# Patient Record
Sex: Female | Born: 1945 | Race: White | Hispanic: No | Smoking: Never smoker
Health system: Southern US, Community
[De-identification: ages and names within clinical notes are randomized; demographics above are authoritative.]

## PROBLEM LIST (undated history)

## (undated) DIAGNOSIS — M797 Fibromyalgia: Secondary | ICD-10-CM

## (undated) DIAGNOSIS — G250 Essential tremor: Secondary | ICD-10-CM

## (undated) DIAGNOSIS — T8859XA Other complications of anesthesia, initial encounter: Secondary | ICD-10-CM

## (undated) DIAGNOSIS — I4891 Unspecified atrial fibrillation: Secondary | ICD-10-CM

## (undated) DIAGNOSIS — E669 Obesity, unspecified: Secondary | ICD-10-CM

## (undated) DIAGNOSIS — I10 Essential (primary) hypertension: Secondary | ICD-10-CM

## (undated) DIAGNOSIS — E785 Hyperlipidemia, unspecified: Secondary | ICD-10-CM

## (undated) DIAGNOSIS — K449 Diaphragmatic hernia without obstruction or gangrene: Secondary | ICD-10-CM

## (undated) DIAGNOSIS — T4145XA Adverse effect of unspecified anesthetic, initial encounter: Secondary | ICD-10-CM

## (undated) DIAGNOSIS — K219 Gastro-esophageal reflux disease without esophagitis: Secondary | ICD-10-CM

## (undated) HISTORY — DX: Gastro-esophageal reflux disease without esophagitis: K21.9

## (undated) HISTORY — DX: Diaphragmatic hernia without obstruction or gangrene: K44.9

## (undated) HISTORY — DX: Essential tremor: G25.0

## (undated) HISTORY — DX: Hyperlipidemia, unspecified: E78.5

## (undated) HISTORY — DX: Essential (primary) hypertension: I10

## (undated) HISTORY — DX: Fibromyalgia: M79.7

## (undated) HISTORY — DX: Obesity, unspecified: E66.9

## (undated) HISTORY — PX: ABDOMINAL HYSTERECTOMY: SHX81

---

## 1999-03-22 ENCOUNTER — Other Ambulatory Visit: Admission: RE | Admit: 1999-03-22 | Discharge: 1999-03-22 | Payer: Self-pay | Admitting: Radiology

## 1999-03-22 ENCOUNTER — Encounter (INDEPENDENT_AMBULATORY_CARE_PROVIDER_SITE_OTHER): Payer: Self-pay | Admitting: Specialist

## 2001-09-30 ENCOUNTER — Other Ambulatory Visit: Admission: RE | Admit: 2001-09-30 | Discharge: 2001-09-30 | Payer: Self-pay | Admitting: Family Medicine

## 2001-10-06 ENCOUNTER — Encounter: Admission: RE | Admit: 2001-10-06 | Discharge: 2002-01-04 | Payer: Self-pay | Admitting: Family Medicine

## 2002-02-03 ENCOUNTER — Ambulatory Visit (HOSPITAL_COMMUNITY): Admission: RE | Admit: 2002-02-03 | Discharge: 2002-02-03 | Payer: Self-pay | Admitting: Family Medicine

## 2002-02-03 ENCOUNTER — Encounter: Payer: Self-pay | Admitting: Family Medicine

## 2002-02-07 ENCOUNTER — Encounter: Payer: Self-pay | Admitting: Family Medicine

## 2002-02-07 ENCOUNTER — Ambulatory Visit (HOSPITAL_COMMUNITY): Admission: RE | Admit: 2002-02-07 | Discharge: 2002-02-07 | Payer: Self-pay | Admitting: Family Medicine

## 2002-04-06 ENCOUNTER — Encounter: Admission: RE | Admit: 2002-04-06 | Discharge: 2002-05-05 | Payer: Self-pay | Admitting: Neurosurgery

## 2003-01-12 ENCOUNTER — Inpatient Hospital Stay (HOSPITAL_COMMUNITY): Admission: AD | Admit: 2003-01-12 | Discharge: 2003-01-14 | Payer: Self-pay | Admitting: Cardiology

## 2004-07-21 ENCOUNTER — Other Ambulatory Visit: Admission: RE | Admit: 2004-07-21 | Discharge: 2004-07-21 | Payer: Self-pay | Admitting: Family Medicine

## 2004-08-09 ENCOUNTER — Ambulatory Visit: Payer: Self-pay | Admitting: Cardiology

## 2004-08-24 ENCOUNTER — Ambulatory Visit: Payer: Self-pay | Admitting: Cardiology

## 2007-04-16 ENCOUNTER — Ambulatory Visit: Payer: Self-pay | Admitting: Cardiology

## 2007-10-31 ENCOUNTER — Ambulatory Visit: Payer: Self-pay | Admitting: Vascular Surgery

## 2008-01-30 ENCOUNTER — Ambulatory Visit: Payer: Self-pay | Admitting: Vascular Surgery

## 2008-02-25 ENCOUNTER — Ambulatory Visit: Payer: Self-pay | Admitting: Vascular Surgery

## 2008-03-01 ENCOUNTER — Ambulatory Visit: Payer: Self-pay | Admitting: Vascular Surgery

## 2008-04-30 ENCOUNTER — Ambulatory Visit: Payer: Self-pay | Admitting: Vascular Surgery

## 2008-12-01 ENCOUNTER — Ambulatory Visit: Payer: Self-pay | Admitting: Cardiology

## 2008-12-01 DIAGNOSIS — E669 Obesity, unspecified: Secondary | ICD-10-CM

## 2008-12-01 DIAGNOSIS — E785 Hyperlipidemia, unspecified: Secondary | ICD-10-CM

## 2008-12-01 DIAGNOSIS — R9431 Abnormal electrocardiogram [ECG] [EKG]: Secondary | ICD-10-CM | POA: Insufficient documentation

## 2008-12-01 DIAGNOSIS — I10 Essential (primary) hypertension: Secondary | ICD-10-CM | POA: Insufficient documentation

## 2009-10-19 ENCOUNTER — Ambulatory Visit: Payer: Self-pay | Admitting: Cardiology

## 2009-11-02 ENCOUNTER — Encounter: Payer: Self-pay | Admitting: Cardiology

## 2010-03-08 ENCOUNTER — Encounter: Payer: Self-pay | Admitting: Gastroenterology

## 2010-03-14 NOTE — Assessment & Plan Note (Signed)
Summary: [facility]   Visit Type:  Follow-up Primary Provider:  Dr. Christell Constant  CC:  chest pain.  History of Present Illness: The patient presents for evaluation of chest discomfort. She was at [facility] emergency room yesterday because of this. She has had an abnormal EKG in the past but had a negative stress perfusion study in 2004. This weekend she ate poorly including drinking orange juice and eating sausage. Early yesterday morning she developed chest discomfort. It is a burning discomfort. Her arms became known. She had some tachycardia and became anxious. She presented to the emergency room where she was not felt to be having an acute coronary event. She was not admitted. Since then prior to this time she's had no chest discomfort. She has been working at [employer] and walking for exercise. She cannot bring on chest pressure, neck or arm discomfort. She's had no palpitations, presyncope or syncope. She's had no PND or orthopnea.  Current Medications (verified): 1)  None  Allergies (verified): 1)  ! Penicillin 2)  ! Sulfa 3)  ! * Ivp Dye  Past History:  Past Medical History:  1. Fibromyalgia that is not currently under active treatment.  2. She has been told of GERD and a hiatal hernia in the past after upper     endoscopy.  She was treated with medical therapy for awhile, but this     eventually was discontinued. 3. Hypertension  4. Dyslipidemia  5. Obesity  Past Surgical History: Reviewed history from 12/01/2008 and no changes required. Hysterectomy.  Review of Systems       As stated in the HPI and negative for all other systems.   Vital Signs:  Patient profile:   65 year old female Height:      70 inches Weight:      212 pounds BMI:     30.53 Pulse rate:   69 / minute Resp:     16 per minute BP sitting:   122 / 76  (right arm)  Vitals Entered By: Marrion Coy, CNA (October 19, 2009 10:29 AM)  Physical Exam  General:  Well developed, well  nourished, in no acute distress. Head:  normocephalic and atraumatic Eyes:  PERRLA/EOM intact; conjunctiva and lids normal. Mouth:  Edentulousl. Oral mucosa normal. Neck:  Neck supple, no JVD. No masses, thyromegaly or abnormal cervical nodes. Chest Wall:  no deformities or breast masses noted Lungs:  Clear bilaterally to auscultation and percussion. Abdomen:  Bowel sounds positive; abdomen soft and non-tender without masses, organomegaly, or hernias noted. No hepatosplenomegaly. Msk:  Back normal, normal gait. Muscle strength and tone normal. Extremities:  No clubbing or cyanosis. Neurologic:  Alert and oriented x 3. Skin:  Intact without lesions or rashes. Cervical Nodes:  no significant adenopathy Inguinal Nodes:  no significant adenopathy Psych:  Normal affect.   Detailed Cardiovascular Exam  Neck    Carotids: Carotids full and equal bilaterally without bruits.      Neck Veins: Normal, no JVD.    Heart    Inspection: no deformities or lifts noted.      Palpation: normal PMI with no thrills palpable.      Auscultation: regular rate and rhythm, S1, S2 without murmurs, rubs, gallops, or clicks.    Vascular    Abdominal Aorta: no palpable masses, pulsations, or audible bruits.      Femoral Pulses: normal femoral pulses bilaterally.      Pedal Pulses: normal pedal pulses bilaterally.  Radial Pulses: normal radial pulses bilaterally.      Peripheral Circulation: no clubbing, cyanosis, or edema noted with normal capillary refill.     EKG  Procedure date:  10/19/2009  Findings:      Sinus rhythm, rate 69, axis within normal limits, intervals within normal limits, inferolateral T-wave inversions unchanged from previous EKGs.  Impression & Recommendations:  Problem # 1:  ABNORMAL ELECTROCARDIOGRAM (ICD-794.31) The patient continues to have an EKG that is abnormal and has been noted previously. However, she did have chest discomfort. Her mother had a micro-infarction in her  31s. Given this I would be extra vigilant. I think an exercise treadmill test is indicated. The patient will otherwise continue with risk reduction. Orders: EKG w/ Interpretation (93000)  Problem # 2:  OBESITY, UNSPECIFIED (ICD-278.00) She understands the need to lose weight with diet and exercise and we discussed this today.  Problem # 3:  HYPERTENSION, BENIGN (ICD-401.1) Her blood pressure is slightly elevated today. However, to the 120s over 70s at home. I asked her to keep a watch on this to make sure that it stays consistently in an acceptable range.  Problem # 4:  DYSLIPIDEMIA (ICD-272.4) She did have an LDL was elevated in the 130s. However, she had a very good response to diet and is currently being followed by Dr. Christell Constant.  Patient Instructions: 1)  Your physician recommends that you schedule a follow-up appointment as needed 2)  Your physician recommends that you continue on your current medications as directed. Please refer to the Current Medication list given to you today. 3)  Your physician has requested that you have an exercise tolerance test.  Please have this scheduled at [facility]

## 2010-03-14 NOTE — Letter (Signed)
Summary: [facility]   [facility]   Imported By: Marylou Mccoy 12/08/2009 08:39:20  _____________________________________________________________________  External Attachment:    Type:   Image     Comment:   External Document

## 2010-03-16 NOTE — Letter (Signed)
Summary: Colonoscopy Date Change Letter  [facility]  [address] [place], [place] [address]   Phone: [phone]  Fax: [phone]      March 08, 2010 MRN: [id number]   [facility] [address] [place], [place]  [address]   Dear Ms. Kwasny,   Previously you were recommended to have a repeat colonoscopy around this time. Your chart was recently reviewed by Dr. Claudette Head of [facility]. Follow up colonoscopy is now recommended in February 2015. This revised recommendation is based on current, nationally recognized guidelines for colorectal cancer screening and polyp surveillance. These guidelines are endorsed by the American Cancer Society, [employer] on Colorectal Cancer as well as numerous other major medical organizations.  Please understand that our recommendation assumes that you do not have any new symptoms such as bleeding, a change in bowel habits, anemia, or significant abdominal discomfort. If you do have any concerning GI symptoms or want to discuss the guideline recommendations, please call to arrange an office visit at your earliest convenience. Otherwise we will keep you in our reminder system and contact you 1-2 months prior to the date listed above to schedule your next colonoscopy.  Thank you,  Judie Petit T. Russella Dar, M.D.  [facility] [phone]

## 2010-06-27 NOTE — Assessment & Plan Note (Signed)
OFFICE VISIT   Alison Garrett, Alison Garrett  DOB:  Jun 11, 1945                                       03/01/2008  [id number]#:[id number]   [name] underwent laser ablation of the right great saphenous vein  with multiple stab phlebectomies by Dr. Colin Benton 1 week ago for painful  varicosities in the right thigh and calf.  She has done well following  the procedure.  She had some mild to moderate discomfort in the thigh at  the laser ablation site as one would expect and that is diminishing  daily.  Her biggest problem has been related to the elastic at the top  of the compression hose which she had a reaction to and developed some  skin breakdown which is now resolving.  She had no distal edema or pain  at the stab phlebectomy sites.  Duplex exam today reveals no evidence of  deep venous obstruction.  Great saphenous vein is occluded down to the  entrance site but there is some mild reflux at the junction into the  lateral branch.   I discussed this finding with her.  She will increase her activity as  tolerated.  Return to Dr. Colin Benton for final check in 2 months.   Alison Garrett Rochester, M.D.  Electronically Signed   JDL/MEDQ  D:  03/01/2008  T:  03/02/2008  Job:  [id number]

## 2010-06-27 NOTE — Procedures (Signed)
DUPLEX DEEP VENOUS EXAM - LOWER EXTREMITY   INDICATION:  Follow up right greater saphenous vein ablation.   HISTORY:  Edema:  Minimal in thigh.  Trauma/Surgery:  Right greater saphenous vein ablation with stab  phlebectomy on 02/25/2008 by Dr. Arbie Cookey.  Pain:  No.  PE:  No.  Previous DVT:  No.  Anticoagulants:  No.  Other:   DUPLEX EXAM:                CFV   SFV   PopV  PTV    GSV                R  L  R  L  R  L  R   L  R  L  Thrombosis    o  o  o     o     o      +  Spontaneous   +  +  +     +     +      0  Phasic        +  +  +     +     +      0  Augmentation  +  +  +     +     +      0  Compressible  +  +  +     +     +      0  Competent     D  +  +     +     +      0   Legend:  + - yes  o - no  p - partial  D - decreased   IMPRESSION:  1. No evidence of deep venous thrombosis in the right lower extremity      or the left common femoral vein.  2. Evidence of ablation without flow in the right greater saphenous      vein from the groin to distal insertion site.  3. Flow and reflux noted in the right saphenofemoral junction and      greater saphenous vein lateral branch.  4. Thrombosed varicosities noted near the medial aspect of the right      knee.    _____________________________  Quita Skye Hart Rochester, M.D.   AS/MEDQ  D:  03/01/2008  T:  03/01/2008  Job:  [id number]

## 2010-06-27 NOTE — Procedures (Signed)
LOWER EXTREMITY VENOUS REFLUX EXAM   INDICATION:  Right leg varicose vein with pain and swelling.   EXAM:  Using color-flow imaging and pulse Doppler spectral analysis, the  right common femoral vein, superficial femoral veins, popliteal,  posterior tibial, greater and lesser saphenous vein are evaluated.  There is evidence suggesting deep venous insufficiency in the right  lower extremity.   The right saphenofemoral junction is not competent.  The right GSV is  not competent, with the caliber as described below.   The right proximal short saphenous vein demonstrates competency.   GSV Diameter (used if found to be incompetent only)                                            Right    Left  Proximal Greater Saphenous Vein           0.58 cm  cm  Proximal-to-mid-thigh                     0.58 cm  cm  Mid thigh                                 0.59 cm  cm  Mid-distal thigh                          0.59 cm  cm  Distal thigh                              0.50 cm  cm  Knee                                      0.32 cm  cm   IMPRESSION:  1. The right greater saphenous vein reflux is identified from mid      thigh and below with a caliber ranging from 0.32 cm to 0.84 cm knee      to groin.  2. Reflux also noted in the right lateral branch of the greater      saphenous vein.  3. The right greater saphenous vein is not aneurysmal.  4. The right greater saphenous vein is not tortuous.  5. The right deep system is not competent.  6. The right lesser saphenous vein is competent.  7. No evidence of DVT noted in the right leg.       ___________________________________________  Larina Earthly, M.D.   MG/MEDQ  D:  01/30/2008  T:  01/30/2008  Job:  [id number]

## 2010-06-27 NOTE — Assessment & Plan Note (Signed)
OFFICE VISIT   SAVHANNA, SLIVA  DOB:  06-02-1945                                       04/30/2008  [id number]#:[id number]   The patient presents today for followup of her right great saphenous  vein laser ablation and stab phlebectomy in her thigh and calf.  The  procedure was on 02/25/2008.  Her postop duplex showed closure of her  saphenous vein 1 week following the procedure.  She is here today for  final followup.  She is quite pleased.  She reports no discomfort  associated with her leg.  She reports improvement in the swelling she  had had preprocedural.  Her phlebectomy sites all look quite good with  resolving subcutaneous fullness.  She is quite pleased with her result.  She will see [place] again on an as needed basis.   Larina Earthly, M.D.  Electronically Signed   TFE/MEDQ  D:  04/30/2008  T:  04/30/2008  Job:  [id number]

## 2010-06-27 NOTE — Assessment & Plan Note (Signed)
OFFICE VISIT   [name], [name]  DOB:  Apr 17, 1945                                       01/30/2008  [id number]#:[id number]   The patient presents today for continued follow-up of her severe venous  reflux and venous varicosities.  She reports that she has not had any  improvement wearing graduated compression garments.  She reports these  actually make things feel worse in her thigh than without wearing them.  She reports that the pain in her legs is interfering with exercise and  her daily walking.  She has had to decrease her frequency and length of  her walking regimen due to the pain.  She also exercises at a health  club and has had to decrease frequency secondary to leg pain.  She  reports that cooking and housework have become difficult due to pain and  swelling and also reports that that she works at [employer] and  this is also difficult due to leg pain and swelling.  She did undergo  formal duplex exam, this confirms reflux throughout her right great  saphenous vein that leads into the marked varicosities in her thigh and  calf.  I have explained the options to the patient.  I have recommended  that we proceed with laser ablation of her right great saphenous vein  and stab phlebectomy of multiple tributary varicosities over her thigh,  knee and calf region.  I explained this is an outpatient procedure in  our office.  I explained it is under local anesthesia.  She understands  and wishes to proceed when we can get her on our schedule.   Larina Earthly, M.D.  Electronically Signed   TFE/MEDQ  D:  01/30/2008  T:  02/02/2008  Job:  [id number]   cc:   Ernestina Penna, M.D.

## 2010-06-27 NOTE — Consult Note (Signed)
NEW PATIENT CONSULTATION   Alison Garrett, Alison S.  DOB:  06-May-1945                                       10/31/2007  [id number]#:[id number]   The patient presents today for evaluation of right leg venous  varicosities.  She is a very pleasant 65 year old white female with a  long history of varicosities over her right pretibial area.  These have  been progressive over time and she is now having increasing pain  associated with this.  She reports this occurs with prolonged standing  and is interfering with daily activities.  She does have a remote  history of what sounds like a ligation of iliac varix in her medial calf  10 years ago.  She denies any history of deep venous thrombosis.  She  does elevate her legs when possible and takes Tylenol over-the-counter  for pain associated with this.   PAST MEDICAL HISTORY:  Significant for myalgia, history atrial  fibrillation, history of hypertension.   SOCIAL HISTORY:  She does not smoke or drink alcohol.   Her weight is reported at 95 pounds.  She is 5 feet 10 inches tall.   REVIEW OF SYSTEMS:  Positive only for leg pain.   PHYSICAL EXAMINATION:  Well-developed, well-nourished white female  appearing stated age 69.  Blood pressure is 150/92 pulse 61,  respirations 18.  She does not have any significant varicosities her  left leg.  She does have marked varicosities in her medial right thigh  and her right pretibial area.   On duplex imaging this clearly arises from her saphenous vein with gross  reflux throughout her saphenous vein and her thigh.  We have discussed  the significance of her venous hypertension with the patient and fitted  her with graduated compression garments.  She was instructed on the  daily use of these and we will see her again in 3 months for continued  followup.  I did discuss the option of laser ablation and stab  phlebectomy for relief of her venous hypertension should conservative  methods of  compression fail.  We will see her again in 3 months with a  formal duplex at that time.   Larina Earthly, M.D.  Electronically Signed   TFE/MEDQ  D:  10/31/2007  T:  11/04/2007  Job:  [id number]   cc:   Ernestina Penna, M.D.

## 2010-06-30 NOTE — H&P (Signed)
Alison Garrett, Alison Garrett                       ACCOUNT NO.:  [id number]   MEDICAL RECORD NO.:  [id number]                   PATIENT TYPE:  INP   LOCATION:  3729                                 FACILITY:  [facility]   PHYSICIAN:  R[place]gway Bing, M.D.               DATE OF BIRTH:  05-28-1945   DATE OF ADMISSION:  01/12/2003  DATE OF DISCHARGE:                                HISTORY & PHYSICAL   REFERRING PHYSICIAN:  Charlesetta Shanks   PRIMARY CARDIOLOGIST:  Dr. Corinda Gubler.   HISTORY OF PRESENT ILLNESS:  A 65 year old woman presenting to her primary  care physician today with chest pain associated with dyspnea.  She was sent  by EMS to the [facility] Emergency Department.   Alison Garrett has a known history of coronary disease.  She experienced a  previous episode of fairly severe chest discomfort in 2001 that prompted an  outpatient Cardiolite study, that was apparently negative.  She experiences  intermittent chest discomfort and myalgias that she attributes to  fibromyalgia and to GERD.  This morning she awoke with mid substernal chest  pressure associated with fairly severe dyspnea.  Her discomfort was  exacerbated by low level activity as she went about her morning routine.  There was no associated nausea nor diaphoresis.  She could not attempt any  treatment at home but presented to her primary care physician, at which time  she had a minor dull residual ache that improved after sublingual  nitroglycerin.  She wonders whether the discomfort was due to GERD and the  dyspnea due to anxiety, which is a chronic problem for her.  She has a  history of hypertension.  She has been told of hyperlipidemia in the past.  She has had no diabetes.  Her lifestyle is sedentary.  She experiences  dyspnea with moderate exertion.   PAST MEDICAL HISTORY:  1. Fibromyalgia that is not currently under active treatment.  2. She has been told of GERD and a hiatal hernia in the past after upper     endoscopy.   She was treated with medical therapy for awhile, but this     eventually was discontinued.   PAST SURGICAL HISTORY:  Her only prior surgery was a hysterectomy.   MEDICATIONS:  Accupril 40 mg daily.  She has somewhat labile that is not  infrequently elevated, at least briefly.   ALLERGIES:  1. SULFA.  2. PENICILLIN.   SOCIAL HISTORY:  She lives in [place].  Unemployed and disabled with two  children.  She does not use tobacco nor alcohol products.   FAMILY HISTORY:  Mother is alive at age of 85 and has a history of coronary  disease.  She previously underwent CABG surgery.  Father died in his 56s due  to COPD.  None of her siblings have coronary disease.   REVIEW OF SYSTEMS:  Notable for occasional palpitations.  Chronic anxiety  with acute exacerbations, chronic  arthralgias.  All other systems negative.   PHYSICAL EXAMINATION:  GENERAL:  A well-appearing somewhat overweight woman.  VITAL SIGNS:  Temperature 98, heart rate 80, respirations 20, blood pressure  150/105.  O2 saturation on 96% on room air.  HEENT:  Nonicteric sclerae.  NECK: No jugular venous distention; no carotid bruits.  ENDOCRINE:  Mild thyromegaly.  HEMATOPOIETIC:  No adenopathy.  SKIN:  No significant lesions.  LUNGS:  Clear.  CARDIAC:  Normal first and second heart sounds; fourth heart sound present.  ABDOMEN:  Soft and nontender; no bruits; no organomegaly.  EXTREMITIES:  No edema; normal distal pulses.  NEUROMUSCULAR:  Symmetric strength and tone.  MUSCULOSKELETAL:  Full range of motion in all joints.   LABORATORY DATA:  Chest x-ray, EKG, and labs are pending.  EKGs from Dr.  Malen Gauze office were viewed.  Her tracing from 2001 is entirely normal.  The  tracings from today are of poorer technical quality with some baseline  drift.  There is some ST segment depression in the anterolateral leads.   IMPRESSION:  Alison Garrett has long standing symptoms that attributed to  fibromyalgia and gastroesophageal  reflux disease.  The quality of chest  discomfort and the associated dyspnea noted today were more consistent with  myocardial ischemia.  She did have minor electrocardiogram abnormalities  when she presented to her primary care physician's office, but these could  be due to tachycardia and hypertension.  Alison Garrett will be admitted for  serial cardiac markers and electrocardiograms.  If negative, we will proceed  with a stress Cardiolite study in the morning.  Metoprolol will be added to  her medical regime for better control of hypertension.  She may require  diuretic therapy as well.  A lipid profile and thyroid stimulating hormone  will be assessed.  We will treat her empirically with a PPI medication.                                                [place] Bing, M.D.    RR/MEDQ  D:  01/12/2003  T:  01/12/2003  Job:  [id number]

## 2010-06-30 NOTE — Discharge Summary (Signed)
Alison Garrett, Alison Garrett                       ACCOUNT NO.:  [id number]   MEDICAL RECORD NO.:  [id number]                   PATIENT TYPE:  INP   LOCATION:  3729                                 FACILITY:  [facility]   PHYSICIAN:  Maple Mirza, P.A.              DATE OF BIRTH:  [birth year]   DATE OF ADMISSION:  01/12/2003  DATE OF DISCHARGE:  01/14/2003                                 DISCHARGE SUMMARY   PRIMARY CAREGIVERS:  [facility] and [facility], [place], [place].   DISCHARGE DIAGNOSIS:  Chest discomfort with negative cardiac enzymes x3, non-  diagnostic electrocardiogram, resolving during this hospitalization after  medical treatment with Lopressor and Protonix.   SECONDARY DIAGNOSES:  1. Dyslipidemia.  2. Hypertension.  3. Family history of coronary artery disease.   PROCEDURES:  None.   DISCHARGE DISPOSITION:  The patient is ready for discharge January 14, 2003.  She has maintained sinus rhythm this hospitalization.  She has had no  cardiac dysrhythmias, no respiratory compromise.  Once again, cardiac  enzymes were taken, troponin I negative x3.  Electrocardiogram shows sinus  rhythm with no ST-T changes, no ST depressions.  Patient has been chest pain  free this hospitalization.  She is scheduled for exercise Cardiolite at  [facility] on January 14, 2003, at 7:15 in the morning so she will  discharge today on:   1. An enteric-coated aspirin 81 mg daily.  2. Accupril 40 mg daily, she takes this already.  3. Protonix 40 mg daily, new medication.  4. Lopressor 50 mg one-half tab three times daily, breakfast, lunch, dinner;     also new medication.  5. She was to be started on Zocor but the patient wishes to try a six-week     trial of exercise, diet and then to have her lipid studies re-taken.   DISCHARGE DIET:  Low-sodium, low-cholesterol.   PLAN:  Present to [facility] at 7:15 in the morning of January 14, 2003, for a  stress Cardiolite.  Patient is ready for discharge January 14, 2003.   BRIEF HISTORY:  This is a 65 year old female.  She has no known history of  coronary artery disease.  She presented to her primary caregiver on January 12, 2003, complaining of chest pain.  She was referred to [facility] and was admitted through the emergency room.  The patient  apparently woke at 5 o'clock in the morning of January 12, 2003, with chest  pain which increased with activity.  The  pain was rated a 6/10.  She did  have increasing dyspnea, no nausea or vomiting, no diaphoresis.  She took a  sublingual nitroglycerin which decreased the pain, this was given at her  family practitioner's office.  [facility].  She does complain of some burning but this is more  consistent with  her experience of reflux.  She had earlier symptoms similar  to angina attacks.  Prior evaluation in 2001 or 2002 was for the same  symptoms.  Apparently, this study was negative.  She says she has a diagnosis of  fibromyalgia which is poorly controlled.  She also has a history of  gastroesophageal reflux disease with hiatal hernia.   Once again, the patient is discharged today to present to [facility]  for a stress Cardiolite study.                                                Maple Mirza, P.A.    GM/MEDQ  D:  01/14/2003  T:  01/14/2003  Job:  [id number]   cc:   Cecil Cranker, M.D.

## 2010-09-07 ENCOUNTER — Encounter: Payer: Self-pay | Admitting: Cardiology

## 2010-09-19 ENCOUNTER — Encounter: Payer: Self-pay | Admitting: Cardiology

## 2011-04-11 ENCOUNTER — Ambulatory Visit: Payer: Medicare Other | Attending: Family Medicine | Admitting: Physical Therapy

## 2011-04-11 DIAGNOSIS — IMO0001 Reserved for inherently not codable concepts without codable children: Secondary | ICD-10-CM | POA: Insufficient documentation

## 2011-04-11 DIAGNOSIS — M542 Cervicalgia: Secondary | ICD-10-CM | POA: Insufficient documentation

## 2011-04-11 DIAGNOSIS — M545 Low back pain, unspecified: Secondary | ICD-10-CM | POA: Insufficient documentation

## 2011-04-11 DIAGNOSIS — M25559 Pain in unspecified hip: Secondary | ICD-10-CM | POA: Insufficient documentation

## 2011-04-11 DIAGNOSIS — R5381 Other malaise: Secondary | ICD-10-CM | POA: Insufficient documentation

## 2011-04-17 ENCOUNTER — Ambulatory Visit: Payer: Medicare Other | Attending: Family Medicine | Admitting: *Deleted

## 2011-04-17 DIAGNOSIS — M542 Cervicalgia: Secondary | ICD-10-CM | POA: Insufficient documentation

## 2011-04-17 DIAGNOSIS — R5381 Other malaise: Secondary | ICD-10-CM | POA: Insufficient documentation

## 2011-04-17 DIAGNOSIS — IMO0001 Reserved for inherently not codable concepts without codable children: Secondary | ICD-10-CM | POA: Insufficient documentation

## 2011-04-17 DIAGNOSIS — M545 Low back pain, unspecified: Secondary | ICD-10-CM | POA: Insufficient documentation

## 2011-04-17 DIAGNOSIS — M25559 Pain in unspecified hip: Secondary | ICD-10-CM | POA: Insufficient documentation

## 2011-04-19 ENCOUNTER — Ambulatory Visit: Payer: Medicare Other | Admitting: *Deleted

## 2011-04-24 ENCOUNTER — Ambulatory Visit: Payer: Medicare Other | Admitting: *Deleted

## 2011-04-26 ENCOUNTER — Ambulatory Visit: Payer: Medicare Other | Admitting: *Deleted

## 2011-05-01 ENCOUNTER — Encounter: Payer: Medicare Other | Admitting: *Deleted

## 2011-05-03 ENCOUNTER — Ambulatory Visit: Payer: Medicare Other | Admitting: *Deleted

## 2011-05-08 ENCOUNTER — Ambulatory Visit: Payer: Medicare Other | Admitting: *Deleted

## 2011-05-10 ENCOUNTER — Ambulatory Visit: Payer: Medicare Other | Admitting: *Deleted

## 2011-05-15 ENCOUNTER — Ambulatory Visit: Payer: Medicare Other | Attending: Family Medicine | Admitting: Physical Therapy

## 2011-05-15 DIAGNOSIS — M542 Cervicalgia: Secondary | ICD-10-CM | POA: Insufficient documentation

## 2011-05-15 DIAGNOSIS — R5381 Other malaise: Secondary | ICD-10-CM | POA: Insufficient documentation

## 2011-05-15 DIAGNOSIS — M545 Low back pain, unspecified: Secondary | ICD-10-CM | POA: Insufficient documentation

## 2011-05-15 DIAGNOSIS — IMO0001 Reserved for inherently not codable concepts without codable children: Secondary | ICD-10-CM | POA: Insufficient documentation

## 2011-05-15 DIAGNOSIS — M25559 Pain in unspecified hip: Secondary | ICD-10-CM | POA: Insufficient documentation

## 2011-05-16 ENCOUNTER — Ambulatory Visit: Payer: Medicare Other | Admitting: Physical Therapy

## 2011-05-22 ENCOUNTER — Encounter: Payer: Medicare Other | Admitting: Physical Therapy

## 2011-05-23 ENCOUNTER — Ambulatory Visit: Payer: Medicare Other | Admitting: Physical Therapy

## 2011-05-30 ENCOUNTER — Ambulatory Visit: Payer: Medicare Other | Admitting: Physical Therapy

## 2011-06-07 ENCOUNTER — Encounter: Payer: Self-pay | Admitting: Cardiology

## 2011-06-07 ENCOUNTER — Other Ambulatory Visit: Payer: Self-pay | Admitting: Cardiology

## 2011-06-07 DIAGNOSIS — I4891 Unspecified atrial fibrillation: Secondary | ICD-10-CM

## 2011-06-20 ENCOUNTER — Other Ambulatory Visit: Payer: Medicare Other

## 2011-07-11 ENCOUNTER — Encounter: Payer: Self-pay | Admitting: Cardiology

## 2011-07-11 ENCOUNTER — Ambulatory Visit (INDEPENDENT_AMBULATORY_CARE_PROVIDER_SITE_OTHER): Payer: Medicare Other | Admitting: Cardiology

## 2011-07-11 VITALS — BP 142/100 | HR 67 | Ht 70.0 in | Wt 215.0 lb

## 2011-07-11 DIAGNOSIS — I480 Paroxysmal atrial fibrillation: Secondary | ICD-10-CM | POA: Insufficient documentation

## 2011-07-11 DIAGNOSIS — I10 Essential (primary) hypertension: Secondary | ICD-10-CM

## 2011-07-11 DIAGNOSIS — I4891 Unspecified atrial fibrillation: Secondary | ICD-10-CM

## 2011-07-11 NOTE — Patient Instructions (Signed)
The current medical regimen is effective;  continue present plan and medications.  Your physician has requested that you have an exercise tolerance test. For further information please visit [email or web address] Please also follow instruction sheet, as given.  Your physician has requested that you have an echocardiogram. Echocardiography is a painless test that uses sound waves to create images of your heart. It provides your doctor with information about the size and shape of your heart and how well your heart's chambers and valves are working. This procedure takes approximately one hour. There are no restrictions for this procedure.  Follow up with Dr Antoine Poche in [place] in 3 months.

## 2011-07-11 NOTE — Progress Notes (Signed)
   HPI The patient presents for evaluation of atrial fibrillation. I have seen her in the past for evaluation of atypical chest pain. She has had a negative stress test several years ago. She did have an episode of atrial fibrillation about 7 years ago.  On April 25 she was in bed and developed a rapid heart rate. She moves atrial fibrillation which was confirmed [place].  She subsequently converted to sinus rhythm and was observed overnight. She's had no further palpitations, presyncope or syncope. She has had no chest pressure, neck or arm discomfort. He's had no shortness of breath, PND or orthopnea. She doesn't exercise but she does have an active job and has no limitations with this.  Of note the patient was seen in consultation by Dr. Andee Lineman.  She was given a prescription for Cardizem to take when necessary and follow up was suggested with [place].  Allergies  Allergen Reactions  . Penicillins   . Sulfonamide Derivatives     No current outpatient prescriptions on file.    Past Medical History  Diagnosis Date  . Fibromyalgia     Not currently under active treatment  . GERD (gastroesophageal reflux disease)   . Hiatal hernia   . Hypertension   . Dyslipidemia   . Obesity     Past Surgical History  Procedure Date  . Abdominal hysterectomy     ROS:  As stated in the HPI and negative for all other systems.  PHYSICAL EXAM BP 142/100  Pulse 67  Ht 5\' 10"  (1.778 m)  Wt 215 lb (97.523 kg)  BMI 30.85 kg/m2 GENERAL:  Well appearing HEENT:  Pupils equal round and reactive, fundi not visualized, oral mucosa unremarkable NECK:  No jugular venous distention, waveform within normal limits, carotid upstroke brisk and symmetric, no bruits, no thyromegaly LYMPHATICS:  No cervical, inguinal adenopathy LUNGS:  Clear to auscultation bilaterally BACK:  No CVA tenderness CHEST:  Unremarkable HEART:  PMI not displaced or sustained,S1 and S2 within normal limits, no S3, no S4, no clicks, no  rubs, no murmurs ABD:  Flat, positive bowel sounds normal in frequency in pitch, no bruits, no rebound, no guarding, no midline pulsatile mass, no hepatomegaly, no splenomegaly EXT:  2 plus pulses throughout, no edema, no cyanosis no clubbing SKIN:  No rashes no nodules NEURO:  Cranial nerves II through XII grossly intact, motor grossly intact throughout PSYCH:  Cognitively intact, oriented to person place and time    EKG:  Sinus rhythm, rate 67, axis within normal limits, intervals within normal limits, no acute ST-T wave changes.  ASSESSMENT AND PLAN

## 2011-07-11 NOTE — Assessment & Plan Note (Signed)
The blood pressure is at target. No change in medications is indicated. We will continue with therapeutic lifestyle changes (TLC).  

## 2011-07-11 NOTE — Assessment & Plan Note (Signed)
Ms. ZASHA BELLEAU has a CHA2DS2 - VASc score of 1 with a risk of stroke of 1.3%  and a HAS - BLED score of 1.with one validation study suggesting a risk of 1.02 bleeds per 100 patient years.  Therefore, anticoagulation is not indicated. She can take the Cardizem as needed. I would likely also give her eventually flecainide as a pill in pocket. However, she needs an echocardiogram and stress test first to make sure she still has a structurally normal heart.  She also understands that the first time she takes this should be in the emergency room.

## 2011-07-18 ENCOUNTER — Ambulatory Visit (HOSPITAL_COMMUNITY): Payer: Medicare Other | Attending: Cardiovascular Disease | Admitting: Radiology

## 2011-07-18 ENCOUNTER — Ambulatory Visit (INDEPENDENT_AMBULATORY_CARE_PROVIDER_SITE_OTHER): Payer: Medicare Other | Admitting: Cardiology

## 2011-07-18 ENCOUNTER — Encounter: Payer: Self-pay | Admitting: Cardiology

## 2011-07-18 DIAGNOSIS — I4891 Unspecified atrial fibrillation: Secondary | ICD-10-CM | POA: Insufficient documentation

## 2011-07-18 DIAGNOSIS — E785 Hyperlipidemia, unspecified: Secondary | ICD-10-CM | POA: Insufficient documentation

## 2011-07-18 NOTE — Progress Notes (Signed)
Echocardiogram performed.  

## 2011-07-18 NOTE — Procedures (Signed)
Exercise Treadmill Test  Pre-Exercise Testing Evaluation Rhythm: normal sinus  Rate: 68   PR:  .16 QRS:  .09  QT:  .40 QTc: .42     Test  Exercise Tolerance Test Ordering MD: Angelina Sheriff, MD  Interpreting MD: Angelina Sheriff, MD  Unique Test No: 1  Treadmill:  1  Indication for ETT: A-Fib  Contraindication to ETT: No   Stress Modality: exercise - treadmill  Cardiac Imaging Performed: non   Protocol: standard Bruce - maximal  Max BP:  166/74  Max MPHR (bpm):  156 85% MPR (bpm):  132  MPHR obtained (bpm):  141 % MPHR obtained:  90  Reached 85% MPHR (min:sec):  5:15 Total Exercise Time (min-sec):  6:15  Workload in METS:  7.1 Borg Scale: 17  Reason ETT Terminated:  desired heart rate attained    ST Segment Analysis At Rest: normal ST segments - no evidence of significant ST depression With Exercise: no evidence of significant ST depression  Other Information Arrhythmia:  No Angina during ETT:  absent (0) Quality of ETT:  diagnostic  ETT Interpretation:  normal - no evidence of ischemia by ST analysis  Comments: The patient had an poor exercise tolerance.  There was no chest pain.  There was an appropriate level of dyspnea.  There were no arrhythmias, a normal heart rate response and normal BP response.  There were no ischemic ST T wave changes and a normal heart rate recovery.   Recommendations: Negative adequate ETT.  No further testing is indicated.  Based on the above I gave the patient a prescription for exercise.  Based on this and the normal echocardiogram, the patient would be appropriate for flecainide "pill in pocket" if she has atrial fibrillation in the future.  This flecainide bolus should first be administered in the ER for observation afterward.  The patient has been given this instruction.

## 2011-07-31 ENCOUNTER — Encounter: Payer: Self-pay | Admitting: Cardiology

## 2011-11-15 ENCOUNTER — Encounter: Payer: Self-pay | Admitting: Cardiology

## 2012-06-04 ENCOUNTER — Other Ambulatory Visit (INDEPENDENT_AMBULATORY_CARE_PROVIDER_SITE_OTHER): Payer: Medicare Other

## 2012-06-04 DIAGNOSIS — Z0289 Encounter for other administrative examinations: Secondary | ICD-10-CM

## 2012-06-04 DIAGNOSIS — E785 Hyperlipidemia, unspecified: Secondary | ICD-10-CM

## 2012-06-04 DIAGNOSIS — I10 Essential (primary) hypertension: Secondary | ICD-10-CM

## 2012-06-04 DIAGNOSIS — R5383 Other fatigue: Secondary | ICD-10-CM

## 2012-06-04 DIAGNOSIS — E559 Vitamin D deficiency, unspecified: Secondary | ICD-10-CM

## 2012-06-04 DIAGNOSIS — R5381 Other malaise: Secondary | ICD-10-CM

## 2012-06-04 LAB — HEPATIC FUNCTION PANEL
ALT: 24 U/L (ref 0–35)
Albumin: 4.1 g/dL (ref 3.5–5.2)
Alkaline Phosphatase: 85 U/L (ref 39–117)
Indirect Bilirubin: 0.6 mg/dL (ref 0.0–0.9)
Total Protein: 7.1 g/dL (ref 6.0–8.3)

## 2012-06-04 LAB — THYROID PANEL WITH TSH: TSH: 1.636 u[IU]/mL (ref 0.350–4.500)

## 2012-06-04 LAB — BASIC METABOLIC PANEL
CO2: 29 mEq/L (ref 19–32)
Calcium: 9.3 mg/dL (ref 8.4–10.5)
Chloride: 107 mEq/L (ref 96–112)
Creat: 0.99 mg/dL (ref 0.50–1.10)
Glucose, Bld: 97 mg/dL (ref 70–99)

## 2012-06-04 LAB — POCT CBC
Lymph, poc: 1.8 (ref 0.6–3.4)
MCH, POC: 29.1 pg (ref 27–31.2)
MPV: 8.1 fL (ref 0–99.8)
POC LYMPH PERCENT: 25.1 %L (ref 10–50)
Platelet Count, POC: 229 10*3/uL (ref 142–424)
RBC: 5.2 M/uL (ref 4.04–5.48)
RDW, POC: 13 %
WBC: 7 10*3/uL (ref 4.6–10.2)

## 2012-06-04 NOTE — Progress Notes (Signed)
Patient here today for labs only. °

## 2012-06-05 LAB — NMR LIPOPROFILE WITH LIPIDS
HDL Particle Number: 26.6 umol/L — ABNORMAL LOW (ref >=30.5)
HDL Size: 8.7 nm — ABNORMAL LOW (ref >=9.2)
HDL-C: 41 mg/dL (ref >=40)
LDL (calc): 110 mg/dL — ABNORMAL HIGH (ref <100)
LDL Size: 20.4 nm — ABNORMAL LOW (ref >20.5)
Large HDL-P: 2.8 umol/L — ABNORMAL LOW (ref >=4.8)
Small LDL Particle Number: 793 nmol/L — ABNORMAL HIGH (ref <=527)

## 2012-06-05 LAB — VITAMIN D 25 HYDROXY (VIT D DEFICIENCY, FRACTURES): Vit D, 25-Hydroxy: 21 ng/mL — ABNORMAL LOW (ref 30–89)

## 2012-06-10 ENCOUNTER — Other Ambulatory Visit: Payer: Self-pay | Admitting: *Deleted

## 2012-06-10 DIAGNOSIS — E559 Vitamin D deficiency, unspecified: Secondary | ICD-10-CM

## 2012-06-10 MED ORDER — ERGOCALCIFEROL 1.25 MG (50000 UT) PO CAPS: 50000.0000 [IU] | ORAL_CAPSULE | ORAL | Status: DC

## 2012-06-10 NOTE — Progress Notes (Signed)
Left message on home voicemail to return call.

## 2012-07-23 ENCOUNTER — Ambulatory Visit (INDEPENDENT_AMBULATORY_CARE_PROVIDER_SITE_OTHER): Payer: Medicare Other | Admitting: Family Medicine

## 2012-07-23 ENCOUNTER — Encounter: Payer: Self-pay | Admitting: Family Medicine

## 2012-07-23 ENCOUNTER — Ambulatory Visit (INDEPENDENT_AMBULATORY_CARE_PROVIDER_SITE_OTHER): Payer: Medicare Other

## 2012-07-23 VITALS — BP 165/95 | HR 84 | Temp 97.0°F | Ht 68.5 in | Wt 219.8 lb

## 2012-07-23 DIAGNOSIS — M25522 Pain in left elbow: Secondary | ICD-10-CM

## 2012-07-23 DIAGNOSIS — M25529 Pain in unspecified elbow: Secondary | ICD-10-CM

## 2012-07-23 DIAGNOSIS — I10 Essential (primary) hypertension: Secondary | ICD-10-CM

## 2012-07-23 DIAGNOSIS — R51 Headache: Secondary | ICD-10-CM

## 2012-07-23 DIAGNOSIS — R42 Dizziness and giddiness: Secondary | ICD-10-CM

## 2012-07-23 DIAGNOSIS — E785 Hyperlipidemia, unspecified: Secondary | ICD-10-CM

## 2012-07-23 MED ORDER — QUINAPRIL HCL 20 MG PO TABS: 20.0000 mg | ORAL_TABLET | Freq: Every day | ORAL | Status: DC

## 2012-07-23 NOTE — Patient Instructions (Addendum)
Continue current meds and therapeutic lifestyle changes Start new medication for blood pressure which will be called into the drug store. Return to clinic in 4 weeks, recheck blood pressure, recheck BMP, recheck elbow pain. Get tennis elbow brace use as directed. Use warm wet compresses on elbows. Can try some ibuprofen 200 mg 1  3 times a day after meal, illicit bother stomach or cause his blood pressure to go up Check blood pressures more regularly at home Watch sodium intake Do the best possible with losing more weight, i.e. Exercise and diet

## 2012-07-23 NOTE — Progress Notes (Addendum)
Subjective:    Patient ID: Alison Garrett, female    DOB: 12/06/1945, 67 y.o.   MRN: [id number]  HPI Patient comes in today for followup of chronic medical problems which include fibromyalgia, hyperlipidemia, and hypertension. As below she is also complaining of some dizziness for the past week which has been intermittent. Also some headaches. As a note on health maintenance patient refuses to take the Pneumovax. Patient also complains of numbness in both hands and pain in both elbows left being greater than right   Review of Systems  Constitutional: Positive for fatigue.  Eyes: Negative.   Respiratory: Negative.   Cardiovascular: Negative.   Gastrointestinal: Negative.   Endocrine: Negative.   Genitourinary: Negative.   Musculoskeletal: Positive for myalgias (fibromyalgia), back pain (LBP) and arthralgias (all over).  Skin: Negative.   Neurological: Positive for dizziness (x 1 week, intermitent) and headaches (slight  pressure, temples).  Psychiatric/Behavioral: Negative.        Objective:   Physical Exam BP 165/95  Pulse 84  Temp(Src) 97 F (36.1 C) (Oral)  Ht 5' 8.5" (1.74 m)  Wt 219 lb 12.8 oz (99.701 kg)  BMI 32.93 kg/m2  Repeat blood pressure this morning was 154/100  The patient appeared well nourished and normally developed, alert and oriented to time and place. Speech, behavior and judgement appear normal. Vital signs as documented.  Head exam is unremarkable. No scleral icterus or pallor noted. Some nasal congestion bilaterally. Prominent tonsils bilaterally. TMs were normal.  Neck is without jugular venous distension, thyromegally, or carotid bruits. Carotid upstrokes are brisk bilaterally. No cervical adenopathy. Lungs are clear anteriorly and posteriorly to auscultation. Normal respiratory effort. Cardiac exam reveals regular rate and rhythm at 72 per minute. First and second heart sounds normal.  No murmurs, rubs or gallops.  Abdominal exam reveals normal bowl  sounds, no masses, no organomegaly and no aortic enlargement. No inguinal adenopathy. There is no tenderness. Extremities are nonedematous and both femoral and pedal pulses are normal. There is tenderness at both lateral epicondyles in the upper extremity. There were good radial pulses bilaterally. Skin without pallor or jaundice.  Warm and dry, without rash. Neurologic exam reveals normal deep tendon reflexes and normal sensation. Reflexes were normal and equal in both upper and lower extremities.  [facility] reading (PRIMARY) by  Dr.Moore; left elbow --within normal limits                                       Assessment & Plan:  1.  hypertension - quinapril (ACCUPRIL) 20 MG tablet; Take 1 tablet (20 mg total) by mouth at bedtime.  Dispense: 30 tablet; Refill: 11  2.  hyperlipidemia Labs will be done the end of July  3. Dizziness -Will followup blood pressure after taking Accupril in 1 month  4. Headache(784.0) Possibly secondary to elevated blood pressure  5. Elbow pain, left - DG Elbow 2 Views Left; Future  Patient Instructions  Continue current meds and therapeutic lifestyle changes Start new medication for blood pressure which will be called into the drug store. Return to clinic in 4 weeks, recheck blood pressure, recheck BMP, recheck elbow pain. Get tennis elbow brace use as directed. Use warm wet compresses on elbows. Can try some ibuprofen 200 mg 1  3 times a day after meal, illicit bother stomach or cause his blood pressure to go up Check blood pressures more regularly at home  Watch sodium intake Do the best possible with losing more weight, i.e. Exercise and diet   Use tennis elbow brace as directed

## 2012-07-24 ENCOUNTER — Encounter: Payer: Self-pay | Admitting: *Deleted

## 2012-08-20 ENCOUNTER — Ambulatory Visit: Payer: Medicare Other | Admitting: Family Medicine

## 2012-08-28 ENCOUNTER — Ambulatory Visit: Payer: Medicare Other | Admitting: Family Medicine

## 2012-08-28 ENCOUNTER — Telehealth: Payer: Self-pay | Admitting: Family Medicine

## 2012-08-28 NOTE — Telephone Encounter (Signed)
appt given for 8/6 with dr. Christell Constant

## 2012-09-17 ENCOUNTER — Encounter: Payer: Self-pay | Admitting: Family Medicine

## 2012-09-17 ENCOUNTER — Ambulatory Visit (INDEPENDENT_AMBULATORY_CARE_PROVIDER_SITE_OTHER): Payer: Medicare Other | Admitting: Family Medicine

## 2012-09-17 VITALS — BP 131/80 | HR 80 | Temp 96.9°F | Ht 68.5 in | Wt 220.2 lb

## 2012-09-17 DIAGNOSIS — E785 Hyperlipidemia, unspecified: Secondary | ICD-10-CM

## 2012-09-17 DIAGNOSIS — E559 Vitamin D deficiency, unspecified: Secondary | ICD-10-CM | POA: Insufficient documentation

## 2012-09-17 DIAGNOSIS — I10 Essential (primary) hypertension: Secondary | ICD-10-CM

## 2012-09-17 DIAGNOSIS — M797 Fibromyalgia: Secondary | ICD-10-CM | POA: Insufficient documentation

## 2012-09-17 DIAGNOSIS — IMO0001 Reserved for inherently not codable concepts without codable children: Secondary | ICD-10-CM

## 2012-09-17 NOTE — Patient Instructions (Addendum)
Fall precautions discussed Continue current meds and therapeutic lifestyle changes Keep appointment for mammogram Remember to get your flu shot beginning in late September through the fall season.

## 2012-09-17 NOTE — Progress Notes (Signed)
Subjective:    Patient ID: Alison Garrett, female    DOB: 01/30/1946, 67 y.o.   MRN: [id number]  HPI So the office today for followup and management of chronic medical problems. These include hypertension vitamin D deficiency, hyperlipidemia, and fibromyalgia. It is important to note that this patient has a history of allergies to multiple medications. Also noteworthy is that her mother died of heart disease. She is up-to-date on her health maintenance parameters except for the Pneumovax in shingles shots which she prefers not to take do to her multiple allergies problem. At the last visit her blood pressure was very elevated and she was placed on Accupril 10 mg, which is a medicine she taken in the past and had tolerated. She started back on this medicine and it seemed to bother her stomach causing her to feel very nauseated and at the same time caused neck pain. After 2 and half weeks she stopped the medication. She felt better immediately. Since that time she been checking blood pressures at home and they've been running 1:30 to 140/70-80. She is not having as many headaches or dizziness at this point in time since her blood pressure has been lower. She has been eating much less salt and is exercising more than she was previously. Despite doing that as her weight has not changed any.   Review of Systems  Constitutional: Positive for activity change (increased) and fatigue (slight).  HENT: Negative.  Negative for ear pain, congestion, sore throat, sneezing, postnasal drip and sinus pressure.   Eyes: Negative.  Negative for photophobia, pain, discharge, redness, itching and visual disturbance.  Respiratory: Negative.  Negative for cough, choking, chest tightness, shortness of breath and wheezing.   Cardiovascular: Negative.  Negative for chest pain.  Gastrointestinal: Negative.  Negative for nausea, vomiting, abdominal pain, diarrhea and constipation.  Genitourinary: Negative.  Negative for dysuria,  frequency, hematuria, vaginal bleeding, vaginal discharge, vaginal pain and pelvic pain.  Musculoskeletal: Positive for myalgias (all over due to fibromyalgia), back pain (LBP) and arthralgias (all over, worse shoulders, legs and hips).  Skin: Negative.  Negative for color change, pallor, rash and wound.  Allergic/Immunologic: Negative.  Negative for environmental allergies, food allergies and immunocompromised state.  Neurological: Negative for dizziness, tremors, syncope, weakness, light-headedness, numbness and headaches.  Psychiatric/Behavioral: Negative.  Negative for confusion, sleep disturbance and agitation. The patient is not nervous/anxious.        Objective:   Physical Exam  Vitals reviewed. Constitutional: She is oriented to person, place, and time. She appears well-developed and well-nourished. No distress.  HENT:  Head: Normocephalic and atraumatic.  Eyes: Conjunctivae are normal.  Cardiovascular: Normal rate, regular rhythm and normal heart sounds.  Exam reveals no gallop and no friction rub.   No murmur heard. At 72 per minute  Pulmonary/Chest: Breath sounds normal. She is in respiratory distress. She has no wheezes. She has no rales.  Abdominal: Soft. Bowel sounds are normal. She exhibits mass. She exhibits no distension. There is no tenderness. There is no guarding.  Musculoskeletal: Normal range of motion. She exhibits no edema.  Neurological: She is alert and oriented to person, place, and time. She has normal reflexes.  Skin: Skin is warm and dry. She is not diaphoretic.  Psychiatric: She has a normal mood and affect. Her behavior is normal. Judgment and thought content normal.           Assessment & Plan:  Hypertension  Hyperlipemia  Vitamin D deficiency  Fibromyalgia  Patient Instructions  Fall precautions discussed Continue current meds and therapeutic lifestyle changes Keep appointment for mammogram Remember to get your flu shot beginning in late  September through the fall season.     Continue to monitor blood pressures regularly at home Continue to watch sodium intake Continue regular walking Nyra Capes MD

## 2013-01-03 ENCOUNTER — Telehealth: Payer: Self-pay | Admitting: Pharmacist

## 2013-01-03 NOTE — Telephone Encounter (Signed)
Patient called to schedule Annual Wellness Visit - left message on VM

## 2013-01-23 ENCOUNTER — Encounter: Payer: Self-pay | Admitting: Gastroenterology

## 2013-02-18 ENCOUNTER — Encounter: Payer: Self-pay | Admitting: Family Medicine

## 2013-02-18 ENCOUNTER — Ambulatory Visit (INDEPENDENT_AMBULATORY_CARE_PROVIDER_SITE_OTHER): Payer: Medicare Other | Admitting: Family Medicine

## 2013-02-18 VITALS — BP 165/90 | HR 73 | Temp 96.7°F | Ht 68.5 in | Wt 219.0 lb

## 2013-02-18 DIAGNOSIS — E559 Vitamin D deficiency, unspecified: Secondary | ICD-10-CM

## 2013-02-18 DIAGNOSIS — I10 Essential (primary) hypertension: Secondary | ICD-10-CM

## 2013-02-18 DIAGNOSIS — IMO0001 Reserved for inherently not codable concepts without codable children: Secondary | ICD-10-CM

## 2013-02-18 DIAGNOSIS — I999 Unspecified disorder of circulatory system: Secondary | ICD-10-CM

## 2013-02-18 DIAGNOSIS — E785 Hyperlipidemia, unspecified: Secondary | ICD-10-CM

## 2013-02-18 DIAGNOSIS — E8881 Metabolic syndrome: Secondary | ICD-10-CM | POA: Insufficient documentation

## 2013-02-18 DIAGNOSIS — I4891 Unspecified atrial fibrillation: Secondary | ICD-10-CM

## 2013-02-18 DIAGNOSIS — M797 Fibromyalgia: Secondary | ICD-10-CM

## 2013-02-18 LAB — POCT CBC
GRANULOCYTE PERCENT: 66.7 % (ref 37–80)
HCT, POC: 46.6 % (ref 37.7–47.9)
HEMOGLOBIN: 14.7 g/dL (ref 12.2–16.2)
Lymph, poc: 2.1 (ref 0.6–3.4)
MCH: 27.6 pg (ref 27–31.2)
MCHC: 31.6 g/dL — AB (ref 31.8–35.4)
MCV: 87.1 fL (ref 80–97)
MPV: 8.2 fL (ref 0–99.8)
POC Granulocyte: 4.6 (ref 2–6.9)
POC LYMPH PERCENT: 30.2 %L (ref 10–50)
Platelet Count, POC: 226 10*3/uL (ref 142–424)
RBC: 5.3 M/uL (ref 4.04–5.48)
RDW, POC: 12.6 %
WBC: 6.9 10*3/uL (ref 4.6–10.2)

## 2013-02-18 LAB — POCT GLYCOSYLATED HEMOGLOBIN (HGB A1C): HEMOGLOBIN A1C: 5.5

## 2013-02-18 NOTE — Progress Notes (Signed)
Subjective:    Patient ID: Alison Garrett, female    DOB: 07-01-1945, 68 y.o.   MRN: [id number]  HPI Pt here for follow up and management of chronic medical problems. The patient indicates that she's been doing well problem she continues to have problems with her fibromyalgia. She monitors her blood pressure readings at home and promises me that they are in the 135 range over 80-90 range. She does not want to do flu shots Prevnar are shingles shots because she is fearful of having a drug reaction to these. She exercises regularly walking about 1-1/4 miles 5 times weekly with her sister.       Patient Active Problem List   Diagnosis Date Noted  . Hypertension 09/17/2012  . Hyperlipemia 09/17/2012  . Vitamin D deficiency 09/17/2012  . Fibromyalgia 09/17/2012  . Atrial fibrillation 07/11/2011  . DYSLIPIDEMIA 12/01/2008  . OBESITY, UNSPECIFIED 12/01/2008  . HYPERTENSION, BENIGN 12/01/2008  . ABNORMAL ELECTROCARDIOGRAM 12/01/2008   Outpatient Encounter Prescriptions as of 02/18/2013  Medication Sig  . aspirin EC 81 MG tablet Take 81 mg by mouth daily.  . cholecalciferol (VITAMIN D) 1000 UNITS tablet Take 1,000 Units by mouth daily.  . [DISCONTINUED] ergocalciferol (VITAMIN D2) 50000 UNITS capsule Take 1 capsule (50,000 Units total) by mouth once a week.    Review of Systems  Constitutional: Negative.   HENT: Negative.   Eyes: Negative.   Respiratory: Negative.   Cardiovascular: Negative.   Gastrointestinal: Negative.   Endocrine: Negative.   Genitourinary: Negative.   Musculoskeletal: Negative.   Skin: Negative.   Allergic/Immunologic: Negative.   Neurological: Negative.   Hematological: Negative.   Psychiatric/Behavioral: Negative.        Objective:   Physical Exam  Nursing note and vitals reviewed. Constitutional: She is oriented to person, place, and time. She appears well-developed and well-nourished. No distress.  HENT:  Head: Normocephalic and atraumatic.  Right  Ear: External ear normal.  Left Ear: External ear normal.  Mouth/Throat: Oropharynx is clear and moist. No oropharyngeal exudate.  Nasal congestion bilaterally and prominent tonsils in the posterior throat  Eyes: Conjunctivae and EOM are normal. Pupils are equal, round, and reactive to light. Right eye exhibits no discharge. Left eye exhibits no discharge. No scleral icterus.  Neck: Normal range of motion. Neck supple. No thyromegaly present.  Cardiovascular: Normal rate, regular rhythm, normal heart sounds and intact distal pulses.  Exam reveals no gallop and no friction rub.   No murmur heard. At 72 per minute  Pulmonary/Chest: Effort normal and breath sounds normal. No respiratory distress. She has no wheezes. She has no rales. She exhibits no tenderness.  Abdominal: Soft. Bowel sounds are normal. She exhibits no distension and no mass. There is no tenderness. There is no rebound and no guarding.  Musculoskeletal: Normal range of motion. She exhibits no edema and no tenderness.  Lymphadenopathy:    She has no cervical adenopathy.  Neurological: She is alert and oriented to person, place, and time. She has normal reflexes.  Skin on feet was dry but sensation was normal bilaterally  Skin: Skin is warm and dry.  Dry skin on feet  Psychiatric: She has a normal mood and affect. Her behavior is normal. Judgment and thought content normal.   BP 165/90  Pulse 73  Temp(Src) 96.7 F (35.9 C) (Oral)  Ht 5' 8.5" (1.74 m)  Wt 219 lb (99.338 kg)  BMI 32.81 kg/m2        Assessment & Plan:  1. Vitamin D deficiency - POCT CBC - Vit D  25 hydroxy (rtn osteoporosis monitoring)  2. Hypertension - POCT CBC - BMP8+EGFR - Hepatic function panel  3. Hyperlipemia - POCT CBC - NMR, lipoprofile  4. Atrial fibrillation - POCT CBC  5. Vitamin D deficiency - POCT CBC - Vit D  25 hydroxy (rtn osteoporosis monitoring)  6. Vascular lesion - Ambulatory referral to Dermatology  7. Metabolic  syndrome - POCT glycosylated hemoglobin (Hb A1C)  8. Fibromyalgia   Patient Instructions  Continue current medications. Continue good therapeutic lifestyle changes which include good diet and exercise. Continue the walking that you're already doing Fall precautions discussed with patient. Schedule your flu vaccine if you haven't had it yet If you are over 33 years old - you may need Prevnar 66 or the adult Pneumonia vaccine. We will do a referral to the dermatologist for the vascular lesion on her nose  Do not forget to get your mammogram Do not forget to call and schedule your colonoscopy Bring  blood pressure readings in about 4 weeks for review Bring blood pressure readings to every visit so we can confirm that they have been doing well at home    Arrie Senate MD

## 2013-02-18 NOTE — Patient Instructions (Addendum)
Continue current medications. Continue good therapeutic lifestyle changes which include good diet and exercise. Continue the walking that you're already doing Fall precautions discussed with patient. Schedule your flu vaccine if you haven't had it yet If you are over 67 years old - you may need Prevnar 7 or the adult Pneumonia vaccine. We will do a referral to the dermatologist for the vascular lesion on her nose  Do not forget to get your mammogram Do not forget to call and schedule your colonoscopy Bring  blood pressure readings in about 4 weeks for review Bring blood pressure readings to every visit so we can confirm that they have been doing well at home

## 2013-02-19 LAB — BMP8+EGFR
BUN/Creatinine Ratio: 15 (ref 11–26)
BUN: 14 mg/dL (ref 8–27)
CO2: 26 mmol/L (ref 18–29)
Calcium: 9.3 mg/dL (ref 8.6–10.2)
Chloride: 101 mmol/L (ref 97–108)
Creatinine, Ser: 0.92 mg/dL (ref 0.57–1.00)
GFR calc Af Amer: 75 mL/min/{1.73_m2} (ref >59)
GFR, EST NON AFRICAN AMERICAN: 65 mL/min/{1.73_m2} (ref >59)
Glucose: 87 mg/dL (ref 65–99)
Potassium: 5 mmol/L (ref 3.5–5.2)
SODIUM: 142 mmol/L (ref 134–144)

## 2013-02-19 LAB — NMR, LIPOPROFILE
Cholesterol: 185 mg/dL (ref <200)
HDL Cholesterol by NMR: 43 mg/dL (ref >=40)
HDL PARTICLE NUMBER: 27.6 umol/L — AB (ref >=30.5)
LDL Particle Number: 1915 nmol/L — ABNORMAL HIGH (ref <1000)
LDL Size: 20.4 nm — ABNORMAL LOW (ref >20.5)
LDLC SERPL CALC-MCNC: 121 mg/dL — AB (ref <100)
LP-IR Score: 57 — ABNORMAL HIGH (ref <=45)
Small LDL Particle Number: 1200 nmol/L — ABNORMAL HIGH (ref <=527)
TRIGLYCERIDES BY NMR: 104 mg/dL (ref <150)

## 2013-02-19 LAB — VITAMIN D 25 HYDROXY (VIT D DEFICIENCY, FRACTURES): Vit D, 25-Hydroxy: 18.1 ng/mL — ABNORMAL LOW (ref 30.0–100.0)

## 2013-02-19 LAB — HEPATIC FUNCTION PANEL
ALT: 20 IU/L (ref 0–32)
AST: 23 IU/L (ref 0–40)
Albumin: 4.1 g/dL (ref 3.6–4.8)
Alkaline Phosphatase: 90 IU/L (ref 39–117)
BILIRUBIN DIRECT: 0.15 mg/dL (ref 0.00–0.40)
BILIRUBIN TOTAL: 0.6 mg/dL (ref 0.0–1.2)
Total Protein: 7.1 g/dL (ref 6.0–8.5)

## 2013-02-23 ENCOUNTER — Telehealth: Payer: Self-pay | Admitting: Family Medicine

## 2013-02-23 NOTE — Telephone Encounter (Signed)
Message copied by Waverly Ferrari on Mon Feb 23, 2013  4:31 PM ------      Message from: Chipper Herb      Created: Thu Feb 19, 2013  9:31 PM       The blood sugar is good. Kidney function tests and electrolytes including potassium are within normal limits.      All liver function tests are within normal limits      The vitamin D level is very low-------- please call and a prescription for vitamin D 50,000 units #12, one weekly for 12 weeks with one refill. Recheck vitamin D in 3 months      : Advanced lipid testing, a total LDL particle number is elevated at 1915, previously it was 1513 . The LDL C. is elevated at 121. Triglycerides are good. The good cholesterol the HDL particle number is low.----------- the patient is statin intolerant. She must continue to exercise and watch her diet and aggressive way. ------

## 2013-02-24 NOTE — Telephone Encounter (Signed)
Pt aware of lab results.  rs °

## 2013-02-25 ENCOUNTER — Telehealth: Payer: Self-pay | Admitting: Family Medicine

## 2013-03-12 ENCOUNTER — Telehealth: Payer: Self-pay | Admitting: Family Medicine

## 2013-03-12 MED ORDER — OLMESARTAN MEDOXOMIL 20 MG PO TABS: 20.0000 mg | ORAL_TABLET | Freq: Every day | ORAL | Status: DC

## 2013-03-12 NOTE — Telephone Encounter (Signed)
Please see last visit note.

## 2013-03-13 NOTE — Telephone Encounter (Signed)
Patient aware.

## 2013-06-18 ENCOUNTER — Encounter: Payer: Self-pay | Admitting: Family Medicine

## 2013-06-18 ENCOUNTER — Ambulatory Visit (INDEPENDENT_AMBULATORY_CARE_PROVIDER_SITE_OTHER): Payer: Medicare Other | Admitting: Family Medicine

## 2013-06-18 VITALS — BP 168/97 | HR 76 | Temp 96.6°F | Ht 69.25 in | Wt 224.0 lb

## 2013-06-18 DIAGNOSIS — I4891 Unspecified atrial fibrillation: Secondary | ICD-10-CM

## 2013-06-18 DIAGNOSIS — I10 Essential (primary) hypertension: Secondary | ICD-10-CM

## 2013-06-18 DIAGNOSIS — E8881 Metabolic syndrome: Secondary | ICD-10-CM

## 2013-06-18 DIAGNOSIS — J309 Allergic rhinitis, unspecified: Secondary | ICD-10-CM

## 2013-06-18 DIAGNOSIS — E559 Vitamin D deficiency, unspecified: Secondary | ICD-10-CM

## 2013-06-18 DIAGNOSIS — E785 Hyperlipidemia, unspecified: Secondary | ICD-10-CM

## 2013-06-18 LAB — POCT CBC
GRANULOCYTE PERCENT: 70.7 % (ref 37–80)
HCT, POC: 43.3 % (ref 37.7–47.9)
HEMOGLOBIN: 14.2 g/dL (ref 12.2–16.2)
Lymph, poc: 1.6 (ref 0.6–3.4)
MCH, POC: 28.5 pg (ref 27–31.2)
MCHC: 32.8 g/dL (ref 31.8–35.4)
MCV: 87 fL (ref 80–97)
MPV: 8.2 fL (ref 0–99.8)
POC GRANULOCYTE: 4.3 (ref 2–6.9)
POC LYMPH PERCENT: 26.9 %L (ref 10–50)
Platelet Count, POC: 215 10*3/uL (ref 142–424)
RBC: 5 M/uL (ref 4.04–5.48)
RDW, POC: 12.6 %
WBC: 6.1 10*3/uL (ref 4.6–10.2)

## 2013-06-18 MED ORDER — HYDROCHLOROTHIAZIDE 25 MG PO TABS: 25.0000 mg | ORAL_TABLET | Freq: Every day | ORAL | Status: DC

## 2013-06-18 NOTE — Progress Notes (Signed)
Subjective:    Patient ID: Alison Garrett, female    DOB: June 12, 1945, 68 y.o.   MRN: [id number]  HPI Pt here for follow up and management of chronic medical problems. The patient comes in today with no complaints as usual . She is only taking an 81 mg enteric-coated aspirin. She indicates that her outside blood pressures always good compared to the office. He blood pressures have been up recently. She was recently started on Benicar but stopped this after 2 weeks to do to a rash. She has a history of multiple drug allergy issues. She indicates that about 2 months ago she was in the hospital at [facility] for 2-3 days for a workup for her chest discomfort. She says the cardiac workup was negative. The GI workup with an ultrasound was also negative. No findings were found explaining her chest discomfort. We will arrange to get lab work today and she will be given an FOBT to return. She will be given an appointment with one of the mid levels for a pelvic exam and Pap smear. She needs to call and schedule her colonoscopy.         Patient Active Problem List   Diagnosis Date Noted  . Metabolic syndrome 99/83/3825  . Hypertension 09/17/2012  . Hyperlipemia 09/17/2012  . Vitamin D deficiency 09/17/2012  . Fibromyalgia 09/17/2012  . Atrial fibrillation 07/11/2011  . DYSLIPIDEMIA 12/01/2008  . OBESITY, UNSPECIFIED 12/01/2008  . HYPERTENSION, BENIGN 12/01/2008  . ABNORMAL ELECTROCARDIOGRAM 12/01/2008   Outpatient Encounter Prescriptions as of 06/18/2013  Medication Sig  . aspirin EC 81 MG tablet Take 81 mg by mouth daily.  . [DISCONTINUED] cholecalciferol (VITAMIN D) 1000 UNITS tablet Take 1,000 Units by mouth daily.  . [DISCONTINUED] olmesartan (BENICAR) 20 MG tablet Take 1 tablet (20 mg total) by mouth daily.    Review of Systems  Constitutional: Negative.   HENT: Negative.   Eyes: Negative.   Respiratory: Negative.   Cardiovascular: Negative.   Gastrointestinal: Negative.   Endocrine:  Negative.   Genitourinary: Negative.   Musculoskeletal: Negative.   Skin: Negative.   Allergic/Immunologic: Negative.   Neurological: Negative.   Hematological: Negative.   Psychiatric/Behavioral: Negative.        Objective:   Physical Exam  Nursing note and vitals reviewed. Constitutional: She is oriented to person, place, and time. She appears well-developed and well-nourished. No distress.  HENT:  Head: Normocephalic and atraumatic.  Right Ear: External ear normal.  Left Ear: External ear normal.  Mouth/Throat: Oropharynx is clear and moist. No oropharyngeal exudate.  Nasal turbinate congestion bilaterally  Eyes: Conjunctivae and EOM are normal. Pupils are equal, round, and reactive to light. Right eye exhibits no discharge. Left eye exhibits no discharge. No scleral icterus.  Neck: Normal range of motion. Neck supple. No thyromegaly present.  No carotid bruits  Cardiovascular: Normal rate, regular rhythm, normal heart sounds and intact distal pulses.  Exam reveals no gallop and no friction rub.   No murmur heard.  At 72 per minute  Pulmonary/Chest: Effort normal and breath sounds normal. No respiratory distress. She has no wheezes. She has no rales. She exhibits no tenderness.  Abdominal: Soft. Bowel sounds are normal. She exhibits no mass. There is no tenderness. There is no rebound and no guarding.  Obesity, generalized abdominal tenderness no specific location  Musculoskeletal: Normal range of motion. She exhibits no edema and no tenderness.  Lymphadenopathy:    She has no cervical adenopathy.  Neurological: She is alert and  oriented to person, place, and time. She has normal reflexes. No cranial nerve deficit.  Skin: Skin is warm and dry. No rash noted.  Psychiatric: She has a normal mood and affect. Her behavior is normal. Judgment and thought content normal.   BP 168/97  Pulse 76  Temp(Src) 96.6 F (35.9 C) (Oral)  Ht 5' 9.25" (1.759 m)  Wt 224 lb (101.606 kg)   BMI 32.84 kg/m2        Assessment & Plan:  1. Atrial fibrillation - POCT CBC  2. Hyperlipemia - POCT CBC - NMR, lipoprofile  3. Hypertension - POCT CBC - BMP8+EGFR - Hepatic function panel - BMP8+EGFR; Future  4. Metabolic syndrome - POCT CBC - POCT glycosylated hemoglobin (Hb A1C)  5. Vitamin D deficiency - POCT CBC - Vit D  25 hydroxy (rtn osteoporosis monitoring)  6. Allergic rhinitis  Meds ordered this encounter  Medications  . hydrochlorothiazide (HYDRODIURIL) 25 MG tablet    Sig: Take 1 tablet (25 mg total) by mouth daily.    Dispense:  90 tablet    Refill:  3   Patient Instructions                       Medicare Annual Wellness Visit  [facility] and the medical providers at [facility] strive to bring you the best medical care.  In doing so we not only want to address your current medical conditions and concerns but also to detect new conditions early and prevent illness, disease and health-related problems.    Medicare offers a yearly Wellness Visit which allows our clinical staff to assess your need for preventative services including immunizations, lifestyle education, counseling to decrease risk of preventable diseases and screening for fall risk and other medical concerns.    This visit is provided free of charge (no copay) for all Medicare recipients. The clinical pharmacists at [facility] have begun to conduct these Wellness Visits which will also include a thorough review of all your medications.    As you primary medical provider recommend that you make an appointment for your Annual Wellness Visit if you have not done so already this year.  You may set up this appointment before you leave today or you may call back ([phone]) and schedule an appointment.  Please make sure when you call that you mention that you are scheduling your Annual Wellness Visit with the clinical pharmacist so that the appointment  may be made for the proper length of time.      Continue current medications. Continue good therapeutic lifestyle changes which include good diet and exercise. Fall precautions discussed with patient. If an FOBT was given today- please return it to our front desk. If you are over 52 years old - you may need Prevnar 23 or the adult Pneumonia vaccine.  Will schedule you an appointment with a cardiologist Take your blood pressure/fluid pill 1 daily return to clinic in a couple of weeks and get a BMP. Brain home blood pressure readings. When you return to the clinic in a couple of weeks she will see the clinical pharmacists. Continue to exercise Continue to watch sodium in your diet Nasacort or Flonase over-the-counter 1-2 sprays each nostril at bedtime for allergic rhinitis   Arrie Senate MD

## 2013-06-18 NOTE — Patient Instructions (Addendum)
Medicare Annual Wellness Visit  [facility] and the medical providers at [facility] strive to bring you the best medical care.  In doing so we not only want to address your current medical conditions and concerns but also to detect new conditions early and prevent illness, disease and health-related problems.    Medicare offers a yearly Wellness Visit which allows our clinical staff to assess your need for preventative services including immunizations, lifestyle education, counseling to decrease risk of preventable diseases and screening for fall risk and other medical concerns.    This visit is provided free of charge (no copay) for all Medicare recipients. The clinical pharmacists at [facility] have begun to conduct these Wellness Visits which will also include a thorough review of all your medications.    As you primary medical provider recommend that you make an appointment for your Annual Wellness Visit if you have not done so already this year.  You may set up this appointment before you leave today or you may call back ([phone]) and schedule an appointment.  Please make sure when you call that you mention that you are scheduling your Annual Wellness Visit with the clinical pharmacist so that the appointment may be made for the proper length of time.      Continue current medications. Continue good therapeutic lifestyle changes which include good diet and exercise. Fall precautions discussed with patient. If an FOBT was given today- please return it to our front desk. If you are over 52 years old - you may need Prevnar 42 or the adult Pneumonia vaccine.  Will schedule you an appointment with a cardiologist Take your blood pressure/fluid pill 1 daily return to clinic in a couple of weeks and get a BMP. Brain home blood pressure readings. When you return to the clinic in a couple of weeks she will see the clinical  pharmacists. Continue to exercise Continue to watch sodium in your diet Nasacort or Flonase over-the-counter 1-2 sprays each nostril at bedtime for allergic rhinitis

## 2013-06-19 LAB — BMP8+EGFR
BUN/Creatinine Ratio: 15 (ref 11–26)
BUN: 16 mg/dL (ref 8–27)
CO2: 28 mmol/L (ref 18–29)
Calcium: 9.1 mg/dL (ref 8.7–10.3)
Chloride: 104 mmol/L (ref 97–108)
Creatinine, Ser: 1.04 mg/dL — ABNORMAL HIGH (ref 0.57–1.00)
GFR calc Af Amer: 65 mL/min/{1.73_m2} (ref >59)
GFR calc non Af Amer: 56 mL/min/{1.73_m2} — ABNORMAL LOW (ref >59)
Glucose: 98 mg/dL (ref 65–99)
POTASSIUM: 4.6 mmol/L (ref 3.5–5.2)
SODIUM: 141 mmol/L (ref 134–144)

## 2013-06-19 LAB — NMR, LIPOPROFILE
Cholesterol: 169 mg/dL (ref <200)
HDL Cholesterol by NMR: 41 mg/dL (ref >=40)
HDL Particle Number: 28.2 umol/L — ABNORMAL LOW (ref >=30.5)
LDL PARTICLE NUMBER: 1337 nmol/L — AB (ref <1000)
LDL SIZE: 20.6 nm (ref >20.5)
LDLC SERPL CALC-MCNC: 105 mg/dL — ABNORMAL HIGH (ref <100)
LP-IR SCORE: 57 — AB (ref <=45)
Small LDL Particle Number: 668 nmol/L — ABNORMAL HIGH (ref <=527)
Triglycerides by NMR: 114 mg/dL (ref <150)

## 2013-06-19 LAB — HEPATIC FUNCTION PANEL
ALK PHOS: 95 IU/L (ref 39–117)
ALT: 22 IU/L (ref 0–32)
AST: 24 IU/L (ref 0–40)
Albumin: 4 g/dL (ref 3.6–4.8)
BILIRUBIN DIRECT: 0.16 mg/dL (ref 0.00–0.40)
Total Bilirubin: 0.8 mg/dL (ref 0.0–1.2)
Total Protein: 6.8 g/dL (ref 6.0–8.5)

## 2013-06-19 LAB — VITAMIN D 25 HYDROXY (VIT D DEFICIENCY, FRACTURES): VIT D 25 HYDROXY: 19.6 ng/mL — AB (ref 30.0–100.0)

## 2013-06-22 ENCOUNTER — Telehealth: Payer: Self-pay | Admitting: Family Medicine

## 2013-06-22 NOTE — Telephone Encounter (Signed)
Message copied by Waverly Ferrari on Mon Jun 22, 2013 10:21 AM ------      Message from: Chipper Herb      Created: Fri Jun 19, 2013  5:23 PM       The blood sugar is normal at 98. The creatinine, the most important kidney function tests is very slightly elevated and we will continue to monitor this. The electrolytes including potassium are within normal limit      All of the liver function tests are within normal limit      Advanced lipid testing, a total LDL particle number is elevated at 1337. This is lower than it has been in the past. The goal for this number is less than 1000. The LDL C. is one of 5 and this is also lower than it has been in the past. Triglycerides are good, the HDL particle number or the good cholesterol is low.----- continue aggressive therapeutic lifestyle changes      The vitamin D level remains low as it has been in the past.------------ please confirm with the patient whether or not she can tolerate taking vitamin D. If she can, please call prescription in for 50,000 units #12 one weekly one refill ------

## 2013-06-23 NOTE — Telephone Encounter (Signed)
Spoke with patient. She wants to try to increase her vit d level through diet before she trys an rx since she does have problems with some meds. She will call back if she has any further questions.

## 2013-07-02 ENCOUNTER — Ambulatory Visit: Payer: Medicare Other

## 2013-07-15 ENCOUNTER — Ambulatory Visit: Payer: Medicare Other

## 2013-07-31 ENCOUNTER — Ambulatory Visit (INDEPENDENT_AMBULATORY_CARE_PROVIDER_SITE_OTHER): Payer: Medicare Other | Admitting: Pharmacist

## 2013-07-31 ENCOUNTER — Encounter: Payer: Self-pay | Admitting: Pharmacist

## 2013-07-31 VITALS — BP 120/82 | HR 78 | Ht 69.25 in | Wt 223.8 lb

## 2013-07-31 DIAGNOSIS — Z1212 Encounter for screening for malignant neoplasm of rectum: Secondary | ICD-10-CM

## 2013-07-31 DIAGNOSIS — I10 Essential (primary) hypertension: Secondary | ICD-10-CM

## 2013-07-31 DIAGNOSIS — E559 Vitamin D deficiency, unspecified: Secondary | ICD-10-CM

## 2013-07-31 MED ORDER — VITAMIN D 400 UNITS PO CAPS: 400.0000 [IU] | ORAL_CAPSULE | Freq: Every day | ORAL | Status: DC

## 2013-07-31 NOTE — Patient Instructions (Signed)
DASH Eating Plan  DASH stands for "Dietary Approaches to Stop Hypertension." The DASH eating plan is a healthy eating plan that has been shown to reduce high blood pressure (hypertension). Additional health benefits may include reducing the risk of type 2 diabetes mellitus, heart disease, and stroke. The DASH eating plan may also help with weight loss.  WHAT DO I NEED TO KNOW ABOUT THE DASH EATING PLAN?  For the DASH eating plan, you will follow these general guidelines:  · Choose foods with a percent daily value for sodium of less than 5% (as listed on the food label).  · Use salt-free seasonings or herbs instead of table salt or sea salt.  · Check with your health care provider or pharmacist before using salt substitutes.  · Eat lower-sodium products, often labeled as "lower sodium" or "no salt added."  · Eat fresh foods.  · Eat more vegetables, fruits, and low-fat dairy products.  · Choose whole grains. Look for the word "whole" as the first word in the ingredient list.  · Choose fish and skinless chicken or turkey more often than red meat. Limit fish, poultry, and meat to 6 oz (170 g) each day.  · Limit sweets, desserts, sugars, and sugary drinks.  · Choose heart-healthy fats.  · Limit cheese to 1 oz (28 g) per day.  · Eat more home-cooked food and less restaurant, buffet, and fast food.  · Limit fried foods.  · Cook foods using methods other than frying.  · Limit canned vegetables. If you do use them, rinse them well to decrease the sodium.  · When eating at a restaurant, ask that your food be prepared with less salt, or no salt if possible.  WHAT FOODS CAN I EAT?  Seek help from a dietitian for individual calorie needs.  Grains  Whole grain or whole wheat bread. Brown rice. Whole grain or whole wheat pasta. Quinoa, bulgur, and whole grain cereals. Low-sodium cereals. Corn or whole wheat flour tortillas. Whole grain cornbread. Whole grain crackers. Low-sodium crackers.  Vegetables  Fresh or frozen vegetables  (raw, steamed, roasted, or grilled). Low-sodium or reduced-sodium tomato and vegetable juices. Low-sodium or reduced-sodium tomato sauce and paste. Low-sodium or reduced-sodium canned vegetables.   Fruits  All fresh, canned (in natural juice), or frozen fruits.  Meat and Other Protein Products  Ground beef (85% or leaner), grass-fed beef, or beef trimmed of fat. Skinless chicken or turkey. Ground chicken or turkey. Pork trimmed of fat. All fish and seafood. Eggs. Dried beans, peas, or lentils. Unsalted nuts and seeds. Unsalted canned beans.  Dairy  Low-fat dairy products, such as skim or 1% milk, 2% or reduced-fat cheeses, low-fat ricotta or cottage cheese, or plain low-fat yogurt. Low-sodium or reduced-sodium cheeses.  Fats and Oils  Tub margarines without trans fats. Light or reduced-fat mayonnaise and salad dressings (reduced sodium). Avocado. Safflower, olive, or canola oils. Natural peanut or almond butter.  Other  Unsalted popcorn and pretzels.  The items listed above may not be a complete list of recommended foods or beverages. Contact your dietitian for more options.  WHAT FOODS ARE NOT RECOMMENDED?  Grains  White bread. White pasta. White rice. Refined cornbread. Bagels and croissants. Crackers that contain trans fat.  Vegetables  Creamed or fried vegetables. Vegetables in a cheese sauce. Regular canned vegetables. Regular canned tomato sauce and paste. Regular tomato and vegetable juices.  Fruits  Dried fruits. Canned fruit in light or heavy syrup. Fruit juice.  Meat and Other Protein   Products  Fatty cuts of meat. Ribs, chicken wings, bacon, sausage, bologna, salami, chitterlings, fatback, hot dogs, bratwurst, and packaged luncheon meats. Salted nuts and seeds. Canned beans with salt.  Dairy  Whole or 2% milk, cream, half-and-half, and cream cheese. Whole-fat or sweetened yogurt. Full-fat cheeses or blue cheese. Nondairy creamers and whipped toppings. Processed cheese, cheese spreads, or cheese  curds.  Condiments  Onion and garlic salt, seasoned salt, table salt, and sea salt. Canned and packaged gravies. Worcestershire sauce. Tartar sauce. Barbecue sauce. Teriyaki sauce. Soy sauce, including reduced sodium. Steak sauce. Fish sauce. Oyster sauce. Cocktail sauce. Horseradish. Ketchup and mustard. Meat flavorings and tenderizers. Bouillon cubes. Hot sauce. Tabasco sauce. Marinades. Taco seasonings. Relishes.  Fats and Oils  Butter, stick margarine, lard, shortening, ghee, and bacon fat. Coconut, palm kernel, or palm oils. Regular salad dressings.  Other  Pickles and olives. Salted popcorn and pretzels.  The items listed above may not be a complete list of foods and beverages to avoid. Contact your dietitian for more information.  WHERE CAN I FIND MORE INFORMATION?  National Heart, Lung, and Blood Institute: www.nhlbi.nih.gov/health/health-topics/topics/dash/  Document Released: 01/18/2011 Document Revised: 02/03/2013 Document Reviewed: 12/03/2012  ExitCare® Patient Information ©2015 [employer]. This information is not intended to replace advice given to you by your health care provider. Make sure you discuss any questions you have with your health care provider.

## 2013-07-31 NOTE — Progress Notes (Signed)
Subjective:    Patient here for follow-up of elevated blood pressure.  Alison Garrett is  68 yo female. She was see about 6 weeks ago by Dr Laurance Flatten and started HCTZ 25mg  1 tablet daily.  She complains today the she has not "felt good" and has general malaise since starting HCTZ.  She also reports cramping at night.  Her BP readings prior to HCTZ were in 170's/90's but since starting HCTZ per patient home BP readings 120's / 70's.  She has been recording BP but did not remember to bring in to today's visit.    She is exercising and is adherent to a low-salt diet.  Blood pressure is well controlled at home. Cardiac symptoms: fatigue. Patient denies: chest pain and claudication. Cardiovascular risk factors: advanced age (older than 68 for men, 70 for women), hypertension and obesity (BMI >= 30 kg/m2). Use of agents associated with hypertension: none. History of target organ damage: none.  Patient also had low vitamin D at last check 06/18/13 but she has not started Rx vitamin D 50,000IU because she is afraid of side effects.  She was been trying to increase food with vitamin D such as milk and dark leafy greens  The following portions of the patient's history were reviewed and updated as appropriate: allergies, current medications, past family history, past medical history, past social history, past surgical history and problem list.   Objective:   Filed Vitals:   07/31/13 1608  BP: 120/82  Pulse: 78   Filed Weights   07/31/13 1608  Weight: 223 lb 12 oz (101.492 kg)   Body mass index is 32.8 kg/(m^2).   Assessment:    Hypertension, normal blood pressure much improved since starting HCTZ but concerned about reported side effects patient is expereiencing. Vitamin D deficiency - patient is non compliant with recommended therapy History of sensitivity  / allergies to multiple medications   Plan:    Medication: dosage change HCTZ 25mg  decreased to 1/2 tablets - check BP daily if increases to above  140/90 then restart 1 tablet daily. Screening labs for initial evaluation: basic metabolic panel. Regular aerobic exercise. Check blood pressures one times daily and record. Follow up: 6 weeks and as needed. Discussed DASH diet and information given.  Patient to start OTC Vitamin D 400IU daily  Cherre Robins, PharmD, CPP

## 2013-07-31 NOTE — Addendum Note (Signed)
Addended by: Earlene Plater on: 07/31/2013 04:26 PM   Modules accepted: Orders

## 2013-08-01 LAB — BMP8+EGFR
BUN / CREAT RATIO: 17 (ref 11–26)
BUN: 18 mg/dL (ref 8–27)
CHLORIDE: 96 mmol/L — AB (ref 97–108)
CO2: 28 mmol/L (ref 18–29)
Calcium: 9.7 mg/dL (ref 8.7–10.3)
Creatinine, Ser: 1.04 mg/dL — ABNORMAL HIGH (ref 0.57–1.00)
GFR calc non Af Amer: 56 mL/min/{1.73_m2} — ABNORMAL LOW (ref >59)
GFR, EST AFRICAN AMERICAN: 65 mL/min/{1.73_m2} (ref >59)
Glucose: 135 mg/dL — ABNORMAL HIGH (ref 65–99)
Potassium: 4 mmol/L (ref 3.5–5.2)
Sodium: 138 mmol/L (ref 134–144)

## 2013-08-02 LAB — FECAL OCCULT BLOOD, IMMUNOCHEMICAL: Fecal Occult Bld: NEGATIVE

## 2013-08-26 ENCOUNTER — Ambulatory Visit (INDEPENDENT_AMBULATORY_CARE_PROVIDER_SITE_OTHER): Payer: Medicare Other | Admitting: Cardiology

## 2013-08-26 ENCOUNTER — Encounter: Payer: Self-pay | Admitting: Cardiology

## 2013-08-26 VITALS — BP 147/100 | HR 90 | Ht 69.5 in | Wt 209.0 lb

## 2013-08-26 DIAGNOSIS — R9431 Abnormal electrocardiogram [ECG] [EKG]: Secondary | ICD-10-CM

## 2013-08-26 NOTE — Progress Notes (Signed)
HPI The patient presents for evaluation of atrial fibrillation. She is this in the past couple of years since the last visit. There are about 8 years between events. She's had an unremarkable echo.  Dr. Dannielle Burn saw her at the time of her last gave her diltiazem to use if needed. However, she hasn't needed this. She says she does well. He exercises routinely. She denies any palpitations, presyncope or syncope. She has had chest pressure, neck or arm discomfort. She has had some intentional weight loss. He's had no edema.  Of note her blood pressure was low recently and she was started on HCTZ. However, she had some hypotensive episodes and the dose was reduced. Just in the last 2 days her blood pressure is up but she had a lot of salt in a meal yesterday.   Allergies  Allergen Reactions  . Benicar [Olmesartan]     rash  . Penicillins   . Sulfonamide Derivatives   . Celebrex [Celecoxib] Anxiety  . Cymbalta [Duloxetine Hcl] Itching  . Neurontin [Gabapentin] Nausea Only  . Norvasc [Amlodipine Besylate] Swelling  . Prozac [Fluoxetine Hcl] Other (See Comments)  . Xanax [Alprazolam] Other (See Comments)  . Zoloft [Sertraline Hcl] Itching  . Zyprexa [Olanzapine] Other (See Comments)    Wt gain    Current Outpatient Prescriptions  Medication Sig Dispense Refill  . aspirin EC 81 MG tablet Take 81 mg by mouth daily.      . hydrochlorothiazide (MICROZIDE) 12.5 MG capsule Take 12.5 mg by mouth daily. Take 1/2 half daily       No current facility-administered medications for this visit.    Past Medical History  Diagnosis Date  . Fibromyalgia     Not currently under active treatment  . GERD (gastroesophageal reflux disease)   . Hiatal hernia   . Hypertension   . Dyslipidemia   . Obesity     Past Surgical History  Procedure Laterality Date  . Abdominal hysterectomy      ROS:  As stated in the HPI and negative for all other systems.  PHYSICAL EXAM BP 147/100  Pulse 90  Ht 5' 9.5"  (1.765 m)  Wt 209 lb (94.802 kg)  BMI 30.43 kg/m2 GENERAL:  Well appearing HEENT:  Pupils equal round and reactive, fundi not visualized, oral mucosa unremarkable NECK:  No jugular venous distention, waveform within normal limits, carotid upstroke brisk and symmetric, no bruits, no thyromegaly LYMPHATICS:  No cervical, inguinal adenopathy LUNGS:  Clear to auscultation bilaterally BACK:  No CVA tenderness CHEST:  Unremarkable HEART:  PMI not displaced or sustained,S1 and S2 within normal limits, no S3, no S4, no clicks, no rubs, no murmurs ABD:  Flat, positive bowel sounds normal in frequency in pitch, no bruits, no rebound, no guarding, no midline pulsatile mass, no hepatomegaly, no splenomegaly EXT:  2 plus pulses throughout, no edema, no cyanosis no clubbing SKIN:  No rashes no nodules NEURO:  Cranial nerves II through XII grossly intact, motor grossly intact throughout PSYCH:  Cognitively intact, oriented to person place and time    EKG:  Sinus rhythm, rate 74, axis within normal limits, intervals within normal limits, no acute ST-T wave changes.  08/26/2013   ASSESSMENT AND PLAN  ATRIAL FIB:  The patient would be a reasonable candidate for a nitroglycerin pill in pocket approach if she has this again the first dose administered in the emergency room. We discussed this strategy. She could at home take the by mouth Cardizem if not  expired prior to presentation. She is at low risk for thromboembolism. No anticoagulation is indicated.  HTN:  Her blood pressure is elevated today but it has been well recently and she had had her dose of hydrochlorothiazide reduced. I will defer to Dr Laurance Flatten.  She will watch her salt.  OVERWEIGHT:   I congratulated her on her weight loss. She will continue with more of the same.

## 2013-08-26 NOTE — Patient Instructions (Signed)
The current medical regimen is effective;  continue present plan and medications.  Follow up in 2 years with Dr Percival Spanish.  You will receive a letter in the mail 2 months before you are due.  Please call us when you receive this letter to schedule your follow up appointment.

## 2013-08-27 ENCOUNTER — Encounter: Payer: Self-pay | Admitting: Gastroenterology

## 2013-09-15 ENCOUNTER — Other Ambulatory Visit: Payer: Medicare Other | Admitting: Nurse Practitioner

## 2013-12-22 ENCOUNTER — Encounter: Payer: Medicare Other | Admitting: Nurse Practitioner

## 2013-12-23 NOTE — Progress Notes (Signed)
   Subjective:    Patient ID: Alison Garrett, female    DOB: 1945-06-01, 68 y.o.   MRN: [id number]  HPI    Review of Systems     Objective:   Physical Exam        Assessment & Plan:  Erroneous

## 2013-12-28 ENCOUNTER — Ambulatory Visit (INDEPENDENT_AMBULATORY_CARE_PROVIDER_SITE_OTHER): Payer: Medicare Other | Admitting: Family Medicine

## 2013-12-28 ENCOUNTER — Encounter: Payer: Self-pay | Admitting: Family Medicine

## 2013-12-28 VITALS — BP 126/98 | HR 92 | Temp 96.2°F | Ht 69.5 in | Wt 217.0 lb

## 2013-12-28 DIAGNOSIS — E785 Hyperlipidemia, unspecified: Secondary | ICD-10-CM

## 2013-12-28 DIAGNOSIS — R252 Cramp and spasm: Secondary | ICD-10-CM

## 2013-12-28 DIAGNOSIS — I10 Essential (primary) hypertension: Secondary | ICD-10-CM

## 2013-12-28 DIAGNOSIS — E559 Vitamin D deficiency, unspecified: Secondary | ICD-10-CM

## 2013-12-28 DIAGNOSIS — G629 Polyneuropathy, unspecified: Secondary | ICD-10-CM

## 2013-12-28 DIAGNOSIS — R251 Tremor, unspecified: Secondary | ICD-10-CM

## 2013-12-28 DIAGNOSIS — R531 Weakness: Secondary | ICD-10-CM

## 2013-12-28 DIAGNOSIS — Z13820 Encounter for screening for osteoporosis: Secondary | ICD-10-CM

## 2013-12-28 DIAGNOSIS — E8881 Metabolic syndrome: Secondary | ICD-10-CM

## 2013-12-28 LAB — POCT CBC
GRANULOCYTE PERCENT: 72.6 % (ref 37–80)
HEMATOCRIT: 45.9 % (ref 37.7–47.9)
Hemoglobin: 15 g/dL (ref 12.2–16.2)
LYMPH, POC: 1.7 (ref 0.6–3.4)
MCH, POC: 27.9 pg (ref 27–31.2)
MCHC: 32.6 g/dL (ref 31.8–35.4)
MCV: 85.6 fL (ref 80–97)
MPV: 7.8 fL (ref 0–99.8)
PLATELET COUNT, POC: 240 10*3/uL (ref 142–424)
POC GRANULOCYTE: 5.3 (ref 2–6.9)
POC LYMPH PERCENT: 22.8 %L (ref 10–50)
RBC: 5.4 M/uL (ref 4.04–5.48)
RDW, POC: 12.7 %
WBC: 7.3 10*3/uL (ref 4.6–10.2)

## 2013-12-28 NOTE — Patient Instructions (Addendum)
Medicare Annual Wellness Visit  [facility] and the medical providers at [facility] strive to bring you the best medical care.  In doing so we not only want to address your current medical conditions and concerns but also to detect new conditions early and prevent illness, disease and health-related problems.    Medicare offers a yearly Wellness Visit which allows our clinical staff to assess your need for preventative services including immunizations, lifestyle education, counseling to decrease risk of preventable diseases and screening for fall risk and other medical concerns.    This visit is provided free of charge (no copay) for all Medicare recipients. The clinical pharmacists at [facility] have begun to conduct these Wellness Visits which will also include a thorough review of all your medications.    As you primary medical provider recommend that you make an appointment for your Annual Wellness Visit if you have not done so already this year.  You may set up this appointment before you leave today or you may call back ([phone]) and schedule an appointment.  Please make sure when you call that you mention that you are scheduling your Annual Wellness Visit with the clinical pharmacist so that the appointment may be made for the proper length of time.     Continue current medications. Continue good therapeutic lifestyle changes which include good diet and exercise. Fall precautions discussed with patient. If an FOBT was given today- please return it to our front desk. If you are over 41 years old - you may need Prevnar 74 or the adult Pneumonia vaccine.  Flu Shots will be available at our office starting mid- September. Please call and schedule a FLU CLINIC APPOINTMENT.   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 Continue to check her blood  pressures at home We will call you with your lab work results was those results are available

## 2013-12-28 NOTE — Progress Notes (Signed)
Subjective:    Patient ID: Alison Garrett, female    DOB: 03-03-1945, 68 y.o.   MRN: [id number]  HPI Pt here for follow up and management of chronic medical problems. Patient complains today of jittery feeling at times and weakness.the patient is due a flu shot and Prevnar vaccine but she refuses these. A DEXA scan will be ordered and she will get lab work. She also requests a refill on her hydrochlorothiazide. The patient indicates that she just does not feel well. She has no energy. She feels weak. She feels her heart pounding. She says it is not atrial fibrillation. She checks her blood pressures at home and they run in the 1:30 to 140 range over 80-90 range. She complains of both forearms from the elbow down going to repeat blood pressure in the office was 126/98 in the right arm sitting.        Patient Active Problem List   Diagnosis Date Noted  . Metabolic syndrome 45/80/9983  . Hyperlipemia 09/17/2012  . Vitamin D deficiency 09/17/2012  . Fibromyalgia 09/17/2012  . Atrial fibrillation 07/11/2011  . OBESITY, UNSPECIFIED 12/01/2008  . HYPERTENSION, BENIGN 12/01/2008  . ABNORMAL ELECTROCARDIOGRAM 12/01/2008   Outpatient Encounter Prescriptions as of 12/28/2013  Medication Sig  . aspirin EC 81 MG tablet Take 81 mg by mouth daily.  . hydrochlorothiazide (MICROZIDE) 12.5 MG capsule Take 12.5 mg by mouth daily. Take 1/2 half daily    Review of Systems  HENT: Negative.   Eyes: Negative.   Respiratory: Negative.   Cardiovascular: Negative.   Gastrointestinal: Negative.   Endocrine: Negative.   Genitourinary: Negative.   Musculoskeletal: Negative.   Skin: Negative.   Allergic/Immunologic: Negative.   Neurological: Positive for tremors ("jittery feeling") and weakness.  Hematological: Negative.   Psychiatric/Behavioral: Negative.        Objective:   Physical Exam  Constitutional: She is oriented to person, place, and time. She appears well-developed and well-nourished.  No distress.  HENT:  Head: Normocephalic and atraumatic.  Right Ear: External ear normal.  Left Ear: External ear normal.  Nose: Nose normal.  Mouth/Throat: Oropharynx is clear and moist. No oropharyngeal exudate.  Eyes: Conjunctivae and EOM are normal. Pupils are equal, round, and reactive to light. Right eye exhibits no discharge. Left eye exhibits no discharge. No scleral icterus.  Neck: Normal range of motion. Neck supple. No thyromegaly present.  No anterior cervical adenopathy and no carotid bruits  Cardiovascular: Normal rate, regular rhythm, normal heart sounds and intact distal pulses.  Exam reveals no gallop and no friction rub.   No murmur heard. The heart has a regular rate and rhythm at 84/m  Pulmonary/Chest: Effort normal and breath sounds normal. No respiratory distress. She has no wheezes. She has no rales. She exhibits no tenderness.  Abdominal: Soft. Bowel sounds are normal. She exhibits no mass. There is tenderness. There is no rebound and no guarding.  There is no inguinal adenopathy.There is slight epigastric tenderness  Musculoskeletal: Normal range of motion. She exhibits no edema or tenderness.  Lymphadenopathy:    She has no cervical adenopathy.  Neurological: She is alert and oriented to person, place, and time. She has normal reflexes. No cranial nerve deficit.  Her speech was good.A slight tremor was noted today in the left hand.  Skin: Skin is warm and dry.  Skin is dry in general.  Psychiatric: She has a normal mood and affect. Her behavior is normal. Judgment and thought content normal.  Nursing note  and vitals reviewed.  BP 135/92 mmHg  Pulse 92  Temp(Src) 96.2 F (35.7 C) (Oral)  Ht 5' 9.5" (1.765 m)  Wt 217 lb (98.431 kg)  BMI 31.60 kg/m2        Assessment & Plan:  1. HYPERTENSION, BENIGN - POCT CBC - BMP8+EGFR - Hepatic function panel  2. Vitamin D deficiency - POCT CBC - Vit D  25 hydroxy (rtn osteoporosis monitoring) - DG Bone  Density; Future  3. Hyperlipemia - POCT CBC - NMR, lipoprofile  4. Metabolic syndrome - POCT CBC  5. Weakness - POCT CBC - BMP8+EGFR - Hepatic function panel - NMR, lipoprofile - Thyroid Panel With TSH - Vit D  25 hydroxy (rtn osteoporosis monitoring) - Ambulatory referral to Neurology  6. Screening for osteoporosis - DG Bone Density; Future  7. Tremor of both hands - Ambulatory referral to Neurology  8. Neuropathy - Ambulatory referral to Neurology  9. Cramp of both lower extremities - Ambulatory referral to Neurology  No orders of the defined types were placed in this encounter.   Patient Instructions                       Medicare Annual Wellness Visit  [facility] and the medical providers at [facility] strive to bring you the best medical care.  In doing so we not only want to address your current medical conditions and concerns but also to detect new conditions early and prevent illness, disease and health-related problems.    Medicare offers a yearly Wellness Visit which allows our clinical staff to assess your need for preventative services including immunizations, lifestyle education, counseling to decrease risk of preventable diseases and screening for fall risk and other medical concerns.    This visit is provided free of charge (no copay) for all Medicare recipients. The clinical pharmacists at [facility] have begun to conduct these Wellness Visits which will also include a thorough review of all your medications.    As you primary medical provider recommend that you make an appointment for your Annual Wellness Visit if you have not done so already this year.  You may set up this appointment before you leave today or you may call back ([phone]) and schedule an appointment.  Please make sure when you call that you mention that you are scheduling your Annual Wellness Visit with the clinical pharmacist so that the  appointment may be made for the proper length of time.     Continue current medications. Continue good therapeutic lifestyle changes which include good diet and exercise. Fall precautions discussed with patient. If an FOBT was given today- please return it to our front desk. If you are over 35 years old - you may need Prevnar 78 or the adult Pneumonia vaccine.  Flu Shots will be available at our office starting mid- September. Please call and schedule a FLU CLINIC APPOINTMENT.   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 Continue to check her blood pressures at home We will call you with your lab work results was those results are available   Arrie Senate MD

## 2013-12-29 LAB — NMR, LIPOPROFILE
Cholesterol: 191 mg/dL (ref 100–199)
HDL CHOLESTEROL BY NMR: 44 mg/dL (ref >39)
HDL PARTICLE NUMBER: 31.1 umol/L (ref >=30.5)
LDL Particle Number: 1546 nmol/L — ABNORMAL HIGH (ref <1000)
LDL Size: 20.7 nm (ref >20.5)
LDL-C: 120 mg/dL — AB (ref 0–99)
LP-IR Score: 74 — ABNORMAL HIGH (ref <=45)
Small LDL Particle Number: 689 nmol/L — ABNORMAL HIGH (ref <=527)
Triglycerides by NMR: 136 mg/dL (ref 0–149)

## 2013-12-29 LAB — BMP8+EGFR
BUN/Creatinine Ratio: 14 (ref 11–26)
BUN: 16 mg/dL (ref 8–27)
CALCIUM: 9.7 mg/dL (ref 8.7–10.3)
CHLORIDE: 97 mmol/L (ref 97–108)
CO2: 26 mmol/L (ref 18–29)
CREATININE: 1.12 mg/dL — AB (ref 0.57–1.00)
GFR calc Af Amer: 59 mL/min/{1.73_m2} — ABNORMAL LOW (ref >59)
GFR calc non Af Amer: 51 mL/min/{1.73_m2} — ABNORMAL LOW (ref >59)
GLUCOSE: 113 mg/dL — AB (ref 65–99)
Potassium: 4.4 mmol/L (ref 3.5–5.2)
Sodium: 138 mmol/L (ref 134–144)

## 2013-12-29 LAB — THYROID PANEL WITH TSH
FREE THYROXINE INDEX: 2.6 (ref 1.2–4.9)
T3 Uptake Ratio: 25 % (ref 24–39)
T4, Total: 10.5 ug/dL (ref 4.5–12.0)
TSH: 1.57 u[IU]/mL (ref 0.450–4.500)

## 2013-12-29 LAB — HEPATIC FUNCTION PANEL
ALBUMIN: 4.1 g/dL (ref 3.6–4.8)
ALT: 39 IU/L — ABNORMAL HIGH (ref 0–32)
AST: 29 IU/L (ref 0–40)
Alkaline Phosphatase: 114 IU/L (ref 39–117)
Bilirubin, Direct: 0.16 mg/dL (ref 0.00–0.40)
Total Bilirubin: 0.7 mg/dL (ref 0.0–1.2)
Total Protein: 7.4 g/dL (ref 6.0–8.5)

## 2013-12-29 LAB — VITAMIN D 25 HYDROXY (VIT D DEFICIENCY, FRACTURES): VIT D 25 HYDROXY: 19.1 ng/mL — AB (ref 30.0–100.0)

## 2013-12-31 ENCOUNTER — Other Ambulatory Visit: Payer: Self-pay | Admitting: *Deleted

## 2013-12-31 MED ORDER — VITAMIN D (ERGOCALCIFEROL) 1.25 MG (50000 UNIT) PO CAPS: ORAL_CAPSULE | ORAL | Status: DC

## 2014-01-12 ENCOUNTER — Encounter: Payer: Self-pay | Admitting: Neurology

## 2014-01-12 ENCOUNTER — Ambulatory Visit (INDEPENDENT_AMBULATORY_CARE_PROVIDER_SITE_OTHER): Payer: Medicare Other | Admitting: Neurology

## 2014-01-12 VITALS — BP 150/91 | HR 73 | Ht 70.0 in | Wt 222.8 lb

## 2014-01-12 DIAGNOSIS — R5382 Chronic fatigue, unspecified: Secondary | ICD-10-CM

## 2014-01-12 DIAGNOSIS — R200 Anesthesia of skin: Secondary | ICD-10-CM

## 2014-01-12 DIAGNOSIS — R5383 Other fatigue: Secondary | ICD-10-CM | POA: Insufficient documentation

## 2014-01-12 DIAGNOSIS — E538 Deficiency of other specified B group vitamins: Secondary | ICD-10-CM

## 2014-01-12 DIAGNOSIS — G250 Essential tremor: Secondary | ICD-10-CM

## 2014-01-12 DIAGNOSIS — M797 Fibromyalgia: Secondary | ICD-10-CM

## 2014-01-12 HISTORY — DX: Essential tremor: G25.0

## 2014-01-12 NOTE — Patient Instructions (Signed)
Fibromyalgia Fibromyalgia is a disorder that is often misunderstood. It is associated with muscular pains and tenderness that comes and goes. It is often associated with fatigue and sleep disturbances. Though it tends to be long-lasting, fibromyalgia is not life-threatening. CAUSES  The exact cause of fibromyalgia is unknown. People with certain gene types are predisposed to developing fibromyalgia and other conditions. Certain factors can play a role as triggers, such as:  Spine disorders.  Arthritis.  Severe injury (trauma) and other physical stressors.  Emotional stressors. SYMPTOMS   The main symptom is pain and stiffness in the muscles and joints, which can vary over time.  Sleep and fatigue problems. Other related symptoms may include:  Bowel and bladder problems.  Headaches.  Visual problems.  Problems with odors and noises.  Depression or mood changes.  Painful periods (dysmenorrhea).  Dryness of the skin or eyes. DIAGNOSIS  There are no specific tests for diagnosing fibromyalgia. Patients can be diagnosed accurately from the specific symptoms they have. The diagnosis is made by determining that nothing else is causing the problems. TREATMENT  There is no cure. Management includes medicines and an active, healthy lifestyle. The goal is to enhance physical fitness, decrease pain, and improve sleep. HOME CARE INSTRUCTIONS   Only take over-the-counter or prescription medicines as directed by your caregiver. Sleeping pills, tranquilizers, and pain medicines may make your problems worse.  Low-impact aerobic exercise is very important and advised for treatment. At first, it may seem to make pain worse. Gradually increasing your tolerance will overcome this feeling.  Learning relaxation techniques and how to control stress will help you. Biofeedback, visual imagery, hypnosis, muscle relaxation, yoga, and meditation are all options.  Anti-inflammatory medicines and  physical therapy may provide short-term help.  Acupuncture or massage treatments may help.  Take muscle relaxant medicines as suggested by your caregiver.  Avoid stressful situations.  Plan a healthy lifestyle. This includes your diet, sleep, rest, exercise, and friends.  Find and practice a hobby you enjoy.  Join a fibromyalgia support group for interaction, ideas, and sharing advice. This may be helpful. SEEK MEDICAL CARE IF:  You are not having good results or improvement from your treatment. FOR MORE INFORMATION  National Fibromyalgia Association: www.fmaware.org Arthritis Foundation: www.arthritis.org Document Released: 01/29/2005 Document Revised: 04/23/2011 Document Reviewed: 05/11/2009 ExitCare Patient Information 2015 [employer]. This information is not intended to replace advice given to you by your health care provider. Make sure you discuss any questions you have with your health care provider.  

## 2014-01-12 NOTE — Progress Notes (Signed)
Reason for visit: Fatigue  Alison Garrett is a 68 y.o. female  History of present illness:  Alison Garrett is a 68 year old right-handed white female with a history of fibromyalgia. The patient indicates that within the last 2 months, she has had a significant increase in underlying fatigue level. The patient has no true weakness of the extremities, but she does report some occasional cramps in the legs. The patient has had some issues with numbness in the hands and feet over the last 8 months or so. The patient has numbness in the arms from the forearm into the hand that is worse in the morning. More recently, she has noted some numbness and squeezing sensations in the feet and ankles, some burning in the feet at night. She denies any balance issues or problems controlling the bowels or the bladder. She usually will sleep fairly well at night. She was noted to have a significantly low vitamin D level, and she is on supplementation. She indicates some benefit with the fatigue just with 2 doses of the vitamin D tablets. She is sent to this office for an evaluation.  Past Medical History  Diagnosis Date  . Fibromyalgia     Not currently under active treatment  . GERD (gastroesophageal reflux disease)   . Hiatal hernia   . Hypertension   . Dyslipidemia   . Obesity   . Essential tremor 01/12/2014    Past Surgical History  Procedure Laterality Date  . Abdominal hysterectomy      Family History  Problem Relation Age of Onset  . Coronary artery disease Mother   . Heart disease Mother     CABG  . Cancer Mother   . COPD Father   . Hepatitis Sister     autoimune  . Diabetes Sister     Social history:  reports that she has never smoked. She has never used smokeless tobacco. She reports that she does not drink alcohol or use illicit drugs.  Medications:  Current Outpatient Prescriptions on File Prior to Visit  Medication Sig Dispense Refill  . aspirin EC 81 MG tablet Take 81 mg by mouth  daily.    . Vitamin D, Ergocalciferol, (DRISDOL) 50000 UNITS CAPS capsule Take 1 PO once weekly for 12 weeks 12 capsule 0   No current facility-administered medications on file prior to visit.      Allergies  Allergen Reactions  . Benicar [Olmesartan]     rash  . Penicillins   . Sulfonamide Derivatives   . Celebrex [Celecoxib] Anxiety  . Cymbalta [Duloxetine Hcl] Itching  . Neurontin [Gabapentin] Nausea Only  . Norvasc [Amlodipine Besylate] Swelling  . Prozac [Fluoxetine Hcl] Other (See Comments)  . Xanax [Alprazolam] Other (See Comments)  . Zoloft [Sertraline Hcl] Itching  . Zyprexa [Olanzapine] Other (See Comments)    Wt gain    ROS:  Out of a complete 14 system review of symptoms, the patient complains only of the following symptoms, and all other reviewed systems are negative.  Fatigue Joint pain, muscle cramps Numbness  Blood pressure 150/91, pulse 73, height 5\' 10"  (1.778 m), weight 222 lb 12.8 oz (101.061 kg).  Physical Exam  General: The patient is alert and cooperative at the time of the examination. The patient is minimally to moderately obese.  Eyes: Pupils are equal, round, and reactive to light. Discs are flat bilaterally.  Neck: The neck is supple, no carotid bruits are noted.  Respiratory: The respiratory examination is clear.  Cardiovascular: The  cardiovascular examination reveals a regular rate and rhythm, no obvious murmurs or rubs are noted.  Skin: Extremities are without significant edema.  Neurologic Exam  Mental status: The patient is alert and oriented x 3 at the time of the examination. The patient has apparent normal recent and remote memory, with an apparently normal attention span and concentration ability.  Cranial nerves: Facial symmetry is present. There is good sensation of the face to pinprick and soft touch bilaterally. The strength of the facial muscles and the muscles to head turning and shoulder shrug are normal bilaterally.  Speech is well enunciated, no aphasia or dysarthria is noted. Extraocular movements are full. Visual fields are full. The tongue is midline, and the patient has symmetric elevation of the soft palate. No obvious hearing deficits are noted. A side-to-side head tremor is noted.  Motor: The motor testing reveals 5 over 5 strength of all 4 extremities. Good symmetric motor tone is noted throughout.  Sensory: Sensory testing is intact to pinprick, soft touch, vibration sensation, and position sense on all 4 extremities. No evidence of extinction is noted.  Coordination: Cerebellar testing reveals good finger-nose-finger and heel-to-shin bilaterally. Tinel sign at the wrists are negative.  Gait and station: Gait is normal. Tandem gait is normal. Romberg is negative. No drift is seen.  Reflexes: Deep tendon reflexes are symmetric and normal bilaterally. Toes are downgoing bilaterally.   Assessment/Plan:  1. Chronic fatigue  2. History fibromyalgia  3. Essential tremor, head and neck  4. Numbness and paresthesias of all 4 extremities, possible peripheral neuropathy  5. Vitamin D deficiency  The patient will be sent for further blood work today, and she will be set up for nerve conduction studies of both arms and one leg. EMG will be done on at least one arm. The patient will follow-up for the above study. The patient seems to be gaining benefit with the vitamin D supplementation in regards to her fatigue.  Jill Alexanders MD 01/12/2014 4:26 PM  [facility] [address] [place], [place] [address]  Phone [phone] Fax [phone]

## 2014-01-13 ENCOUNTER — Telehealth: Payer: Self-pay | Admitting: Neurology

## 2014-01-13 LAB — ENA+DNA/DS+SJORGEN'S
DSDNA AB: 54 [IU]/mL — AB (ref 0–9)
ENA RNP Ab: <0.2 AI (ref 0.0–0.9)
ENA SM Ab Ser-aCnc: <0.2 AI (ref 0.0–0.9)
ENA SSB (LA) Ab: 6.3 AI — ABNORMAL HIGH (ref 0.0–0.9)

## 2014-01-13 LAB — IFE AND PE, SERUM
ALBUMIN SERPL ELPH-MCNC: 3.6 g/dL (ref 3.2–5.6)
Albumin/Glob SerPl: 1.2 (ref 0.7–2.0)
Alpha 1: 0.2 g/dL (ref 0.1–0.4)
Alpha2 Glob SerPl Elph-Mcnc: 0.7 g/dL (ref 0.4–1.2)
B-GLOBULIN SERPL ELPH-MCNC: 1 g/dL (ref 0.6–1.3)
Gamma Glob SerPl Elph-Mcnc: 1.4 g/dL (ref 0.5–1.6)
Globulin, Total: 3.2 g/dL (ref 2.0–4.5)
IGG (IMMUNOGLOBIN G), SERUM: 1398 mg/dL (ref 700–1600)
IGM (IMMUNOGLOBULIN M), SRM: 87 mg/dL (ref 40–230)
IgA/Immunoglobulin A, Serum: 194 mg/dL (ref 91–414)
Total Protein: 6.8 g/dL (ref 6.0–8.5)

## 2014-01-13 LAB — RHEUMATOID FACTOR: Rhuematoid fact SerPl-aCnc: 34 IU/mL — ABNORMAL HIGH (ref 0.0–13.9)

## 2014-01-13 LAB — SEDIMENTATION RATE: Sed Rate: 5 mm/hr (ref 0–40)

## 2014-01-13 LAB — LYME, TOTAL AB TEST/REFLEX

## 2014-01-13 LAB — VITAMIN B12: Vitamin B-12: 1119 pg/mL — ABNORMAL HIGH (ref 211–946)

## 2014-01-13 LAB — ANA W/REFLEX: Anti Nuclear Antibody(ANA): POSITIVE — AB

## 2014-01-13 LAB — ANGIOTENSIN CONVERTING ENZYME: Angio Convert Enzyme: 51 U/L (ref 14–82)

## 2014-01-13 NOTE — Telephone Encounter (Signed)
I called the patient. The patient has a positive rheumatoid factor and positive ANA. SSA and SSB antibodies are elevated. The patient may have an underlying connective tissue disorder as a source of her fibromyalgia type syndrome and her fatigue. If she is amenable to it, I will refer her to a rheumatologist for second opinion.

## 2014-01-20 ENCOUNTER — Ambulatory Visit (INDEPENDENT_AMBULATORY_CARE_PROVIDER_SITE_OTHER): Payer: Medicare Other | Admitting: Neurology

## 2014-01-20 ENCOUNTER — Ambulatory Visit (INDEPENDENT_AMBULATORY_CARE_PROVIDER_SITE_OTHER): Payer: Self-pay | Admitting: Neurology

## 2014-01-20 DIAGNOSIS — M797 Fibromyalgia: Secondary | ICD-10-CM

## 2014-01-20 DIAGNOSIS — G250 Essential tremor: Secondary | ICD-10-CM

## 2014-01-20 DIAGNOSIS — R200 Anesthesia of skin: Secondary | ICD-10-CM

## 2014-01-20 DIAGNOSIS — R5382 Chronic fatigue, unspecified: Secondary | ICD-10-CM

## 2014-01-20 NOTE — Progress Notes (Signed)
Alison Garrett is a 68 year old patient with a history of fibromyalgia who recently reports some numbness and sensory alterations on all 4 extremities. She comes in today for an evaluation of a possible peripheral neuropathy.  Nerve conduction studies on the upper extremities and the right lower extremity shows evidence of a peripheral neuropathy, there is evidence of mild bilateral ulnar sensory latency prolongation, early ulnar neuropathies may need to be considered. EMG evaluation of the left arm was unremarkable, without evidence of an overlying cervical radiculopathy.  The patient's had blood work that shows a positive rheumatoid factor, and positive SSA and SSB antibodies. The significance of this is not clear, the patient will be referred to rheumatology physician for an opinion regarding this issue. The patient will follow-up through this office if needed. If the patient is not felt to have a connective tissue disorder, MRI of the cervical spine may be done in the future.

## 2014-01-20 NOTE — Procedures (Signed)
     HISTORY:  Alison Garrett is a 68 year old patient with a history of fibromyalgia who has recent reported some issues with numbness and tingly sensations on the left greater than right upper extremity and both lower extremities. She is being evaluated for possible peripheral neuropathy.  NERVE CONDUCTION STUDIES:  Nerve conduction studies were performed on both upper extremities. The distal motor latencies and motor amplitudes for the median and ulnar nerves were within normal limits. The F wave latencies and nerve conduction velocities for these nerves were also normal. The sensory latencies for the median nerves were normal, but the ulnar sensory latencies were minimally prolonged bilaterally.   Nerve conduction studies were performed on the right lower extremity. The distal motor latencies and motor amplitudes for the peroneal and posterior tibial nerves were within normal limits. The nerve conduction velocities for these nerves were also normal. The F wave latencies for the peroneal and posterior tibial nerves were normal. The sensory latency for the peroneal nerve was within normal limits.   EMG STUDIES:  EMG study was performed on the left upper extremity:  The first dorsal interosseous muscle reveals 2 to 4 K units with full recruitment. No fibrillations or positive waves were noted. The abductor pollicis brevis muscle reveals 2 to 4 K units with full recruitment. No fibrillations or positive waves were noted. The extensor indicis proprius muscle reveals 1 to 3 K units with full recruitment. No fibrillations or positive waves were noted. The pronator teres muscle reveals 2 to 3 K units with full recruitment. No fibrillations or positive waves were noted. The biceps muscle reveals 1 to 2 K units with full recruitment. No fibrillations or positive waves were noted. The triceps muscle reveals 2 to 4 K units with full recruitment. No fibrillations or positive waves were noted. The  anterior deltoid muscle reveals 2 to 3 K units with full recruitment. No fibrillations or positive waves were noted. The cervical paraspinal muscles were tested at 2 levels. No abnormalities of insertional activity were seen at either level tested. There was poor relaxation.   IMPRESSION:  Nerve conduction studies done on both upper extremities and the right lower extremity were relatively unremarkable, without clear evidence of a peripheral neuropathy. Nerve conduction studies did show minimal prolongation of the ulnar sensory latencies bilaterally, a very early ulnar neuropathy should be considered on both sides. EMG evaluation of the left upper extremity was unremarkable, without evidence of an overlying cervical radiculopathy.  Jill Alexanders MD 01/20/2014 10:56 AM  [facility] [address] [place], [place] [address]  Phone [phone] Fax [phone]

## 2014-01-20 NOTE — Progress Notes (Signed)
Please refer to EMG note. 

## 2014-01-27 ENCOUNTER — Other Ambulatory Visit: Payer: Self-pay | Admitting: Family Medicine

## 2014-01-28 MED ORDER — HYDROCHLOROTHIAZIDE 25 MG PO TABS: 25.0000 mg | ORAL_TABLET | Freq: Every day | ORAL | Status: DC

## 2014-03-15 ENCOUNTER — Other Ambulatory Visit: Payer: Medicare Other | Admitting: Nurse Practitioner

## 2014-03-17 ENCOUNTER — Ambulatory Visit: Payer: Medicare Other

## 2014-03-17 ENCOUNTER — Other Ambulatory Visit: Payer: Medicare Other

## 2014-03-24 ENCOUNTER — Ambulatory Visit (INDEPENDENT_AMBULATORY_CARE_PROVIDER_SITE_OTHER): Payer: Commercial Managed Care - HMO | Admitting: Pharmacist

## 2014-03-24 ENCOUNTER — Encounter: Payer: Self-pay | Admitting: Pharmacist

## 2014-03-24 ENCOUNTER — Telehealth: Payer: Self-pay | Admitting: Neurology

## 2014-03-24 ENCOUNTER — Other Ambulatory Visit: Payer: Self-pay | Admitting: Family Medicine

## 2014-03-24 ENCOUNTER — Ambulatory Visit (INDEPENDENT_AMBULATORY_CARE_PROVIDER_SITE_OTHER): Payer: Commercial Managed Care - HMO

## 2014-03-24 VITALS — BP 140/90 | HR 77 | Ht 69.0 in | Wt 222.0 lb

## 2014-03-24 DIAGNOSIS — Z13820 Encounter for screening for osteoporosis: Secondary | ICD-10-CM

## 2014-03-24 DIAGNOSIS — E559 Vitamin D deficiency, unspecified: Secondary | ICD-10-CM

## 2014-03-24 DIAGNOSIS — R7989 Other specified abnormal findings of blood chemistry: Secondary | ICD-10-CM | POA: Diagnosis not present

## 2014-03-24 NOTE — Telephone Encounter (Signed)
Dr. Jannifer Franklin do you have another recommendation for the patient since Dr. Trudie Reed practice will not accept the referring diagnosis of fibromyalgia?

## 2014-03-24 NOTE — Telephone Encounter (Signed)
Alison Garrett called back and states she did not receive the referral but that none of their providers are taking fibromyalgia patients. Please advise.

## 2014-03-24 NOTE — Progress Notes (Signed)
Patient ID: Alison Garrett, female   DOB: 29-Apr-1945, 69 y.o.   MRN: [id number]  [facility] Current Height: Height: 5\' 9"  (175.3 cm)      Max Lifetime Height:  5\' 10"  Current Weight: Weight: 222 lb (100.699 kg)       Ethnicity:Caucasian  BP: BP: 140/90 mmHg     HR:  Pulse Rate: 77      HPI: Does pt already have a diagnosis of:  Osteopenia?  No Osteoporosis?  No  Back Pain?  No       Kyphosis?  No Prior fracture?  No Med(s) for Osteoporosis/Osteopenia:  none Med(s) previously tried for Osteoporosis/Osteopenia:  none                                                             PMH: Age at menopause:  Surgical at 69yo Hysterectomy?  Yes Oophorectomy?  No HRT? No Steroid Use?  No Thyroid med?  No History of cancer?  No History of digestive disorders (ie Crohn's)?  No Current or previous eating disorders?  No Last Vitamin D Result:  19.1 (12/28/2013) Last GFR Result:  51 (12/28/2013)   FH/SH: Family history of osteoporosis?  No Parent with history of hip fracture?  No Family history of breast cancer?  No Exercise?  Yes - 5 days per week - walking Smoking?  No Alcohol?  No    Calcium Assessment Calcium Intake  # of servings/day  Calcium mg  Milk (8 oz) 1  x  300  = 300mg   Yogurt (4 oz) 0 x  200 = 0  Cheese (1 oz) 1 x  200 = 200mg   Other Calcium sources   250mg   Ca supplement 0 = 0   Estimated calcium intake per day 750mg     DEXA Results Date of Test T-Score for AP Spine L1-L4 T-Score for Total Left Hip T-Score for Total Right Hip  03/24/2014 1.8 0.6 0.8  10/03/2011 1.4 0.5 0.7              Assessment: Normal BMD Low Vitamin D - due recheck, not currently on therapy.  Recommendations: 1.  Discussed BMD results and fracture risk 2.  recommend calcium 1200mg  daily through supplementation or diet.  3.  continue weight bearing exercise - 30 minutes at least 4 days per week.   4.  Counseled and educated about fall risk and prevention. 5.  Checking vitamin  D level today.  6.  Also patient was referred to rheumatologist by Dr Jannifer Franklin.  She has not heard from either Dr Jannifer Franklin' office or Dr Trudie Reed' off to whom she was referred.  I will get our referral dept to check into.    Recheck DEXA:  5 years  Time spent counseling patient:  30 minutes   Cherre Robins, PharmD, CPP

## 2014-03-24 NOTE — Telephone Encounter (Signed)
I called and left a message on Dr. Trudie Reed new pt coordinators line to find out the status of this patients referral.

## 2014-03-24 NOTE — Telephone Encounter (Signed)
The purpose of the referral was not for fibromyalgia, the patient has had abnormal blood work showing a positive rheumatoid factor, and SSA and SSB antibodies. I would like to know if the patient has a connective tissue disease process. Please refer the patient to Dr. Trudie Reed.

## 2014-03-25 LAB — VITAMIN D 25 HYDROXY (VIT D DEFICIENCY, FRACTURES): VIT D 25 HYDROXY: 23.9 ng/mL — AB (ref 30.0–100.0)

## 2014-03-26 ENCOUNTER — Telehealth: Payer: Self-pay | Admitting: Pharmacist

## 2014-03-26 MED ORDER — VITAMIN D (ERGOCALCIFEROL) 1.25 MG (50000 UNIT) PO CAPS: 50000.0000 [IU] | ORAL_CAPSULE | ORAL | Status: DC

## 2014-03-26 NOTE — Telephone Encounter (Signed)
I tried to call Dr. Trudie Reed, their office is closed currently. I will try back next week.

## 2014-03-26 NOTE — Telephone Encounter (Signed)
Vitamin D has improved - recommend 12 more weeks of vitamin D 50,000IU 1 weekly. Recheck in 12 weeks.  Tried to call patient - no ans - LM on VM to call office

## 2014-03-26 NOTE — Telephone Encounter (Signed)
Erline Levine called back and states that Dr. Trudie Reed has seen the notes and still declined the patient. They recommended that if you want the patient to be seen by her you would need to call the office [phone] and speak directly with Dr. Trudie Reed

## 2014-03-30 NOTE — Telephone Encounter (Signed)
I called Dr. Trudie Reed, left a message for her to contact me concerning this patient. I talked with her nurse.

## 2014-03-31 NOTE — Telephone Encounter (Signed)
Received a callback that Dr. Trudie Reed has decided to accept patient and to resend information for referral. I will resend information

## 2014-04-01 NOTE — Telephone Encounter (Signed)
I called Dr. Trudie Reed again, apparently she has approved the referral, the patient will be seen soon.

## 2014-04-27 DIAGNOSIS — M2550 Pain in unspecified joint: Secondary | ICD-10-CM | POA: Diagnosis not present

## 2014-04-27 DIAGNOSIS — M797 Fibromyalgia: Secondary | ICD-10-CM | POA: Diagnosis not present

## 2014-04-27 DIAGNOSIS — R5382 Chronic fatigue, unspecified: Secondary | ICD-10-CM | POA: Diagnosis not present

## 2014-04-27 DIAGNOSIS — R768 Other specified abnormal immunological findings in serum: Secondary | ICD-10-CM | POA: Diagnosis not present

## 2014-05-26 DIAGNOSIS — M329 Systemic lupus erythematosus, unspecified: Secondary | ICD-10-CM | POA: Diagnosis not present

## 2014-05-26 DIAGNOSIS — R768 Other specified abnormal immunological findings in serum: Secondary | ICD-10-CM | POA: Diagnosis not present

## 2014-05-26 DIAGNOSIS — M797 Fibromyalgia: Secondary | ICD-10-CM | POA: Diagnosis not present

## 2014-05-26 DIAGNOSIS — R5382 Chronic fatigue, unspecified: Secondary | ICD-10-CM | POA: Diagnosis not present

## 2014-05-26 DIAGNOSIS — M2550 Pain in unspecified joint: Secondary | ICD-10-CM | POA: Diagnosis not present

## 2014-06-23 ENCOUNTER — Ambulatory Visit: Payer: Medicare Other | Admitting: Family Medicine

## 2014-06-29 ENCOUNTER — Telehealth: Payer: Self-pay | Admitting: Family Medicine

## 2014-07-01 ENCOUNTER — Other Ambulatory Visit: Payer: Self-pay | Admitting: *Deleted

## 2014-07-01 DIAGNOSIS — L989 Disorder of the skin and subcutaneous tissue, unspecified: Secondary | ICD-10-CM

## 2014-08-11 ENCOUNTER — Ambulatory Visit (INDEPENDENT_AMBULATORY_CARE_PROVIDER_SITE_OTHER): Payer: Commercial Managed Care - HMO

## 2014-08-11 ENCOUNTER — Encounter: Payer: Self-pay | Admitting: Family Medicine

## 2014-08-11 ENCOUNTER — Ambulatory Visit (INDEPENDENT_AMBULATORY_CARE_PROVIDER_SITE_OTHER): Payer: Commercial Managed Care - HMO | Admitting: Family Medicine

## 2014-08-11 VITALS — BP 133/85 | HR 80 | Temp 97.3°F | Ht 69.0 in | Wt 221.0 lb

## 2014-08-11 DIAGNOSIS — E785 Hyperlipidemia, unspecified: Secondary | ICD-10-CM | POA: Diagnosis not present

## 2014-08-11 DIAGNOSIS — E8881 Metabolic syndrome: Secondary | ICD-10-CM

## 2014-08-11 DIAGNOSIS — Z889 Allergy status to unspecified drugs, medicaments and biological substances status: Secondary | ICD-10-CM | POA: Diagnosis not present

## 2014-08-11 DIAGNOSIS — I10 Essential (primary) hypertension: Secondary | ICD-10-CM | POA: Diagnosis not present

## 2014-08-11 DIAGNOSIS — E559 Vitamin D deficiency, unspecified: Secondary | ICD-10-CM

## 2014-08-11 DIAGNOSIS — M797 Fibromyalgia: Secondary | ICD-10-CM | POA: Diagnosis not present

## 2014-08-11 NOTE — Progress Notes (Addendum)
Subjective:    Patient ID: Alison Garrett, female    DOB: [birth year], 69 y.o.   MRN: [id number]  HPI Pt here for follow up and management of chronic medical problems which includes hypertension and hyperlipidemia. She is taking medications regularly. The patient recently saw the neurologist because of tremors muscle aches and myalgias and fibromyalgia. He got nerve conduction studies and these were normal. He indicated in his note that the patient thought vitamin D supplementation have helped her some. The patient is due to have a colonoscopy her last one was done in February 2005 and she says that she will schedule that. She is due to get a chest x-ray, to return an FOBT card and get lab work. The patient denies any complaints or problems with her chest lungs stoma or urinary tract. Her biggest issues are continuing to deal with her fibromyalgia.      Patient Active Problem List   Diagnosis Date Noted  . Fatigue 01/12/2014  . Essential tremor 01/12/2014  . Metabolic syndrome 96/75/9163  . Hyperlipemia 09/17/2012  . Vitamin D deficiency 09/17/2012  . Fibromyalgia 09/17/2012  . Atrial fibrillation 07/11/2011  . OBESITY, UNSPECIFIED 12/01/2008  . HYPERTENSION, BENIGN 12/01/2008  . ABNORMAL ELECTROCARDIOGRAM 12/01/2008   Outpatient Encounter Prescriptions as of 08/11/2014  Medication Sig  . aspirin EC 81 MG tablet Take 81 mg by mouth daily.  . hydrochlorothiazide (HYDRODIURIL) 25 MG tablet Take 1 tablet (25 mg total) by mouth daily.  . [DISCONTINUED] Vitamin D, Ergocalciferol, (DRISDOL) 50000 UNITS CAPS capsule Take 1 capsule (50,000 Units total) by mouth once a week.   No facility-administered encounter medications on file as of 08/11/2014.     Review of Systems  Constitutional: Negative.   HENT: Negative.   Eyes: Negative.   Respiratory: Negative.   Cardiovascular: Negative.   Gastrointestinal: Negative.   Endocrine: Negative.   Genitourinary: Negative.   Musculoskeletal:  Negative.   Skin: Negative.   Allergic/Immunologic: Negative.   Neurological: Negative.   Hematological: Negative.   Psychiatric/Behavioral: Negative.        Objective:   Physical Exam  Constitutional: She is oriented to person, place, and time. She appears well-developed and well-nourished. No distress.  HENT:  Head: Normocephalic and atraumatic.  Right Ear: External ear normal.  Left Ear: External ear normal.  Nose: Nose normal.  Mouth/Throat: Oropharynx is clear and moist. No oropharyngeal exudate.  Eyes: Conjunctivae and EOM are normal. Pupils are equal, round, and reactive to light. Right eye exhibits no discharge. Left eye exhibits no discharge. No scleral icterus.  Neck: Normal range of motion. Neck supple. No thyromegaly present.  No bruits nodes or thyroid enlargement  Cardiovascular: Normal rate, regular rhythm, normal heart sounds and intact distal pulses.   No murmur heard. At 72/m  Pulmonary/Chest: Effort normal and breath sounds normal. No respiratory distress. She has no wheezes. She has no rales. She exhibits no tenderness.  Abdominal: Soft. Bowel sounds are normal. She exhibits no mass. There is no tenderness. There is no rebound and no guarding.  Musculoskeletal: Normal range of motion. She exhibits tenderness. She exhibits no edema.  The patient had muscle soreness and tenderness around the pelvic area and the neck and shoulder area.  Lymphadenopathy:    She has no cervical adenopathy.  Neurological: She is alert and oriented to person, place, and time. She has normal reflexes. No cranial nerve deficit.  Skin: Skin is warm and dry. No rash noted.  Psychiatric: She has a normal mood  and affect. Her behavior is normal. Judgment and thought content normal.  Nursing note and vitals reviewed.  BP 133/85 mmHg  Pulse 80  Temp(Src) 97.3 F (36.3 C) (Oral)  Ht 5' 9"  (1.753 m)  Wt 221 lb (100.245 kg)  BMI 32.62 kg/m2  [facility] reading (PRIMARY) by  Dr. Brunilda Payor  x-ray-no active disease                                        Assessment & Plan:  1. HYPERTENSION, BENIGN -The patient's blood pressure is under good control today and she should continue with current treatment - POCT CBC; Future - BMP8+EGFR; Future - Hepatic function panel; Future - DG Chest 2 View; Future  2. Vitamin D deficiency -The vitamin D treatment will be dependent upon results of lab work done today. - POCT CBC; Future - Vit D  25 hydroxy (rtn osteoporosis monitoring); Future  3. Hyperlipemia -Patient is statin intolerant and she must continue with aggressive diet habits and exercise - POCT CBC; Future - NMR, lipoprofile; Future - DG Chest 2 View; Future  4. Metabolic syndrome -Kidney with diet and exercise - POCT CBC; Future - BMP8+EGFR; Future  5. Fibromyalgia -Continue with follow-up with fibromyalgia specialist  6. Multiple drug allergies -The patient was intolerant to recent medicines started by the fibromyalgia specialist.  Patient Instructions                       Medicare Annual Wellness Visit  [facility] and the medical providers at [facility] strive to bring you the best medical care.  In doing so we not only want to address your current medical conditions and concerns but also to detect new conditions early and prevent illness, disease and health-related problems.    Medicare offers a yearly Wellness Visit which allows our clinical staff to assess your need for preventative services including immunizations, lifestyle education, counseling to decrease risk of preventable diseases and screening for fall risk and other medical concerns.    This visit is provided free of charge (no copay) for all Medicare recipients. The clinical pharmacists at [facility] have begun to conduct these Wellness Visits which will also include a thorough review of all your medications.    As you primary medical provider  recommend that you make an appointment for your Annual Wellness Visit if you have not done so already this year.  You may set up this appointment before you leave today or you may call back [phone]) and schedule an appointment.  Please make sure when you call that you mention that you are scheduling your Annual Wellness Visit with the clinical pharmacist so that the appointment may be made for the proper length of time.     Continue current medications. Continue good therapeutic lifestyle changes which include good diet and exercise. Fall precautions discussed with patient. If an FOBT was given today- please return it to our front desk. If you are over 38 years old - you may need Prevnar 96 or the adult Pneumonia vaccine.  Flu Shots are still available at our office. If you still haven't had one please call to set up a nurse visit to get one.   After your visit with [place] today you will receive a survey in the mail or online from [employer] regarding your care with [place]. Please take  a moment to fill this out. Your feedback is very important to [place] as you can help [place] better understand your patient needs as well as improve your experience and satisfaction. WE CARE ABOUT YOU!!!   The patient should follow-up with the dermatologist as planned She should stay as active as possible exercising and walking every day and she should drink plenty of fluids this summer to keep from getting dehydrated Follow-up with fibromyalgia specialist as needed   Arrie Senate MD

## 2014-08-11 NOTE — Patient Instructions (Addendum)
Medicare Annual Wellness Visit  [facility] and the medical providers at [facility] strive to bring you the best medical care.  In doing so we not only want to address your current medical conditions and concerns but also to detect new conditions early and prevent illness, disease and health-related problems.    Medicare offers a yearly Wellness Visit which allows our clinical staff to assess your need for preventative services including immunizations, lifestyle education, counseling to decrease risk of preventable diseases and screening for fall risk and other medical concerns.    This visit is provided free of charge (no copay) for all Medicare recipients. The clinical pharmacists at [facility] have begun to conduct these Wellness Visits which will also include a thorough review of all your medications.    As you primary medical provider recommend that you make an appointment for your Annual Wellness Visit if you have not done so already this year.  You may set up this appointment before you leave today or you may call back [phone]) and schedule an appointment.  Please make sure when you call that you mention that you are scheduling your Annual Wellness Visit with the clinical pharmacist so that the appointment may be made for the proper length of time.     Continue current medications. Continue good therapeutic lifestyle changes which include good diet and exercise. Fall precautions discussed with patient. If an FOBT was given today- please return it to our front desk. If you are over 68 years old - you may need Prevnar 37 or the adult Pneumonia vaccine.  Flu Shots are still available at our office. If you still haven't had one please call to set up a nurse visit to get one.   After your visit with [place] today you will receive a survey in the mail or online from [employer] regarding your care with [place]. Please take a moment to  fill this out. Your feedback is very important to [place] as you can help [place] better understand your patient needs as well as improve your experience and satisfaction. WE CARE ABOUT YOU!!!   The patient should follow-up with the dermatologist as planned She should stay as active as possible exercising and walking every day and she should drink plenty of fluids this summer to keep from getting dehydrated Follow-up with fibromyalgia specialist as needed

## 2014-08-12 ENCOUNTER — Telehealth: Payer: Self-pay

## 2014-08-12 ENCOUNTER — Other Ambulatory Visit (INDEPENDENT_AMBULATORY_CARE_PROVIDER_SITE_OTHER): Payer: Commercial Managed Care - HMO

## 2014-08-12 DIAGNOSIS — E559 Vitamin D deficiency, unspecified: Secondary | ICD-10-CM

## 2014-08-12 DIAGNOSIS — I10 Essential (primary) hypertension: Secondary | ICD-10-CM

## 2014-08-12 DIAGNOSIS — E8881 Metabolic syndrome: Secondary | ICD-10-CM | POA: Diagnosis not present

## 2014-08-12 DIAGNOSIS — E785 Hyperlipidemia, unspecified: Secondary | ICD-10-CM

## 2014-08-12 LAB — POCT CBC
Granulocyte percent: 70.9 %G (ref 37–80)
HCT, POC: 45.5 % (ref 37.7–47.9)
Hemoglobin: 14.8 g/dL (ref 12.2–16.2)
Lymph, poc: 1.5 (ref 0.6–3.4)
MCH, POC: 28.4 pg (ref 27–31.2)
MCHC: 32.6 g/dL (ref 31.8–35.4)
MCV: 87.3 fL (ref 80–97)
MPV: 8.3 fL (ref 0–99.8)
POC GRANULOCYTE: 4.4 (ref 2–6.9)
POC LYMPH %: 24.7 % (ref 10–50)
Platelet Count, POC: 227 10*3/uL (ref 142–424)
RBC: 5.21 M/uL (ref 4.04–5.48)
RDW, POC: 12.8 %
WBC: 6.2 10*3/uL (ref 4.6–10.2)

## 2014-08-12 NOTE — Progress Notes (Signed)
Lab only 

## 2014-08-13 LAB — NMR, LIPOPROFILE
Cholesterol: 184 mg/dL (ref 100–199)
HDL CHOLESTEROL BY NMR: 37 mg/dL — AB (ref >39)
HDL Particle Number: 28 umol/L — ABNORMAL LOW (ref >=30.5)
LDL PARTICLE NUMBER: 1661 nmol/L — AB (ref <1000)
LDL SIZE: 20.3 nm (ref >20.5)
LDL-C: 120 mg/dL — AB (ref 0–99)
LP-IR Score: 77 — ABNORMAL HIGH (ref <=45)
Small LDL Particle Number: 942 nmol/L — ABNORMAL HIGH (ref <=527)
Triglycerides by NMR: 133 mg/dL (ref 0–149)

## 2014-08-13 LAB — BMP8+EGFR
BUN/Creatinine Ratio: 16 (ref 11–26)
BUN: 19 mg/dL (ref 8–27)
CHLORIDE: 100 mmol/L (ref 97–108)
CO2: 26 mmol/L (ref 18–29)
CREATININE: 1.22 mg/dL — AB (ref 0.57–1.00)
Calcium: 9.5 mg/dL (ref 8.7–10.3)
GFR calc non Af Amer: 46 mL/min/{1.73_m2} — ABNORMAL LOW (ref >59)
GFR, EST AFRICAN AMERICAN: 53 mL/min/{1.73_m2} — AB (ref >59)
Glucose: 98 mg/dL (ref 65–99)
Potassium: 4.5 mmol/L (ref 3.5–5.2)
Sodium: 141 mmol/L (ref 134–144)

## 2014-08-13 LAB — HEPATIC FUNCTION PANEL
ALBUMIN: 3.9 g/dL (ref 3.6–4.8)
ALT: 41 IU/L — ABNORMAL HIGH (ref 0–32)
AST: 25 IU/L (ref 0–40)
Alkaline Phosphatase: 101 IU/L (ref 39–117)
Bilirubin Total: 0.8 mg/dL (ref 0.0–1.2)
Bilirubin, Direct: 0.16 mg/dL (ref 0.00–0.40)
Total Protein: 6.6 g/dL (ref 6.0–8.5)

## 2014-08-13 LAB — VITAMIN D 25 HYDROXY (VIT D DEFICIENCY, FRACTURES): VIT D 25 HYDROXY: 21.6 ng/mL — AB (ref 30.0–100.0)

## 2014-09-01 DIAGNOSIS — D485 Neoplasm of uncertain behavior of skin: Secondary | ICD-10-CM | POA: Diagnosis not present

## 2014-09-01 DIAGNOSIS — L820 Inflamed seborrheic keratosis: Secondary | ICD-10-CM | POA: Diagnosis not present

## 2014-11-16 ENCOUNTER — Observation Stay (HOSPITAL_COMMUNITY)
Admission: EM | Admit: 2014-11-16 | Discharge: 2014-11-17 | Disposition: A | Discharge status: Home / Self Care | Payer: Commercial Managed Care - HMO | Attending: Internal Medicine | Admitting: Internal Medicine

## 2014-11-16 ENCOUNTER — Encounter (HOSPITAL_COMMUNITY): Payer: Self-pay | Admitting: Emergency Medicine

## 2014-11-16 DIAGNOSIS — N183 Chronic kidney disease, stage 3 (moderate): Secondary | ICD-10-CM | POA: Insufficient documentation

## 2014-11-16 DIAGNOSIS — E785 Hyperlipidemia, unspecified: Secondary | ICD-10-CM | POA: Diagnosis not present

## 2014-11-16 DIAGNOSIS — Z6831 Body mass index (BMI) 31.0-31.9, adult: Secondary | ICD-10-CM | POA: Diagnosis not present

## 2014-11-16 DIAGNOSIS — E669 Obesity, unspecified: Secondary | ICD-10-CM | POA: Insufficient documentation

## 2014-11-16 DIAGNOSIS — I129 Hypertensive chronic kidney disease with stage 1 through stage 4 chronic kidney disease, or unspecified chronic kidney disease: Secondary | ICD-10-CM | POA: Insufficient documentation

## 2014-11-16 DIAGNOSIS — R9431 Abnormal electrocardiogram [ECG] [EKG]: Secondary | ICD-10-CM

## 2014-11-16 DIAGNOSIS — I10 Essential (primary) hypertension: Secondary | ICD-10-CM | POA: Diagnosis present

## 2014-11-16 DIAGNOSIS — R079 Chest pain, unspecified: Secondary | ICD-10-CM | POA: Diagnosis present

## 2014-11-16 DIAGNOSIS — K219 Gastro-esophageal reflux disease without esophagitis: Secondary | ICD-10-CM | POA: Diagnosis not present

## 2014-11-16 DIAGNOSIS — I480 Paroxysmal atrial fibrillation: Secondary | ICD-10-CM | POA: Diagnosis not present

## 2014-11-16 DIAGNOSIS — R0789 Other chest pain: Secondary | ICD-10-CM | POA: Insufficient documentation

## 2014-11-16 DIAGNOSIS — Z7982 Long term (current) use of aspirin: Secondary | ICD-10-CM | POA: Diagnosis not present

## 2014-11-16 DIAGNOSIS — I4891 Unspecified atrial fibrillation: Secondary | ICD-10-CM | POA: Diagnosis not present

## 2014-11-16 DIAGNOSIS — N182 Chronic kidney disease, stage 2 (mild): Secondary | ICD-10-CM

## 2014-11-16 HISTORY — DX: Unspecified atrial fibrillation: I48.91

## 2014-11-16 LAB — URINALYSIS, ROUTINE W REFLEX MICROSCOPIC
Bilirubin Urine: NEGATIVE
GLUCOSE, UA: NEGATIVE mg/dL
HGB URINE DIPSTICK: NEGATIVE
KETONES UR: 15 mg/dL — AB
Nitrite: NEGATIVE
PROTEIN: NEGATIVE mg/dL
Specific Gravity, Urine: 1.008 (ref 1.005–1.030)
UROBILINOGEN UA: 0.2 mg/dL (ref 0.0–1.0)
pH: 6.5 (ref 5.0–8.0)

## 2014-11-16 LAB — CBC WITH DIFFERENTIAL/PLATELET
BASOS ABS: 0 10*3/uL (ref 0.0–0.1)
BASOS PCT: 0 %
EOS ABS: 0 10*3/uL (ref 0.0–0.7)
EOS PCT: 1 %
HCT: 42.5 % (ref 36.0–46.0)
HEMOGLOBIN: 14.6 g/dL (ref 12.0–15.0)
LYMPHS ABS: 1.6 10*3/uL (ref 0.7–4.0)
Lymphocytes Relative: 19 %
MCH: 29.6 pg (ref 26.0–34.0)
MCHC: 34.4 g/dL (ref 30.0–36.0)
MCV: 86 fL (ref 78.0–100.0)
Monocytes Absolute: 0.7 10*3/uL (ref 0.1–1.0)
Monocytes Relative: 8 %
NEUTROS PCT: 72 %
Neutro Abs: 6.4 10*3/uL (ref 1.7–7.7)
PLATELETS: 233 10*3/uL (ref 150–400)
RBC: 4.94 MIL/uL (ref 3.87–5.11)
RDW: 12.9 % (ref 11.5–15.5)
WBC: 8.8 10*3/uL (ref 4.0–10.5)

## 2014-11-16 LAB — BASIC METABOLIC PANEL
ANION GAP: 11 (ref 5–15)
BUN: 19 mg/dL (ref 6–20)
CALCIUM: 8.8 mg/dL — AB (ref 8.9–10.3)
CO2: 24 mmol/L (ref 22–32)
Chloride: 101 mmol/L (ref 101–111)
Creatinine, Ser: 1.11 mg/dL — ABNORMAL HIGH (ref 0.44–1.00)
GFR calc Af Amer: 58 mL/min — ABNORMAL LOW (ref >60)
GFR, EST NON AFRICAN AMERICAN: 50 mL/min — AB (ref >60)
GLUCOSE: 139 mg/dL — AB (ref 65–99)
Potassium: 3.4 mmol/L — ABNORMAL LOW (ref 3.5–5.1)
SODIUM: 136 mmol/L (ref 135–145)

## 2014-11-16 LAB — I-STAT TROPONIN, ED: TROPONIN I, POC: 0 ng/mL (ref 0.00–0.08)

## 2014-11-16 LAB — URINE MICROSCOPIC-ADD ON

## 2014-11-16 LAB — TSH: TSH: 0.599 u[IU]/mL (ref 0.350–4.500)

## 2014-11-16 LAB — TROPONIN I: Troponin I: <0.03 ng/mL (ref <0.031)

## 2014-11-16 LAB — MAGNESIUM: Magnesium: 1.8 mg/dL (ref 1.7–2.4)

## 2014-11-16 MED ORDER — HEPARIN BOLUS VIA INFUSION
4000.0000 [IU] | Freq: Once | INTRAVENOUS | Status: AC
  Administered 2014-11-16: 4000 [IU] via INTRAVENOUS
  Filled 2014-11-16: qty 4000

## 2014-11-16 MED ORDER — ASPIRIN 81 MG PO CHEW
324.0000 mg | CHEWABLE_TABLET | Freq: Once | ORAL | Status: AC
  Administered 2014-11-16: 324 mg via ORAL
  Filled 2014-11-16: qty 4

## 2014-11-16 MED ORDER — PROPOFOL 10 MG/ML IV BOLUS
INTRAVENOUS | Status: DC | PRN
Start: 2014-11-16 — End: 2014-11-17
  Administered 2014-11-16: 100 mg via INTRAVENOUS

## 2014-11-16 MED ORDER — ACETAMINOPHEN 325 MG PO TABS: 650.0000 mg | ORAL_TABLET | ORAL | Status: DC | PRN

## 2014-11-16 MED ORDER — ASPIRIN EC 81 MG PO TBEC
81.0000 mg | DELAYED_RELEASE_TABLET | Freq: Every day | ORAL | Status: DC
  Administered 2014-11-17: 81 mg via ORAL
  Filled 2014-11-16: qty 1

## 2014-11-16 MED ORDER — PROPOFOL 10 MG/ML IV BOLUS
INTRAVENOUS | Status: AC
  Filled 2014-11-16: qty 20

## 2014-11-16 MED ORDER — POTASSIUM CHLORIDE CRYS ER 20 MEQ PO TBCR
40.0000 meq | EXTENDED_RELEASE_TABLET | Freq: Once | ORAL | Status: AC
  Administered 2014-11-16: 40 meq via ORAL
  Filled 2014-11-16: qty 2

## 2014-11-16 MED ORDER — HYDROCHLOROTHIAZIDE 25 MG PO TABS
12.5000 mg | ORAL_TABLET | Freq: Every day | ORAL | Status: DC
  Administered 2014-11-17: 12.5 mg via ORAL
  Filled 2014-11-16: qty 1

## 2014-11-16 MED ORDER — HEPARIN (PORCINE) IN NACL 100-0.45 UNIT/ML-% IJ SOLN
1450.0000 [IU]/h | INTRAMUSCULAR | Status: DC
  Administered 2014-11-16: 1450 [IU]/h via INTRAVENOUS
  Filled 2014-11-16: qty 250

## 2014-11-16 MED ORDER — SODIUM CHLORIDE 0.9 % IV BOLUS (SEPSIS)
1000.0000 mL | Freq: Once | INTRAVENOUS | Status: AC
  Administered 2014-11-16: 1000 mL via INTRAVENOUS

## 2014-11-16 MED ORDER — ONDANSETRON HCL 4 MG/2ML IJ SOLN: 4.0000 mg | Freq: Four times a day (QID) | INTRAMUSCULAR | Status: DC | PRN

## 2014-11-16 NOTE — ED Provider Notes (Signed)
CSN: [id number]     Arrival date & time 11/16/14  1912 History   First MD Initiated Contact with Patient 11/16/14 1920     Chief Complaint  Patient presents with  . Chest Pain     (Consider location/radiation/quality/duration/timing/severity/associated sxs/prior Treatment) Patient is a 69 y.o. female presenting with chest pain and palpitations. The history is provided by the patient.  Chest Pain Associated symptoms: palpitations   Palpitations Palpitations quality:  Irregular Onset quality:  Sudden Duration:  2 hours Timing:  Constant Progression:  Unchanged Chronicity:  Recurrent Relieved by:  Nothing Worsened by:  Nothing Ineffective treatments:  None tried Associated symptoms: chest pain (tightness)   Risk factors: hx of atrial fibrillation (3x in 8 years)   Risk factors: no hypercoagulable state     Past Medical History  Diagnosis Date  . Fibromyalgia     Not currently under active treatment  . GERD (gastroesophageal reflux disease)   . Hiatal hernia   . Hypertension   . Dyslipidemia   . Obesity   . Essential tremor 01/12/2014  . Afib [facility])    Past Surgical History  Procedure Laterality Date  . Abdominal hysterectomy     Family History  Problem Relation Age of Onset  . Coronary artery disease Mother   . Heart disease Mother     CABG  . Cancer Mother   . COPD Father   . Hepatitis Sister     autoimune  . Diabetes Sister    Social History  Substance Use Topics  . Smoking status: Never Smoker   . Smokeless tobacco: Never Used  . Alcohol Use: No   OB History    No data available     Review of Systems  Cardiovascular: Positive for chest pain (tightness) and palpitations.  All other systems reviewed and are negative.     Allergies  Benicar; Penicillins; Sulfonamide derivatives; Celebrex; Cymbalta; Neurontin; Norvasc; Prozac; Xanax; Zoloft; and Zyprexa  Home Medications   Prior to Admission medications   Medication Sig Start Date End Date  Taking? Authorizing Provider  aspirin EC 81 MG tablet Take 81 mg by mouth daily.    Historical Provider, MD  hydrochlorothiazide (HYDRODIURIL) 25 MG tablet Take 1 tablet (25 mg total) by mouth daily. 01/28/14   Chipper Herb, MD   BP 140/97 mmHg  Pulse 80  Temp(Src) 98 F (36.7 C) (Oral)  Resp 18  Ht 5\' 10"  (1.778 m)  Wt 222 lb (100.699 kg)  BMI 31.85 kg/m2  SpO2 96% Physical Exam  Constitutional: She is oriented to person, place, and time. She appears well-developed and well-nourished. She appears ill.  HENT:  Head: Normocephalic.  Eyes: Conjunctivae are normal.  Neck: Neck supple. No tracheal deviation present.  Cardiovascular: Normal heart sounds.  An irregularly irregular rhythm present. Tachycardia present.   Pulmonary/Chest: Effort normal and breath sounds normal. No respiratory distress.  Abdominal: Soft. She exhibits no distension. There is no tenderness.  Neurological: She is alert and oriented to person, place, and time. GCS eye subscore is 4. GCS verbal subscore is 5. GCS motor subscore is 6.  Skin: Skin is warm. She is diaphoretic.  Slightly delayed cap refill 2-3 sec  Psychiatric: She has a normal mood and affect.    ED Course  CARDIOVERSION Date/Time: 11/16/2014 7:37 PM Performed by: Leo Grosser Authorized by: Leo Grosser Consent: Verbal consent obtained. Written consent obtained. Risks and benefits: risks, benefits and alternatives were discussed Consent given by: patient Patient understanding: patient states understanding  of the procedure being performed Patient consent: the patient's understanding of the procedure matches consent given Procedure consent: procedure consent matches procedure scheduled Relevant documents: relevant documents present and verified Required items: required blood products, implants, devices, and special equipment available Patient identity confirmed: verbally with patient, arm band, provided demographic data and hospital-assigned  identification number Time out: Immediately prior to procedure a "time out" was called to verify the correct patient, procedure, equipment, support staff and site/side marked as required. Patient sedated: yes Sedation type: moderate (conscious) sedation Sedatives: propofol Vitals: Vital signs were monitored during sedation. Cardioversion basis: emergent Pre-procedure rhythm: atrial fibrillation Patient position: patient was placed in a supine position Chest area: chest area exposed Electrodes: pads Electrodes placed: anterior-posterior Number of attempts: 1 Attempt 1 mode: synchronous Attempt 1 waveform: biphasic Attempt 1 shock (in Joules): 200 Attempt 1 outcome: conversion to normal sinus rhythm Post-procedure rhythm: normal sinus rhythm Complications: no complications Patient tolerance: Patient tolerated the procedure well with no immediate complications   Procedural sedation Performed by: Leo Grosser Consent: Verbal consent obtained. Risks and benefits: risks, benefits and alternatives were discussed Required items: required blood products, implants, devices, and special equipment available Patient identity confirmed: arm band and provided demographic data Time out: Immediately prior to procedure a "time out" was called to verify the correct patient, procedure, equipment, support staff and site/side marked as required.  Sedation type: moderate (conscious) sedation NPO time confirmed and considedered  Sedatives: PROPOFOL  Physician Time at Bedside: 30 minutes  Vitals: Vital signs were monitored during sedation. Cardiac Monitor, pulse oximeter Patient tolerance: Patient tolerated the procedure well with no immediate complications. Comments: Used 100 mg (1 mg/kg). Pt required bagging with ambu for transient apnea and desat, recovered without complication. Returned to pre-procedural sedation baseline   CRITICAL CARE Performed by: Leo Grosser Total critical care time:  30 minutes Critical care time was exclusive of separately billable procedures and treating other patients. Critical care was necessary to treat or prevent imminent or life-threatening deterioration. Critical care was time spent personally by me on the following activities: development of treatment plan with patient and/or surrogate as well as nursing, discussions with consultants, evaluation of patient's response to treatment, examination of patient, obtaining history from patient or surrogate, ordering and performing treatments and interventions, ordering and review of laboratory studies, ordering and review of radiographic studies, pulse oximetry and re-evaluation of patient's condition.   Labs Review Labs Reviewed  URINALYSIS, ROUTINE W REFLEX MICROSCOPIC (NOT AT [facility]) - Abnormal; Notable for the following:    Color, Urine STRAW (*)    Ketones, ur 15 (*)    Leukocytes, UA TRACE (*)    All other components within normal limits  BASIC METABOLIC PANEL - Abnormal; Notable for the following:    Potassium 3.4 (*)    Glucose, Bld 139 (*)    Creatinine, Ser 1.11 (*)    Calcium 8.8 (*)    GFR calc non Af Amer 50 (*)    GFR calc Af Amer 58 (*)    All other components within normal limits  URINE MICROSCOPIC-ADD ON - Abnormal; Notable for the following:    Squamous Epithelial / LPF FEW (*)    Bacteria, UA FEW (*)    All other components within normal limits  CBC WITH DIFFERENTIAL/PLATELET  TSH  MAGNESIUM  TROPONIN I  TROPONIN I  HEPARIN LEVEL (UNFRACTIONATED)  TROPONIN I  TROPONIN I  CBC  I-STAT TROPOININ, ED    Imaging Review No results found. I  have personally reviewed and evaluated these images and lab results as part of my medical decision-making.   EKG Interpretation   Date/Time:  Tuesday November 16 2014 20:40:55 EDT Ventricular Rate:  81 PR Interval:  183 QRS Duration: 93 QT Interval:  390 QTC Calculation: 453 R Axis:   43 Text Interpretation:  Sinus rhythm Probable  anteroseptal infarct, old  Since last tracing ST depression improved Confirmed by Chantrice Hagg MD, [facility]  [phone]) on 11/16/2014 9:23:30 PM     Previous ECGs available in [facility] 1: A-fib RVR with rate of 201, repolarization abnormalities 2: NSR with diffuse ST depressions s/p cardioversion 3: as above  MDM   Final diagnoses:  Atrial fibrillation with RVR (HCC)  Chest pain, unspecified chest pain type  Abnormal finding on EKG  Atrial fibrillation status post cardioversion [facility])    69 y.o. female presents with sudden onset recurrent atrial fibrillation that occurred at 5:30 PM and was noted by patient as sudden onset. Has had stable BP en route with EMS, had runs of ventricular tachycardia, rate 180-220 in ED without hypotension. Has chest pressure on arrival suspect related to demand from extreme heart rate. Pt consented and cardioverted back into sinus rhythm as above in an uncomplicated fashion with exception of brief apneic period that responded to oxygen and stimulation during sedation. D/w cardiology who recommended the Pt be monitored and have serial enzymes as she had evidence of diffuse depressions following procedure. Was given aspirin shortly after arrival prior to cardioversion and heparin started d/t EKG findings. Hospitalist was consulted for admission and will see the patient in the emergency department with plan for cardiology consultation and serial enzymes to contact them immediately if they turn positive.     Leo Grosser, MD 11/17/14 [phone]

## 2014-11-16 NOTE — ED Notes (Signed)
FAMILY AT BEDSIDE, UPDATED BY DR. Laneta Simmers

## 2014-11-16 NOTE — ED Notes (Signed)
RT called to assist with cardioversion. IV patent, pt on zoll pads. HR 212

## 2014-11-16 NOTE — ED Notes (Signed)
Per ems-- pt c/o chest tightness that started at 5pm. Pt found to be in a-fib RVR. Pt also had 2 episodes of v-tach (6 beats). Asymptomatic of v-tach. Pt a&ox4. 500cc ns, 6mg  and 12mg  of adenosine pta. MD at bedside. Preparing to cardiovert pt.

## 2014-11-16 NOTE — Progress Notes (Signed)
Called to bedside for cardioversion. Pt alert and oriented, stable on 5L [place]. HR 175-197. Time out performed with MDs Laneta Simmers and Jeralyn Ruths at bedside. RT at the ready with BMV for airway assistance. Drugs administered per RN, cardioversion completed with no complications. Pt is alert and oriented, mentating and answering questions appropriately s/p cardioversion. HR in the 80s. VSS and NAD noted at this time.

## 2014-11-16 NOTE — Sedation Documentation (Signed)
Pt conversing freely with family at bedside, VSS pt alert and oriented.

## 2014-11-16 NOTE — Sedation Documentation (Addendum)
defib at 200J-

## 2014-11-16 NOTE — ED Notes (Addendum)
Mini lab called for update on istat troponin, states wasn't received. Main lab called for add on regular troponin.

## 2014-11-16 NOTE — Sedation Documentation (Addendum)
Pt had brief apneic period, RT applied ambu bag, Dr. Laneta Simmers applied sternal rub. Pt responsive to pain, pt currently coughing. Oxygen sats increased from 85% to 96% with assisted ventilation.

## 2014-11-16 NOTE — Sedation Documentation (Signed)
resp at bedside. Oxygen set up, pt on monitor.

## 2014-11-16 NOTE — Progress Notes (Signed)
ANTICOAGULATION CONSULT NOTE - Initial Consult  Pharmacy Consult for heparin Indication: atrial fibrillation  Allergies  Allergen Reactions  . Benicar [Olmesartan]     rash  . Penicillins   . Sulfonamide Derivatives   . Celebrex [Celecoxib] Anxiety  . Cymbalta [Duloxetine Hcl] Itching  . Neurontin [Gabapentin] Nausea Only  . Norvasc [Amlodipine Besylate] Swelling  . Prozac [Fluoxetine Hcl] Other (See Comments)  . Xanax [Alprazolam] Other (See Comments)  . Zoloft [Sertraline Hcl] Itching  . Zyprexa [Olanzapine] Other (See Comments)    Wt gain    Patient Measurements: Height: 5\' 10"  (177.8 cm) (pt reported) Weight: 213 lb (96.616 kg) (pt reported) IBW/kg (Calculated) : 68.5 Heparin Dosing Weight: 89 kg  Vital Signs: Temp: 97.8 F (36.6 C) (10/04 1925) Temp Source: Oral (10/04 1925) BP: 118/75 mmHg (10/04 1940) Pulse Rate: 72 (10/04 1940)  Labs:  Recent Labs  11/16/14 1920  HGB 14.6  HCT 42.5  PLT 233  CREATININE 1.11*    Estimated Creatinine Clearance: 61.9 mL/min (by C-G formula based on Cr of 1.11).   Medical History: Past Medical History  Diagnosis Date  . Fibromyalgia     Not currently under active treatment  . GERD (gastroesophageal reflux disease)   . Hiatal hernia   . Hypertension   . Dyslipidemia   . Obesity   . Essential tremor 01/12/2014  . Afib ([facility])     Medications:   (Not in a hospital admission)  Assessment: 67 yoF with paroxysmal afib of < 24h duration. Pt underwent cardioversion at [facility] on 10/4 and is currently in sinus rhythm.   CHEST guidelines recommend therapeutic anticoagulation for at least 4 weeks after successful cardioversion to sinus rhythm, rather than no anticoagulation, regardless of the baseline risk of stroke (Grade 1B)  Hgb 14.6, Plt 233  Goal of Therapy:  INR 2-3 Monitor platelets by anticoagulation protocol: Yes   Plan:  Give 4000 units bolus x 1 Start heparin infusion at 1450 units/hr Check anti-Xa level  with AM labs and daily while on heparin Continue to monitor H&H and platelets F/U plans for long-term anticoagulation  Governor Specking, PharmD Clinical Pharmacy Resident Pager: [phone] 11/16/2014,8:03 PM

## 2014-11-16 NOTE — H&P (Signed)
[facility] History and Physical  Alison Garrett NWG:[id number] DOB: 11/25/1945 DOA: 11/16/2014  Referring physician: EDP PCP: Redge Gainer, MD   Chief Complaint: Chest tightness   HPI: Alison Garrett is a 69 y.o. female who presents to the ED with c/o chest tightness.  Symptoms onset at 5pm today.  There are associated palpitations with tachycardia.  Symptoms lasted for 2 hours after which she came in to the ED.  In the ED she was noted to have HR of 180s-200s.  A.Fib RVR, cardioverted in ED, now in NSR.  Review of Systems: Systems reviewed.  As above, otherwise negative  Past Medical History  Diagnosis Date  . Fibromyalgia     Not currently under active treatment  . GERD (gastroesophageal reflux disease)   . Hiatal hernia   . Hypertension   . Dyslipidemia   . Obesity   . Essential tremor 01/12/2014  . Afib Alison Garrett)    Past Surgical History  Procedure Laterality Date  . Abdominal hysterectomy     Social History:  reports that she has never smoked. She has never used smokeless tobacco. She reports that she does not drink alcohol or use illicit drugs.  Allergies  Allergen Reactions  . Contrast Media [Iodinated Diagnostic Agents] Anaphylaxis  . Penicillins Anaphylaxis  . Benicar [Olmesartan]     rash  . Sulfonamide Derivatives   . Celebrex [Celecoxib] Anxiety  . Cymbalta [Duloxetine Hcl] Itching  . Neurontin [Gabapentin] Nausea Only  . Norvasc [Amlodipine Besylate] Swelling  . Prozac [Fluoxetine Hcl] Other (See Comments)  . Xanax [Alprazolam] Other (See Comments)  . Zoloft [Sertraline Hcl] Itching  . Zyprexa [Olanzapine] Other (See Comments)    Wt gain    Family History  Problem Relation Age of Onset  . Coronary artery disease Mother   . Heart disease Mother     CABG  . Cancer Mother   . COPD Father   . Hepatitis Sister     autoimune  . Diabetes Sister      Prior to Admission medications   Medication Sig Start Date End Date Taking? Authorizing Provider   aspirin EC 81 MG tablet Take 81 mg by mouth daily.   Yes Historical Provider, MD  hydrochlorothiazide (HYDRODIURIL) 25 MG tablet Take 1 tablet (25 mg total) by mouth daily. Patient taking differently: Take 12.5 mg by mouth daily.  01/28/14  Yes Chipper Herb, MD   Physical Exam: Filed Vitals:   11/16/14 2115  BP: 128/79  Pulse: 74  Temp:   Resp: 11    BP 128/79 mmHg  Pulse 74  Temp(Src) 97.8 F (36.6 C) (Oral)  Resp 11  Ht 5\' 10"  (1.778 Garrett)  Wt 96.616 kg (213 lb)  BMI 30.56 kg/m2  SpO2 96%  General Appearance:    Alert, oriented, no distress, appears stated age  Head:    Normocephalic, atraumatic  Eyes:    PERRL, EOMI, sclera non-icteric        Nose:   Nares without drainage or epistaxis. Mucosa, turbinates normal  Throat:   Moist mucous membranes. Oropharynx without erythema or exudate.  Neck:   Supple. No carotid bruits.  No thyromegaly.  No lymphadenopathy.   Back:     No CVA tenderness, no spinal tenderness  Lungs:     Clear to auscultation bilaterally, without wheezes, rhonchi or rales  Chest wall:    No tenderness to palpitation  Heart:    Regular rate and rhythm without murmurs, gallops, rubs  Abdomen:  Soft, non-tender, nondistended, normal bowel sounds, no organomegaly  Genitalia:    deferred  Rectal:    deferred  Extremities:   No clubbing, cyanosis or edema.  Pulses:   2+ and symmetric all extremities  Skin:   Skin color, texture, turgor normal, no rashes or lesions  Lymph nodes:   Cervical, supraclavicular, and axillary nodes normal  Neurologic:   CNII-XII intact. Normal strength, sensation and reflexes      throughout    Labs on Admission:  Basic Metabolic Panel:  Recent Labs Lab 11/16/14 1920  NA 136  K 3.4*  CL 101  CO2 24  GLUCOSE 139*  BUN 19  CREATININE 1.11*  CALCIUM 8.8*  MG 1.8   Liver Function Tests: No results for input(s): AST, ALT, ALKPHOS, BILITOT, PROT, ALBUMIN in the last 168 hours. No results for input(s): LIPASE,  AMYLASE in the last 168 hours. No results for input(s): AMMONIA in the last 168 hours. CBC:  Recent Labs Lab 11/16/14 1920  WBC 8.8  NEUTROABS 6.4  HGB 14.6  HCT 42.5  MCV 86.0  PLT 233   Cardiac Enzymes:  Recent Labs Lab 11/16/14 1939  TROPONINI <0.03    BNP (last 3 results) No results for input(s): PROBNP in the last 8760 hours. CBG: No results for input(s): GLUCAP in the last 168 hours.  Radiological Exams on Admission: No results found.  EKG: Independently reviewed.  Assessment/Plan Principal Problem:   Atrial fibrillation with RVR (HCC)   1. A.Fib RVR - 1. Admit for obs and serial trops per cardiology request    Code Status: Full Code  Family Communication: No family in room Disposition Plan: Admit to obs   Time spent: 30 min  Alison Garrett. [facility] Pager [phone]  If 7AM-7PM, please contact the day team taking care of the patient [email or web address] Password [email or web address] 11/16/2014, 10:35 PM

## 2014-11-17 ENCOUNTER — Other Ambulatory Visit: Payer: Self-pay | Admitting: *Deleted

## 2014-11-17 DIAGNOSIS — Z6831 Body mass index (BMI) 31.0-31.9, adult: Secondary | ICD-10-CM | POA: Diagnosis not present

## 2014-11-17 DIAGNOSIS — N183 Chronic kidney disease, stage 3 (moderate): Secondary | ICD-10-CM

## 2014-11-17 DIAGNOSIS — I10 Essential (primary) hypertension: Secondary | ICD-10-CM | POA: Diagnosis not present

## 2014-11-17 DIAGNOSIS — Z7982 Long term (current) use of aspirin: Secondary | ICD-10-CM | POA: Diagnosis not present

## 2014-11-17 DIAGNOSIS — I4891 Unspecified atrial fibrillation: Secondary | ICD-10-CM

## 2014-11-17 DIAGNOSIS — N182 Chronic kidney disease, stage 2 (mild): Secondary | ICD-10-CM

## 2014-11-17 DIAGNOSIS — K219 Gastro-esophageal reflux disease without esophagitis: Secondary | ICD-10-CM | POA: Diagnosis not present

## 2014-11-17 DIAGNOSIS — E669 Obesity, unspecified: Secondary | ICD-10-CM | POA: Diagnosis not present

## 2014-11-17 DIAGNOSIS — R0789 Other chest pain: Secondary | ICD-10-CM | POA: Diagnosis not present

## 2014-11-17 DIAGNOSIS — I129 Hypertensive chronic kidney disease with stage 1 through stage 4 chronic kidney disease, or unspecified chronic kidney disease: Secondary | ICD-10-CM | POA: Diagnosis not present

## 2014-11-17 DIAGNOSIS — I480 Paroxysmal atrial fibrillation: Secondary | ICD-10-CM | POA: Diagnosis not present

## 2014-11-17 DIAGNOSIS — E785 Hyperlipidemia, unspecified: Secondary | ICD-10-CM | POA: Diagnosis not present

## 2014-11-17 LAB — TROPONIN I: TROPONIN I: 0.03 ng/mL (ref <0.031)

## 2014-11-17 LAB — CBC
HCT: 39.9 % (ref 36.0–46.0)
Hemoglobin: 13.4 g/dL (ref 12.0–15.0)
MCH: 28.8 pg (ref 26.0–34.0)
MCHC: 33.6 g/dL (ref 30.0–36.0)
MCV: 85.6 fL (ref 78.0–100.0)
PLATELETS: 213 10*3/uL (ref 150–400)
RBC: 4.66 MIL/uL (ref 3.87–5.11)
RDW: 13 % (ref 11.5–15.5)
WBC: 7.5 10*3/uL (ref 4.0–10.5)

## 2014-11-17 LAB — HEPARIN LEVEL (UNFRACTIONATED): HEPARIN UNFRACTIONATED: 0.38 [IU]/mL (ref 0.30–0.70)

## 2014-11-17 NOTE — Progress Notes (Signed)
Patient alert oriented, denies pain, no shortness. V/S stable. Discharge instruction given, all questions answered. Patient verbalized understanding. Patient d/c per order.

## 2014-11-17 NOTE — Progress Notes (Signed)
[place] for heparin Indication: atrial fibrillation  Allergies  Allergen Reactions  . Contrast Media [Iodinated Diagnostic Agents] Anaphylaxis  . Penicillins Anaphylaxis  . Benicar [Olmesartan]     rash  . Sulfonamide Derivatives   . Celebrex [Celecoxib] Anxiety  . Cymbalta [Duloxetine Hcl] Itching  . Neurontin [Gabapentin] Nausea Only  . Norvasc [Amlodipine Besylate] Swelling  . Prozac [Fluoxetine Hcl] Other (See Comments)  . Xanax [Alprazolam] Other (See Comments)  . Zoloft [Sertraline Hcl] Itching  . Zyprexa [Olanzapine] Other (See Comments)    Wt gain    Patient Measurements: Height: 5\' 10"  (177.8 cm) Weight: 222 lb (100.699 kg) IBW/kg (Calculated) : 68.5 Heparin Dosing Weight: 89 kg  Vital Signs: Temp: 98 F (36.7 C) (10/04 2342) Temp Source: Oral (10/04 2342) BP: 140/97 mmHg (10/04 2342) Pulse Rate: 80 (10/04 2342)  Labs:  Recent Labs  11/16/14 1920 11/16/14 1939 11/16/14 2354 11/17/14 0233  HGB 14.6  --   --  13.4  HCT 42.5  --   --  39.9  PLT 233  --   --  213  HEPARINUNFRC  --   --   --  0.38  CREATININE 1.11*  --   --   --   TROPONINI  --  <0.03 <0.03  --     Estimated Creatinine Clearance: 63.2 mL/min (by C-G formula based on Cr of 1.11).  Assessment: 69 y.o. female with Afib for heparin  Goal of Therapy:  INR 2-3 Monitor platelets by anticoagulation protocol: Yes   Plan:  Continue Heparin at current rate  F/U plan for oral anticoagulation  Phillis Knack, PharmD, BCPS  11/17/2014,3:21 AM

## 2014-11-17 NOTE — Discharge Summary (Addendum)
Physician Discharge Summary  Alison Garrett DQQ:[id number] DOB: 10-01-1945 DOA: 11/16/2014  PCP: Redge Gainer, MD  Admit date: 11/16/2014 Discharge date: 11/17/2014  Time spent: 50 minutes  Recommendations for Outpatient Follow-up:  1. F/u with cardiology  Discharge Condition: Stable   Discharge Diagnoses:  Principal Problem:   Atrial fibrillation with RVR ([place]) Active Problems:   HYPERTENSION, BENIGN   Chronic kidney disease, stage 3   History of present illness:  69 year old female with a history of hypertension and CK D3 presents to the ER with complaint of palpitations and chest tightness. She has a history of atrial fibrillation but in the past 3 years she stated she only had 3 episodes in her cardiologist had decided not to put her on a rate controlling agent or anticoagulation. In the ER she was cardioverted and regained normal sinus rhythm. She was admitted for further management  Hospital Course:  A-fib with RVR s/p cardioversion -EKG after cardioversion revealed mild diffuse ST depressions and it was recommended by cardiology that she be monitored overnight on a heparin infusion with serial troponin-troponin have remained negative- - she had a second short lived episode of A-fib overnight and converted back to sinus rhythm - CHA2DS2-VASc Score 3- hypertension, sex, age - I have discussed placing her on a rate controlling agent and considering anticoagulation- she states that she does not want either. I explained that placing her on a rate controlling agent would treat HTN as well and she could be transitioned off of HCTZ. She maintains that she is reluctant to do this. She does not want to start any new medications. She refuses anticoagulation other than an aspirin a day. I have recommended a cardiology consult while she is in the hospital so that she can be further explained the benefits of changing treatment but she is refusing and states she would like to go home. Tells me  that she will discuss it will her PCP in the next week.   Chronic kidney disease stage III -Stable  Essential hypertension -Continue HCTZ  Procedures:  Cardioversion in the ER  Discharge Exam: Filed Weights   11/16/14 1940 11/16/14 2342 11/17/14 0408  Weight: 96.616 kg (213 lb) 100.699 kg (222 lb) 100.744 kg (222 lb 1.6 oz)   Filed Vitals:   11/17/14 0408  BP: 118/86  Pulse: 73  Temp: 98.1 F (36.7 C)  Resp: 18    General: AAO x 3, no distress Cardiovascular: RRR, no murmurs  Respiratory: clear to auscultation bilaterally GI: soft, non-tender, non-distended, bowel sound positive  Discharge Instructions You were cared for by a hospitalist during your hospital stay. If you have any questions about your discharge medications or the care you received while you were in the hospital after you are discharged, you can call the unit and asked to speak with the hospitalist on call if the hospitalist that took care of you is not available. Once you are discharged, your primary care physician will handle any further medical issues. Please note that NO REFILLS for any discharge medications will be authorized once you are discharged, as it is imperative that you return to your primary care physician (or establish a relationship with a primary care physician if you do not have one) for your aftercare needs so that they can reassess your need for medications and monitor your lab values.  Discharge Instructions    Diet - low sodium heart healthy    Complete by:  As directed      Increase activity slowly  Complete by:  As directed             Medication List    TAKE these medications        aspirin EC 81 MG tablet  Take 81 mg by mouth daily.     hydrochlorothiazide 25 MG tablet  Commonly known as:  HYDRODIURIL  Take 1 tablet (25 mg total) by mouth daily.       Allergies  Allergen Reactions  . Contrast Media [Iodinated Diagnostic Agents] Anaphylaxis  . Penicillins  Anaphylaxis  . Benicar [Olmesartan]     rash  . Sulfonamide Derivatives   . Celebrex [Celecoxib] Anxiety  . Cymbalta [Duloxetine Hcl] Itching  . Neurontin [Gabapentin] Nausea Only  . Norvasc [Amlodipine Besylate] Swelling  . Prozac [Fluoxetine Hcl] Other (See Comments)  . Xanax [Alprazolam] Other (See Comments)  . Zoloft [Sertraline Hcl] Itching  . Zyprexa [Olanzapine] Other (See Comments)    Wt gain      The results of significant diagnostics from this hospitalization (including imaging, microbiology, ancillary and laboratory) are listed below for reference.    Significant Diagnostic Studies: No results found.  Microbiology: No results found for this or any previous visit (from the past 240 hour(s)).   Labs: Basic Metabolic Panel:  Recent Labs Lab 11/16/14 1920  NA 136  K 3.4*  CL 101  CO2 24  GLUCOSE 139*  BUN 19  CREATININE 1.11*  CALCIUM 8.8*  MG 1.8   Liver Function Tests: No results for input(s): AST, ALT, ALKPHOS, BILITOT, PROT, ALBUMIN in the last 168 hours. No results for input(s): LIPASE, AMYLASE in the last 168 hours. No results for input(s): AMMONIA in the last 168 hours. CBC:  Recent Labs Lab 11/16/14 1920 11/17/14 0233  WBC 8.8 7.5  NEUTROABS 6.4  --   HGB 14.6 13.4  HCT 42.5 39.9  MCV 86.0 85.6  PLT 233 213   Cardiac Enzymes:  Recent Labs Lab 11/16/14 1939 11/16/14 2354 11/17/14 0233 11/17/14 0603  TROPONINI <0.03 <0.03 <0.03 0.03   BNP: BNP (last 3 results) No results for input(s): BNP in the last 8760 hours.  ProBNP (last 3 results) No results for input(s): PROBNP in the last 8760 hours.  CBG: No results for input(s): GLUCAP in the last 168 hours.     SignedDebbe Odea, MD [facility] 11/17/2014, 8:14 AM

## 2014-11-19 ENCOUNTER — Encounter: Payer: Self-pay | Admitting: Family Medicine

## 2014-11-19 ENCOUNTER — Ambulatory Visit (INDEPENDENT_AMBULATORY_CARE_PROVIDER_SITE_OTHER): Payer: Commercial Managed Care - HMO | Admitting: Family Medicine

## 2014-11-19 ENCOUNTER — Other Ambulatory Visit: Payer: Self-pay | Admitting: Family Medicine

## 2014-11-19 VITALS — BP 136/96 | HR 96 | Temp 97.1°F | Ht 70.0 in | Wt 221.0 lb

## 2014-11-19 DIAGNOSIS — Z889 Allergy status to unspecified drugs, medicaments and biological substances status: Secondary | ICD-10-CM | POA: Diagnosis not present

## 2014-11-19 DIAGNOSIS — I10 Essential (primary) hypertension: Secondary | ICD-10-CM | POA: Diagnosis not present

## 2014-11-19 DIAGNOSIS — M797 Fibromyalgia: Secondary | ICD-10-CM

## 2014-11-19 DIAGNOSIS — I480 Paroxysmal atrial fibrillation: Secondary | ICD-10-CM | POA: Diagnosis not present

## 2014-11-19 MED ORDER — METOPROLOL SUCCINATE ER 50 MG PO TB24: 50.0000 mg | ORAL_TABLET | Freq: Every day | ORAL | Status: DC

## 2014-11-19 MED ORDER — APIXABAN 5 MG PO TABS: 5.0000 mg | ORAL_TABLET | Freq: Two times a day (BID) | ORAL | Status: DC

## 2014-11-19 NOTE — Progress Notes (Signed)
Subjective:    Patient ID: Alison Garrett, female    DOB: 01-05-1946, 69 y.o.   MRN: [id number]  HPI Patient here today for hospital follow up from [place]. She was seen for Atrial fibrillation. She has a follow up cardio appointment with Dr Percival Spanish on Dec 15, 2014. The patient had a rapid ventricular rate in the 170 range. The patient was in the hospital less than 24 hours and had cardioversion. She was discharged 2 days ago. She was discharged only on her HCTZ 25 mg one half daily. She has an appointment with cardiologist in less than a month. Since her discharge she is has some chest wall soreness from the cardioversion but has not had any chest pain shortness of breath trouble swallowing GI symptoms nausea vomiting diarrhea or blood in the stool. Other than some fatigue she feels like she is pretty much back to her usual condition. She has not noticed any further rapid heart rates.     Patient Active Problem List   Diagnosis Date Noted  . Chronic kidney disease, stage 3 11/17/2014  . Atrial fibrillation with RVR ([place]) 11/16/2014  . Multiple drug allergies 08/11/2014  . Fatigue 01/12/2014  . Essential tremor 01/12/2014  . Metabolic syndrome 54/00/8676  . Hyperlipemia 09/17/2012  . Vitamin D deficiency 09/17/2012  . Fibromyalgia 09/17/2012  . Atrial fibrillation ([place]) 07/11/2011  . OBESITY, UNSPECIFIED 12/01/2008  . HYPERTENSION, BENIGN 12/01/2008  . ABNORMAL ELECTROCARDIOGRAM 12/01/2008   Outpatient Encounter Prescriptions as of 11/19/2014  Medication Sig  . aspirin EC 81 MG tablet Take 81 mg by mouth daily.  . hydrochlorothiazide (HYDRODIURIL) 25 MG tablet Take 1 tablet (25 mg total) by mouth daily. (Patient taking differently: Take 12.5 mg by mouth daily. )   No facility-administered encounter medications on file as of 11/19/2014.       Review of Systems  Constitutional: Positive for fatigue.  HENT: Negative.   Eyes: Negative.   Respiratory: Negative.   Cardiovascular:  Negative.        Chest soreness  Gastrointestinal: Negative.   Endocrine: Negative.   Genitourinary: Negative.   Musculoskeletal: Negative.   Skin: Negative.   Allergic/Immunologic: Negative.   Neurological: Negative.   Hematological: Negative.   Psychiatric/Behavioral: Negative.        Objective:   Physical Exam  Constitutional: She is oriented to person, place, and time. She appears well-developed and well-nourished. No distress.  HENT:  Head: Normocephalic and atraumatic.  Eyes: Conjunctivae and EOM are normal. Pupils are equal, round, and reactive to light. Right eye exhibits no discharge. Left eye exhibits no discharge. No scleral icterus.  Neck: Normal range of motion. Neck supple. No thyromegaly present.  Cardiovascular: Normal rate, regular rhythm and normal heart sounds.   No murmur heard. At 84/m  Pulmonary/Chest: Effort normal and breath sounds normal. No respiratory distress. She has no wheezes. She has no rales. She exhibits no tenderness.  Clear anteriorly and posteriorly  Abdominal: Soft. She exhibits no mass. There is no tenderness.  Musculoskeletal: Normal range of motion. She exhibits no edema.  Neurological: She is alert and oriented to person, place, and time.  Skin: Skin is warm and dry. No rash noted.  Psychiatric: She has a normal mood and affect. Her behavior is normal. Judgment and thought content normal.  Nursing note and vitals reviewed.  BP 154/110 mmHg  Pulse 96  Temp(Src) 97.1 F (36.2 C) (Oral)  Ht 5\' 10"  (1.778 m)  Wt 221 lb (100.245 kg)  BMI  31.71 kg/m2        Assessment & Plan:  1. Paroxysmal atrial fibrillation (HCC) -Start eliquis 5 mg twice daily -Patient to keep appointment with the cardiologist November 1  2. HYPERTENSION, BENIGN -Start Toprol XL 50 one daily  3. Fibromyalgia -Any Tylenol as needed for pain  4. Multiple drug allergies -Patient to come by next week for blood pressure and pulse rate checked  Meds ordered  this encounter  Medications  . apixaban (ELIQUIS) 5 MG TABS tablet    Sig: Take 1 tablet (5 mg total) by mouth 2 (two) times daily.    Dispense:  60 tablet    Refill:  3  . metoprolol succinate (TOPROL-XL) 50 MG 24 hr tablet    Sig: Take 1 tablet (50 mg total) by mouth daily. Take with or immediately following a meal.    Dispense:  30 tablet    Refill:  3   Patient Instructions  Follow-up with cardiology as planned After speaking to a cardiologist who recommended starting the patient on helical was 5 mg twice daily and Toprol XL 50 once daily The patient will come by next week to have her blood pressure checked and her heart rate checked   Arrie Senate MD

## 2014-11-19 NOTE — Patient Instructions (Signed)
Follow-up with cardiology as planned After speaking to a cardiologist who recommended starting the patient on helical was 5 mg twice daily and Toprol XL 50 once daily The patient will come by next week to have her blood pressure checked and her heart rate checked

## 2014-11-24 ENCOUNTER — Ambulatory Visit (INDEPENDENT_AMBULATORY_CARE_PROVIDER_SITE_OTHER): Payer: Commercial Managed Care - HMO | Admitting: *Deleted

## 2014-11-24 VITALS — BP 107/68 | HR 63

## 2014-11-24 DIAGNOSIS — I10 Essential (primary) hypertension: Secondary | ICD-10-CM

## 2014-11-24 NOTE — Progress Notes (Signed)
Pt here today for BP check. 

## 2014-11-29 NOTE — Addendum Note (Signed)
Addended by: Marylin Crosby on: 11/29/2014 04:35 PM   Modules accepted: Level of Service

## 2014-12-13 ENCOUNTER — Ambulatory Visit (INDEPENDENT_AMBULATORY_CARE_PROVIDER_SITE_OTHER): Payer: Commercial Managed Care - HMO | Admitting: Family Medicine

## 2014-12-13 ENCOUNTER — Encounter: Payer: Self-pay | Admitting: Family Medicine

## 2014-12-13 VITALS — BP 144/93 | HR 98 | Temp 97.9°F | Ht 70.0 in | Wt 221.4 lb

## 2014-12-13 DIAGNOSIS — K219 Gastro-esophageal reflux disease without esophagitis: Secondary | ICD-10-CM

## 2014-12-13 DIAGNOSIS — M797 Fibromyalgia: Secondary | ICD-10-CM

## 2014-12-13 DIAGNOSIS — I480 Paroxysmal atrial fibrillation: Secondary | ICD-10-CM | POA: Diagnosis not present

## 2014-12-13 MED ORDER — NORTRIPTYLINE HCL 25 MG PO CAPS: 25.0000 mg | ORAL_CAPSULE | Freq: Every day | ORAL | Status: DC

## 2014-12-13 MED ORDER — OMEPRAZOLE 40 MG PO CPDR
40.0000 mg | DELAYED_RELEASE_CAPSULE | Freq: Every day | ORAL | Status: DC
Start: 2014-12-13 — End: 2015-02-09

## 2014-12-13 NOTE — Progress Notes (Signed)
BP 144/93 mmHg  Pulse 98  Temp(Src) 97.9 F (36.6 C) (Oral)  Ht 5\' 10"  (1.778 m)  Wt 221 lb 6.4 oz (100.426 kg)  BMI 31.77 kg/m2   Subjective:    Patient ID: Alison Garrett, female    DOB: 1945/07/30, 69 y.o.   MRN: [id number]  HPI: Alison Garrett is a 69 y.o. female presenting on 12/13/2014 for Fatigue; Extremity Weakness; and Medication Problem   HPI A. Fib. Patient was in the hospital for A. fib with RVR and was started on metoprolol. And set up to go see cardiology for which she has an appointment in 2 days. She was in the hospital initially for the A. fib because she was feeling palpitations and flutters and lightheaded and dizzy. Since she has left the hospital been on metoprolol she feels that while she is taking metoprolol she just generally feels ill and malaise and no energy. She decided to stop the metoprolol on her own. She wants to discuss other meds for A. fib but currently today she is not having palpitations or symptoms from A. fib. She will return to discuss with cardiology.  Reflux Patient's been having epigastric abdominal pain associated with belching and burping sometimes radiates up into her chest. She takes occasional Tums or calcium carbonate and feels like they're just not cutting it. Her abdominal pain is described as a burning and is 5 out of 10. It does not radiate anywhere else but up into her chest occasionally.  Muscle aches and pains Patient has chronic muscle aches and pains in her muscles of her legs and arms and back. She has been diagnosed with fibromyalgia previously and has tried a couple of medication such as gabapentin for this but did not like the side effects. Muscle aches, no and she does not necessarily notice anything specifically that triggers them. She does admit to having anxiety and some depression but denies suicidal ideations  Relevant past medical, surgical, family and social history reviewed and updated as indicated. Interim medical  history since our last visit reviewed. Allergies and medications reviewed and updated.  Review of Systems  Constitutional: Negative for fever and chills.  HENT: Negative for congestion, ear discharge and ear pain.   Eyes: Negative for redness and visual disturbance.  Respiratory: Negative for chest tightness and shortness of breath.   Cardiovascular: Positive for palpitations. Negative for chest pain and leg swelling.  Gastrointestinal: Positive for nausea and abdominal pain (heartburn).  Genitourinary: Negative for dysuria and difficulty urinating.  Musculoskeletal: Positive for myalgias. Negative for back pain and gait problem.  Skin: Negative for rash.  Neurological: Positive for dizziness and light-headedness. Negative for headaches.  Psychiatric/Behavioral: Positive for sleep disturbance (she does not sleep well at night, often because her mind is racing ) and dysphoric mood. Negative for suicidal ideas, behavioral problems, self-injury and agitation. The patient is nervous/anxious.   All other systems reviewed and are negative.   Per HPI unless specifically indicated above     Medication List       This list is accurate as of: 12/13/14  1:17 PM.  Always use your most recent med list.               apixaban 5 MG Tabs tablet  Commonly known as:  ELIQUIS  Take 1 tablet (5 mg total) by mouth 2 (two) times daily.     aspirin EC 81 MG tablet  Take 81 mg by mouth daily.     hydrochlorothiazide  25 MG tablet  Commonly known as:  HYDRODIURIL  TAKE ONE TABLET BY MOUTH ONCE DAILY     nortriptyline 25 MG capsule  Commonly known as:  PAMELOR  Take 1 capsule (25 mg total) by mouth at bedtime.     omeprazole 40 MG capsule  Commonly known as:  PRILOSEC  Take 1 capsule (40 mg total) by mouth daily. Take 30 minutes prior to breakfast           Objective:    BP 144/93 mmHg  Pulse 98  Temp(Src) 97.9 F (36.6 C) (Oral)  Ht 5\' 10"  (1.778 m)  Wt 221 lb 6.4 oz (100.426 kg)   BMI 31.77 kg/m2  Wt Readings from Last 3 Encounters:  12/13/14 221 lb 6.4 oz (100.426 kg)  11/19/14 221 lb (100.245 kg)  11/17/14 222 lb 1.6 oz (100.744 kg)    Physical Exam  Constitutional: She is oriented to person, place, and time. She appears well-developed and well-nourished. No distress.  Eyes: Conjunctivae and EOM are normal. Pupils are equal, round, and reactive to light.  Cardiovascular: Normal rate, regular rhythm, normal heart sounds and intact distal pulses.   No murmur heard. Pulmonary/Chest: Effort normal and breath sounds normal. No respiratory distress. She has no wheezes.  Abdominal: Soft. Bowel sounds are normal. She exhibits no distension. There is tenderness ( mild epigastric tenderness). There is no rebound and no guarding.  Musculoskeletal: Normal range of motion. She exhibits tenderness (Trigger point tenderness including 7 points positive). She exhibits no edema.  Neurological: She is alert and oriented to person, place, and time. Coordination normal.  Skin: Skin is warm and dry. No rash noted. She is not diaphoretic.  Psychiatric: She has a normal mood and affect. Her behavior is normal.  Vitals reviewed.   Results for orders placed or performed during the hospital encounter of 11/16/14  CBC with Differential/Platelet  Result Value Ref Range   WBC 8.8 4.0 - 10.5 K/uL   RBC 4.94 3.87 - 5.11 MIL/uL   Hemoglobin 14.6 12.0 - 15.0 g/dL   HCT 42.5 36.0 - 46.0 %   MCV 86.0 78.0 - 100.0 fL   MCH 29.6 26.0 - 34.0 pg   MCHC 34.4 30.0 - 36.0 g/dL   RDW 12.9 11.5 - 15.5 %   Platelets 233 150 - 400 K/uL   Neutrophils Relative % 72 %   Neutro Abs 6.4 1.7 - 7.7 K/uL   Lymphocytes Relative 19 %   Lymphs Abs 1.6 0.7 - 4.0 K/uL   Monocytes Relative 8 %   Monocytes Absolute 0.7 0.1 - 1.0 K/uL   Eosinophils Relative 1 %   Eosinophils Absolute 0.0 0.0 - 0.7 K/uL   Basophils Relative 0 %   Basophils Absolute 0.0 0.0 - 0.1 K/uL  Urinalysis, Routine w reflex microscopic  (not at [facility])  Result Value Ref Range   Color, Urine STRAW (A) YELLOW   APPearance CLEAR CLEAR   Specific Gravity, Urine 1.008 1.005 - 1.030   pH 6.5 5.0 - 8.0   Glucose, UA NEGATIVE NEGATIVE mg/dL   Hgb urine dipstick NEGATIVE NEGATIVE   Bilirubin Urine NEGATIVE NEGATIVE   Ketones, ur 15 (A) NEGATIVE mg/dL   Protein, ur NEGATIVE NEGATIVE mg/dL   Urobilinogen, UA 0.2 0.0 - 1.0 mg/dL   Nitrite NEGATIVE NEGATIVE   Leukocytes, UA TRACE (A) NEGATIVE  Basic metabolic panel  Result Value Ref Range   Sodium 136 135 - 145 mmol/L   Potassium 3.4 (L) 3.5 - 5.1  mmol/L   Chloride 101 101 - 111 mmol/L   CO2 24 22 - 32 mmol/L   Glucose, Bld 139 (H) 65 - 99 mg/dL   BUN 19 6 - 20 mg/dL   Creatinine, Ser 1.11 (H) 0.44 - 1.00 mg/dL   Calcium 8.8 (L) 8.9 - 10.3 mg/dL   GFR calc non Af Amer 50 (L) >60 mL/min   GFR calc Af Amer 58 (L) >60 mL/min   Anion gap 11 5 - 15  TSH  Result Value Ref Range   TSH 0.599 0.350 - 4.500 uIU/mL  Magnesium  Result Value Ref Range   Magnesium 1.8 1.7 - 2.4 mg/dL  Heparin level (unfractionated)  Result Value Ref Range   Heparin Unfractionated 0.38 0.30 - 0.70 IU/mL  Troponin I  Result Value Ref Range   Troponin I <0.03 <0.031 ng/mL  Urine microscopic-add on  Result Value Ref Range   Squamous Epithelial / LPF FEW (A) RARE   WBC, UA 3-6 <3 WBC/hpf   RBC / HPF 0-2 <3 RBC/hpf   Bacteria, UA FEW (A) RARE  Troponin I-serum (0, 3, 6 hours)  Result Value Ref Range   Troponin I <0.03 <0.031 ng/mL  Troponin I-serum (0, 3, 6 hours)  Result Value Ref Range   Troponin I <0.03 <0.031 ng/mL  Troponin I-serum (0, 3, 6 hours)  Result Value Ref Range   Troponin I 0.03 <0.031 ng/mL  CBC  Result Value Ref Range   WBC 7.5 4.0 - 10.5 K/uL   RBC 4.66 3.87 - 5.11 MIL/uL   Hemoglobin 13.4 12.0 - 15.0 g/dL   HCT 39.9 36.0 - 46.0 %   MCV 85.6 78.0 - 100.0 fL   MCH 28.8 26.0 - 34.0 pg   MCHC 33.6 30.0 - 36.0 g/dL   RDW 13.0 11.5 - 15.5 %   Platelets 213 150 - 400 K/uL    I-Stat Troponin, ED (not at [facility])  Result Value Ref Range   Troponin i, poc 0.00 0.00 - 0.08 ng/mL   Comment 3           EKG: EKG shows normal sinus rhythm today, no signs of A. fib    Assessment & Plan:   Problem List Items Addressed This Visit      Cardiovascular and Mediastinum   Atrial fibrillation ([place]) - Primary    Is out of A. fib today, she stopped metoprolol because of side effects from it. She has an appointment with cardiology in 2 days and await that visit for changes      Relevant Orders   EKG 12-Lead (Completed)    Other Visit Diagnoses    Gastroesophageal reflux disease without esophagitis        We'll try omeprazole and see if helps.    Relevant Medications    omeprazole (PRILOSEC) 40 MG capsule    Fibromyalgia syndrome        She tried medications before but has never tried nortriptyline. We'll try it.    Relevant Medications    nortriptyline (PAMELOR) 25 MG capsule        Follow up plan: Return in about 4 weeks (around 01/10/2015), or if symptoms worsen or fail to improve, for Follow-up fibromyalgia.  Caryl Pina, MD [facility] 12/13/2014, 1:17 PM

## 2014-12-13 NOTE — Assessment & Plan Note (Signed)
Is out of A. fib today, she stopped metoprolol because of side effects from it. She has an appointment with cardiology in 2 days and await that visit for changes

## 2014-12-15 ENCOUNTER — Encounter: Payer: Self-pay | Admitting: Cardiology

## 2014-12-15 ENCOUNTER — Ambulatory Visit (INDEPENDENT_AMBULATORY_CARE_PROVIDER_SITE_OTHER): Payer: Commercial Managed Care - HMO | Admitting: Cardiology

## 2014-12-15 VITALS — BP 126/82 | HR 96 | Ht 70.0 in | Wt 220.0 lb

## 2014-12-15 DIAGNOSIS — I10 Essential (primary) hypertension: Secondary | ICD-10-CM | POA: Diagnosis not present

## 2014-12-15 DIAGNOSIS — I4891 Unspecified atrial fibrillation: Secondary | ICD-10-CM | POA: Diagnosis not present

## 2014-12-15 MED ORDER — DILTIAZEM HCL 30 MG PO TABS: 30.0000 mg | ORAL_TABLET | ORAL | Status: DC | PRN

## 2014-12-15 NOTE — Patient Instructions (Addendum)
Medication Instructions:  You may take Cardizem 30 mg as needed to slow your heart rate. The current medical regimen is effective;  continue present plan and medications.  Follow-Up: Follow up as needed.  Thank you for choosing [facility]!!

## 2014-12-15 NOTE — Progress Notes (Signed)
HPI The patient presents for evaluation of atrial fibrillation.  She was in the hospital in October. He had atrial fibrillation at home. She was transported to the emergency room. I did review all of these records. She has a variant conduction that was somewhat wide complex and symmetric depression on her EKG. However, she wasn't having any chest pain. She felt the palpitations. She was cardioverted successfully to sinus rhythm. She was observed overnight. Cardiac enzymes were negative and her EKG returned to baseline. She since has done well in this is only the third episode. She's had no further tachypalpitations. She is active during a full-time job. She denies any cardiovascular symptoms such as chest discomfort, neck or arm discomfort. She's had no new shortness of breath, PND or orthopnea. She's had no weight gain or edema.   Allergies  Allergen Reactions  . Contrast Media [Iodinated Diagnostic Agents] Anaphylaxis  . Penicillins Anaphylaxis  . Benicar [Olmesartan]     rash  . Sulfonamide Derivatives   . Celebrex [Celecoxib] Anxiety  . Cymbalta [Duloxetine Hcl] Itching  . Neurontin [Gabapentin] Nausea Only  . Norvasc [Amlodipine Besylate] Swelling  . Prozac [Fluoxetine Hcl] Other (See Comments)  . Xanax [Alprazolam] Other (See Comments)  . Zoloft [Sertraline Hcl] Itching  . Zyprexa [Olanzapine] Other (See Comments)    Wt gain    Current Outpatient Prescriptions  Medication Sig Dispense Refill  . apixaban (ELIQUIS) 5 MG TABS tablet Take 1 tablet (5 mg total) by mouth 2 (two) times daily. 60 tablet 3  . aspirin EC 81 MG tablet Take 81 mg by mouth daily.    . hydrochlorothiazide (HYDRODIURIL) 12.5 MG tablet Take 12.5 mg by mouth daily.    Marland Kitchen omeprazole (PRILOSEC) 40 MG capsule Take 1 capsule (40 mg total) by mouth daily. Take 30 minutes prior to breakfast 30 capsule 3  . nortriptyline (PAMELOR) 25 MG capsule Take 1 capsule (25 mg total) by mouth at bedtime. (Patient not taking:  Reported on 12/15/2014) 30 capsule 1   No current facility-administered medications for this visit.    Past Medical History  Diagnosis Date  . Fibromyalgia     Not currently under active treatment  . GERD (gastroesophageal reflux disease)   . Hiatal hernia   . Hypertension   . Dyslipidemia   . Obesity   . Essential tremor 01/12/2014  . Afib [facility])     Past Surgical History  Procedure Laterality Date  . Abdominal hysterectomy      ROS:  As stated in the HPI and negative for all other systems.  PHYSICAL EXAM BP 126/82 mmHg  Pulse 96  Ht 5\' 10"  (1.778 m)  Wt 220 lb (99.791 kg)  BMI 31.57 kg/m2 GENERAL:  Well appearing NECK:  No jugular venous distention, waveform within normal limits, carotid upstroke brisk and symmetric, no bruits, no thyromegaly LUNGS:  Clear to auscultation bilaterally BACK:  No CVA tenderness CHEST:  Unremarkable HEART:  PMI not displaced or sustained,S1 and S2 within normal limits, no S3, no S4, no clicks, no rubs, no murmurs ABD:  Flat, positive bowel sounds normal in frequency in pitch, no bruits, no rebound, no guarding, no midline pulsatile mass, no hepatomegaly, no splenomegaly EXT:  2 plus pulses throughout, no edema, no cyanosis no clubbing    ASSESSMENT AND PLAN  ATRIAL FIB:  The patient would be a reasonable candidate for a propafenone pill in pocket approach if she has this again the first dose administered in the emergency room. We discussed  this strategy. She could at home take the by mouth Cardizem.  Ms. FATUMA DOWERS has a CHA2DS2 - VASc score of 3 with a risk of stroke of 3.2%.  She will remain on Eliquis.  I gave her instructions to stop her ASA.    HTN:  Her blood pressure is at target.    OVERWEIGHT:   We talked about her resuming her walking.

## 2015-01-05 DIAGNOSIS — H5210 Myopia, unspecified eye: Secondary | ICD-10-CM | POA: Diagnosis not present

## 2015-01-05 DIAGNOSIS — H5200 Hypermetropia, unspecified eye: Secondary | ICD-10-CM | POA: Diagnosis not present

## 2015-01-05 DIAGNOSIS — H2510 Age-related nuclear cataract, unspecified eye: Secondary | ICD-10-CM | POA: Diagnosis not present

## 2015-01-05 DIAGNOSIS — I10 Essential (primary) hypertension: Secondary | ICD-10-CM | POA: Diagnosis not present

## 2015-01-10 ENCOUNTER — Ambulatory Visit: Payer: Commercial Managed Care - HMO | Admitting: Family Medicine

## 2015-01-12 ENCOUNTER — Ambulatory Visit (INDEPENDENT_AMBULATORY_CARE_PROVIDER_SITE_OTHER): Payer: Commercial Managed Care - HMO | Admitting: Family Medicine

## 2015-01-12 ENCOUNTER — Encounter: Payer: Self-pay | Admitting: Family Medicine

## 2015-01-12 VITALS — BP 130/81 | HR 88 | Temp 97.8°F | Ht 70.0 in | Wt 223.8 lb

## 2015-01-12 DIAGNOSIS — Z1211 Encounter for screening for malignant neoplasm of colon: Secondary | ICD-10-CM | POA: Diagnosis not present

## 2015-01-12 DIAGNOSIS — M797 Fibromyalgia: Secondary | ICD-10-CM

## 2015-01-12 MED ORDER — NORTRIPTYLINE HCL 10 MG PO CAPS: 10.0000 mg | ORAL_CAPSULE | Freq: Every day | ORAL | Status: DC

## 2015-01-12 NOTE — Progress Notes (Signed)
BP 130/81 mmHg  Pulse 88  Temp(Src) 97.8 F (36.6 C) (Oral)  Ht 5\' 10"  (1.778 m)  Wt 223 lb 12.8 oz (101.515 kg)  BMI 32.11 kg/m2   Subjective:    Patient ID: Alison Garrett, female    DOB: 22-Jul-1945, 69 y.o.   MRN: [id number]  HPI: Alison Garrett is a 69 y.o. female presenting on 01/12/2015 for Fibromyalgia followup   HPI Fibromyalgia and insomnia recheck Patient presents today for a recheck on her fibromyalgia and insomnia for which we had started nortriptyline 25 mg at bedtime. She says that she took the nortriptyline 25 mg for a few days but always awoke so groggy that she wasn't able to get up and do things in the morning. She then stopped it. She does feel like she got a good sleep on the nights when she was taken at but did not take it long enough to notice if it made a difference with her pain. Her pain is about where it usually is as a 5 out of 10 and diffuse myalgias.  Relevant past medical, surgical, family and social history reviewed and updated as indicated. Interim medical history since our last visit reviewed. Allergies and medications reviewed and updated.  Review of Systems  Constitutional: Negative for fever and chills.  HENT: Negative for congestion, ear discharge and ear pain.   Eyes: Negative for redness and visual disturbance.  Respiratory: Negative for chest tightness and shortness of breath.   Cardiovascular: Negative for chest pain and leg swelling.  Genitourinary: Negative for dysuria and difficulty urinating.  Musculoskeletal: Positive for myalgias. Negative for back pain and gait problem.  Skin: Negative for rash.  Neurological: Negative for light-headedness and headaches.  Psychiatric/Behavioral: Positive for sleep disturbance. Negative for behavioral problems and agitation.  All other systems reviewed and are negative.   Per HPI unless specifically indicated above     Medication List       This list is accurate as of: 01/12/15  2:10 PM.   Always use your most recent med list.               apixaban 5 MG Tabs tablet  Commonly known as:  ELIQUIS  Take 1 tablet (5 mg total) by mouth 2 (two) times daily.     hydrochlorothiazide 12.5 MG tablet  Commonly known as:  HYDRODIURIL  Take 25 mg by mouth daily. Take 1/2 daily     nortriptyline 10 MG capsule  Commonly known as:  PAMELOR  Take 1 capsule (10 mg total) by mouth at bedtime.     omeprazole 40 MG capsule  Commonly known as:  PRILOSEC  Take 1 capsule (40 mg total) by mouth daily. Take 30 minutes prior to breakfast           Objective:    BP 130/81 mmHg  Pulse 88  Temp(Src) 97.8 F (36.6 C) (Oral)  Ht 5\' 10"  (1.778 m)  Wt 223 lb 12.8 oz (101.515 kg)  BMI 32.11 kg/m2  Wt Readings from Last 3 Encounters:  01/12/15 223 lb 12.8 oz (101.515 kg)  12/15/14 220 lb (99.791 kg)  12/13/14 221 lb 6.4 oz (100.426 kg)    Physical Exam  Constitutional: She is oriented to person, place, and time. She appears well-developed and well-nourished. No distress.  Eyes: Conjunctivae and EOM are normal. Pupils are equal, round, and reactive to light.  Cardiovascular: Normal rate, regular rhythm, normal heart sounds and intact distal pulses.   No murmur heard.  Pulmonary/Chest: Effort normal and breath sounds normal. No respiratory distress. She has no wheezes.  Abdominal: Soft. Bowel sounds are normal. She exhibits no distension. There is no tenderness. There is no rebound and no guarding.  Musculoskeletal: Normal range of motion. She exhibits no edema or tenderness.  At least 5 trigger points positive for myalgia.  Neurological: She is alert and oriented to person, place, and time. Coordination normal.  Skin: Skin is warm and dry. No rash noted. She is not diaphoretic.  Psychiatric: She has a normal mood and affect. Her behavior is normal.  Nursing note and vitals reviewed.       Assessment & Plan:   Problem List Items Addressed This Visit      Musculoskeletal and  Integument   Fibromyalgia    She felt like nortriptyline 25 mg was too strong and would like to try a lower dose.      Relevant Medications   nortriptyline (PAMELOR) 10 MG capsule    Other Visit Diagnoses    Colon cancer screening    -  Primary    Relevant Orders    Ambulatory referral to Gastroenterology        Follow up plan: Return in about 3 months (around 04/12/2015), or if symptoms worsen or fail to improve, for fibromyalgia.  Counseling provided for all of the vaccine components Orders Placed This Encounter  Procedures  . Ambulatory referral to Gastroenterology    Caryl Pina, MD [facility] 01/12/2015, 2:10 PM

## 2015-01-12 NOTE — Assessment & Plan Note (Signed)
She felt like nortriptyline 25 mg was too strong and would like to try a lower dose.

## 2015-02-02 DIAGNOSIS — Z1231 Encounter for screening mammogram for malignant neoplasm of breast: Secondary | ICD-10-CM | POA: Diagnosis not present

## 2015-02-02 LAB — HM MAMMOGRAPHY: HM Mammogram: NEGATIVE

## 2015-02-09 ENCOUNTER — Ambulatory Visit (INDEPENDENT_AMBULATORY_CARE_PROVIDER_SITE_OTHER): Payer: Commercial Managed Care - HMO | Admitting: Family Medicine

## 2015-02-09 ENCOUNTER — Encounter (INDEPENDENT_AMBULATORY_CARE_PROVIDER_SITE_OTHER): Payer: Self-pay

## 2015-02-09 ENCOUNTER — Encounter: Payer: Self-pay | Admitting: Family Medicine

## 2015-02-09 VITALS — BP 143/99 | HR 86 | Temp 97.4°F | Ht 70.0 in | Wt 222.0 lb

## 2015-02-09 DIAGNOSIS — E785 Hyperlipidemia, unspecified: Secondary | ICD-10-CM | POA: Diagnosis not present

## 2015-02-09 DIAGNOSIS — K219 Gastro-esophageal reflux disease without esophagitis: Secondary | ICD-10-CM | POA: Diagnosis not present

## 2015-02-09 DIAGNOSIS — E559 Vitamin D deficiency, unspecified: Secondary | ICD-10-CM | POA: Diagnosis not present

## 2015-02-09 DIAGNOSIS — I10 Essential (primary) hypertension: Secondary | ICD-10-CM | POA: Diagnosis not present

## 2015-02-09 DIAGNOSIS — M797 Fibromyalgia: Secondary | ICD-10-CM

## 2015-02-09 DIAGNOSIS — I480 Paroxysmal atrial fibrillation: Secondary | ICD-10-CM | POA: Diagnosis not present

## 2015-02-09 DIAGNOSIS — R7989 Other specified abnormal findings of blood chemistry: Secondary | ICD-10-CM | POA: Diagnosis not present

## 2015-02-09 NOTE — Progress Notes (Signed)
Subjective:    Patient ID: Alison Garrett, female    DOB: [birth year], 69 y.o.   MRN: [id number]  HPI Pt here for follow up and management of chronic medical problems which includes hyperlipidemia and hypertension. She is taking medications regularly. This patient has a history of atrial fibrillation with rapid ventricular rate. She also has a history of multiple drug allergies. She is taking medication for her blood pressure. The patient denies chest pain or shortness of breath. She has not had any further bouts of atrial fibrillation and she is aware when these occur. She is off the blood thinner and back on her baby aspirin. She was not clear if the cardiologist said a baby aspirin or an adult aspirin and we will check with him on this. The patient is statin intolerant and she was offered the possibility of doing a PCS K-9 inhibitor and she refuses this. She is also due to get her pelvic exam and she will be reminded to schedule an appointment to get this done. Her blood pressure today was elevated and she says that her blood pressures at home and been running in the 1:30 to 135 range over the 80-85 range. She will return to the office and have this rechecked in a couple weeks by the nurse. She denies any problems with swallowing heartburn indigestion and nausea vomiting diarrhea or blood in the stool. She is passing her water without problems. She refuses to take the flu shot or the Prevnar vaccine. She will be given an FOBT to return and she will be called with her lab work as soon as it becomes available.      Patient Active Problem List   Diagnosis Date Noted  . Chronic kidney disease, stage 3 11/17/2014  . Atrial fibrillation with RVR ([place]) 11/16/2014  . Multiple drug allergies 08/11/2014  . Fatigue 01/12/2014  . Essential tremor 01/12/2014  . Metabolic syndrome 16/57/9038  . Hyperlipemia 09/17/2012  . Vitamin D deficiency 09/17/2012  . Fibromyalgia 09/17/2012  . Atrial fibrillation  ([place]) 07/11/2011  . OBESITY, UNSPECIFIED 12/01/2008  . HYPERTENSION, BENIGN 12/01/2008  . ABNORMAL ELECTROCARDIOGRAM 12/01/2008   Outpatient Encounter Prescriptions as of 02/09/2015  Medication Sig  . aspirin 81 MG tablet Take 81 mg by mouth daily.  . hydrochlorothiazide (HYDRODIURIL) 12.5 MG tablet Take 25 mg by mouth daily. Take 1/2 daily  . [DISCONTINUED] apixaban (ELIQUIS) 5 MG TABS tablet Take 1 tablet (5 mg total) by mouth 2 (two) times daily.  . [DISCONTINUED] nortriptyline (PAMELOR) 10 MG capsule Take 1 capsule (10 mg total) by mouth at bedtime.  . [DISCONTINUED] omeprazole (PRILOSEC) 40 MG capsule Take 1 capsule (40 mg total) by mouth daily. Take 30 minutes prior to breakfast   No facility-administered encounter medications on file as of 02/09/2015.      Review of Systems  Constitutional: Negative.   HENT: Negative.   Eyes: Negative.   Respiratory: Negative.   Cardiovascular: Negative.   Gastrointestinal: Negative.   Endocrine: Negative.   Genitourinary: Negative.   Musculoskeletal: Negative.   Skin: Negative.   Allergic/Immunologic: Negative.   Neurological: Negative.   Hematological: Negative.   Psychiatric/Behavioral: Negative.        Objective:   Physical Exam  Constitutional: She is oriented to person, place, and time. She appears well-developed and well-nourished. No distress.  HENT:  Head: Normocephalic and atraumatic.  Right Ear: External ear normal.  Left Ear: External ear normal.  Mouth/Throat: Oropharynx is clear and moist. No oropharyngeal exudate.  Slight nasal congestion bilaterally  and prominent tonsils  Eyes: Conjunctivae and EOM are normal. Pupils are equal, round, and reactive to light. Right eye exhibits no discharge. Left eye exhibits no discharge. No scleral icterus.  The patient's eye exam was done recently and there were no problems according to the patient. She'll make sure that we get copies of this report.  Neck: Normal range of  motion. Neck supple. No thyromegaly present.  No bruits or thyromegaly  Cardiovascular: Normal rate, regular rhythm, normal heart sounds and intact distal pulses.   No murmur heard. The heart had a regular rate and rhythm at 84/m  Pulmonary/Chest: Effort normal and breath sounds normal. No respiratory distress. She has no wheezes. She has no rales. She exhibits no tenderness.  Clear anteriorly and posteriorly  Abdominal: Soft. Bowel sounds are normal. She exhibits no mass. There is no tenderness. There is no rebound and no guarding.  There were no abdominal bruits. There was slight epigastric tenderness with no enlargement of the liver or spleen  Musculoskeletal: Normal range of motion. She exhibits no edema.  Lymphadenopathy:    She has no cervical adenopathy.  Neurological: She is alert and oriented to person, place, and time. She has normal reflexes. No cranial nerve deficit.  Skin: Skin is warm and dry. No rash noted.  Psychiatric: She has a normal mood and affect. Her behavior is normal. Judgment and thought content normal.  Nursing note and vitals reviewed.  BP 143/99 mmHg  Pulse 86  Temp(Src) 97.4 F (36.3 C) (Oral)  Ht 5' 10" (1.778 m)  Wt 222 lb (100.699 kg)  BMI 31.85 kg/m2        Assessment & Plan:  1. Fibromyalgia -The patient is doing well with this currently and has no specific complaints. She is remaining active and enjoys being active and working at the [employer] shot. - CBC with Differential/Platelet  2. Paroxysmal atrial fibrillation ([place]) -The patient saw the cardiologist recently after her bout with atrial fibrillation and she says that she will be seeing him yearly. - CBC with Differential/Platelet  3. Gastroesophageal reflux disease without esophagitis -She has no complaints with this today. - CBC with Differential/Platelet  4. Vitamin D deficiency -She should continue with her current vitamin D treatment pending results of lab work - CBC with  Differential/Platelet  5. HYPERTENSION, BENIGN -The blood pressure is elevated today and she says that her home readings run in the 1:30 to 135 range over the 80-85 range and she will return to the office in 2 weeks at a nurse recheck her blood pressure. - BMP8+EGFR - CBC with Differential/Platelet - Hepatic function panel  6. Hyperlipemia -Continue aggressive therapeutic lifestyle changes including diet and exercise - CBC with Differential/Platelet - NMR, lipoprofile  7. Low serum vitamin D -The patient has had low vitamin D in the past and we will see what it is at this point in time and recommend further treatment is necessary - CBC with Differential/Platelet - VITAMIN D 25 Hydroxy (Vit-D Deficiency, Fractures)  Meds ordered this encounter  Medications  . aspirin 81 MG tablet    Sig: Take 81 mg by mouth daily.   Patient Instructions                       Medicare Annual Wellness Visit  [facility] and the medical providers at [facility] strive to bring you the best medical care.  In doing so we not only  want to address your current medical conditions and concerns but also to detect new conditions early and prevent illness, disease and health-related problems.    Medicare offers a yearly Wellness Visit which allows our clinical staff to assess your need for preventative services including immunizations, lifestyle education, counseling to decrease risk of preventable diseases and screening for fall risk and other medical concerns.    This visit is provided free of charge (no copay) for all Medicare recipients. The clinical pharmacists at [facility] have begun to conduct these Wellness Visits which will also include a thorough review of all your medications.    As you primary medical provider recommend that you make an appointment for your Annual Wellness Visit if you have not done so already this year.  You may set up this appointment  before you leave today or you may call back [phone]) and schedule an appointment.  Please make sure when you call that you mention that you are scheduling your Annual Wellness Visit with the clinical pharmacist so that the appointment may be made for the proper length of time.     Continue current medications. Continue good therapeutic lifestyle changes which include good diet and exercise. Fall precautions discussed with patient. If an FOBT was given today- please return it to our front desk. If you are over 6 years old - you may need Prevnar 53 or the adult Pneumonia vaccine.  **Flu shots are available--- please call and schedule a FLU-CLINIC appointment**  After your visit with [place] today you will receive a survey in the mail or online from [employer] regarding your care with [place]. Please take a moment to fill this out. Your feedback is very important to [place] as you can help [place] better understand your patient needs as well as improve your experience and satisfaction. WE CARE ABOUT YOU!!!   The patient should return in a couple of weeks with home blood pressure readings and have her blood pressure rechecked by the nurse We will check with her cardiologist regarding the strength of aspirin that she should be taking since she has come off of the blood thinner following this bout of atrial fibrillation----we will let her know of his recommendations For the time being continue baby aspirin Continue to monitor blood pressures at home and watch sodium intake The patient should schedule herself for a pelvic exam and this can be done by a provider here She will call her radiology group and make sure that we get a copy of her mammogram report   Arrie Senate MD

## 2015-02-09 NOTE — Patient Instructions (Addendum)
Medicare Annual Wellness Visit  [facility] and the medical providers at [facility] strive to bring you the best medical care.  In doing so we not only want to address your current medical conditions and concerns but also to detect new conditions early and prevent illness, disease and health-related problems.    Medicare offers a yearly Wellness Visit which allows our clinical staff to assess your need for preventative services including immunizations, lifestyle education, counseling to decrease risk of preventable diseases and screening for fall risk and other medical concerns.    This visit is provided free of charge (no copay) for all Medicare recipients. The clinical pharmacists at [facility] have begun to conduct these Wellness Visits which will also include a thorough review of all your medications.    As you primary medical provider recommend that you make an appointment for your Annual Wellness Visit if you have not done so already this year.  You may set up this appointment before you leave today or you may call back [phone] and schedule an appointment.  Please make sure when you call that you mention that you are scheduling your Annual Wellness Visit with the clinical pharmacist so that the appointment may be made for the proper length of time.     Continue current medications. Continue good therapeutic lifestyle changes which include good diet and exercise. Fall precautions discussed with patient. If an FOBT was given today- please return it to our front desk. If you are over 55 years old - you may need Prevnar 110 or the adult Pneumonia vaccine.  **Flu shots are available--- please call and schedule a FLU-CLINIC appointment**  After your visit with [place] today you will receive a survey in the mail or online from [employer] regarding your care with [place]. Please take a moment to fill this out. Your feedback is very  important to [place] as you can help [place] better understand your patient needs as well as improve your experience and satisfaction. WE CARE ABOUT YOU!!!   The patient should return in a couple of weeks with home blood pressure readings and have her blood pressure rechecked by the nurse We will check with her cardiologist regarding the strength of aspirin that she should be taking since she has come off of the blood thinner following this bout of atrial fibrillation----we will let her know of his recommendations For the time being continue baby aspirin Continue to monitor blood pressures at home and watch sodium intake The patient should schedule herself for a pelvic exam and this can be done by a provider here She will call her radiology group and make sure that we get a copy of her mammogram report

## 2015-02-10 ENCOUNTER — Encounter: Payer: Self-pay | Admitting: *Deleted

## 2015-02-10 LAB — NMR, LIPOPROFILE
Cholesterol: 177 mg/dL (ref 100–199)
HDL CHOLESTEROL BY NMR: 38 mg/dL — AB (ref >39)
HDL Particle Number: 26.6 umol/L — ABNORMAL LOW (ref >=30.5)
LDL PARTICLE NUMBER: 1609 nmol/L — AB (ref <1000)
LDL Size: 20.6 nm (ref >20.5)
LDL-C: 112 mg/dL — AB (ref 0–99)
LP-IR SCORE: 65 — AB (ref <=45)
Small LDL Particle Number: 894 nmol/L — ABNORMAL HIGH (ref <=527)
Triglycerides by NMR: 135 mg/dL (ref 0–149)

## 2015-02-10 LAB — CBC WITH DIFFERENTIAL/PLATELET
BASOS: 1 %
Basophils Absolute: 0 10*3/uL (ref 0.0–0.2)
EOS (ABSOLUTE): 0.1 10*3/uL (ref 0.0–0.4)
EOS: 2 %
HEMATOCRIT: 42.7 % (ref 34.0–46.6)
HEMOGLOBIN: 14.5 g/dL (ref 11.1–15.9)
IMMATURE GRANS (ABS): 0 10*3/uL (ref 0.0–0.1)
IMMATURE GRANULOCYTES: 0 %
LYMPHS: 31 %
Lymphocytes Absolute: 1.7 10*3/uL (ref 0.7–3.1)
MCH: 29.1 pg (ref 26.6–33.0)
MCHC: 34 g/dL (ref 31.5–35.7)
MCV: 86 fL (ref 79–97)
MONOCYTES: 10 %
Monocytes Absolute: 0.6 10*3/uL (ref 0.1–0.9)
NEUTROS PCT: 56 %
Neutrophils Absolute: 3.2 10*3/uL (ref 1.4–7.0)
Platelets: 248 10*3/uL (ref 150–379)
RBC: 4.99 x10E6/uL (ref 3.77–5.28)
RDW: 13.1 % (ref 12.3–15.4)
WBC: 5.6 10*3/uL (ref 3.4–10.8)

## 2015-02-10 LAB — BMP8+EGFR
BUN/Creatinine Ratio: 15 (ref 11–26)
BUN: 16 mg/dL (ref 8–27)
CALCIUM: 9.3 mg/dL (ref 8.7–10.3)
CO2: 25 mmol/L (ref 18–29)
CREATININE: 1.07 mg/dL — AB (ref 0.57–1.00)
Chloride: 103 mmol/L (ref 96–106)
GFR calc Af Amer: 62 mL/min/{1.73_m2} (ref >59)
GFR, EST NON AFRICAN AMERICAN: 53 mL/min/{1.73_m2} — AB (ref >59)
Glucose: 99 mg/dL (ref 65–99)
Potassium: 4.6 mmol/L (ref 3.5–5.2)
Sodium: 141 mmol/L (ref 134–144)

## 2015-02-10 LAB — HEPATIC FUNCTION PANEL
ALBUMIN: 3.8 g/dL (ref 3.6–4.8)
ALK PHOS: 101 IU/L (ref 39–117)
ALT: 35 IU/L — ABNORMAL HIGH (ref 0–32)
AST: 30 IU/L (ref 0–40)
Bilirubin Total: 0.7 mg/dL (ref 0.0–1.2)
Bilirubin, Direct: 0.14 mg/dL (ref 0.00–0.40)
TOTAL PROTEIN: 6.6 g/dL (ref 6.0–8.5)

## 2015-02-10 LAB — VITAMIN D 25 HYDROXY (VIT D DEFICIENCY, FRACTURES): VIT D 25 HYDROXY: 23.8 ng/mL — AB (ref 30.0–100.0)

## 2015-02-10 NOTE — Progress Notes (Signed)
Patient aware.

## 2015-04-13 ENCOUNTER — Ambulatory Visit: Payer: Commercial Managed Care - HMO | Admitting: Family Medicine

## 2015-04-20 ENCOUNTER — Ambulatory Visit (INDEPENDENT_AMBULATORY_CARE_PROVIDER_SITE_OTHER): Payer: Commercial Managed Care - HMO | Admitting: Family Medicine

## 2015-04-20 ENCOUNTER — Encounter: Payer: Self-pay | Admitting: Family Medicine

## 2015-04-20 VITALS — BP 128/85 | HR 81 | Temp 98.0°F | Ht 70.0 in | Wt 225.4 lb

## 2015-04-20 DIAGNOSIS — M797 Fibromyalgia: Secondary | ICD-10-CM

## 2015-04-20 MED ORDER — HYDROCHLOROTHIAZIDE 12.5 MG PO TABS: 25.0000 mg | ORAL_TABLET | Freq: Every day | ORAL | Status: DC

## 2015-04-20 NOTE — Progress Notes (Signed)
BP 128/85 mmHg  Pulse 81  Temp(Src) 98 F (36.7 C) (Oral)  Ht 5\' 10"  (1.778 m)  Wt 225 lb 6.4 oz (102.241 kg)  BMI 32.34 kg/m2   Subjective:    Patient ID: Alison Garrett, female    DOB: [birth year], 70 y.o.   MRN: [id number]  HPI: Alison Garrett is a 70 y.o. female presenting on 04/20/2015 for Fibromyalgia   HPI Fibromyalgia recheck Patient was seen last time for fibromyalgia checkup and her nortriptyline was lowered from 25-10 mg because she was feeling too sedated and couldn't get up in the morning. We discussed other possible medications and she has tried many of them and has many of them listed on her allergy list for reactions she did not like. We discussed the possibility of alternative treatment such as exercise regimens and dietary changes and weight loss. She thinks this may be the option that she would like to try first before going on any kind of further medication. She still has the same kind of diffuse myalgias and unexplained aches.  Relevant past medical, surgical, family and social history reviewed and updated as indicated. Interim medical history since our last visit reviewed. Allergies and medications reviewed and updated.  Review of Systems  Constitutional: Negative for fever and chills.  HENT: Negative for congestion, ear discharge and ear pain.   Eyes: Negative for redness and visual disturbance.  Respiratory: Negative for chest tightness and shortness of breath.   Cardiovascular: Negative for chest pain and leg swelling.  Genitourinary: Negative for dysuria and difficulty urinating.  Musculoskeletal: Positive for myalgias and arthralgias. Negative for back pain, joint swelling and gait problem.  Skin: Negative for rash.  Neurological: Negative for dizziness, light-headedness and headaches.  Psychiatric/Behavioral: Negative for suicidal ideas, behavioral problems, sleep disturbance, self-injury and agitation.  All other systems reviewed and are negative.   Per  HPI unless specifically indicated above     Medication List       This list is accurate as of: 04/20/15  9:30 AM.  Always use your most recent med list.               aspirin 81 MG tablet  Take 81 mg by mouth daily.     hydrochlorothiazide 12.5 MG tablet  Commonly known as:  HYDRODIURIL  Take 2 tablets (25 mg total) by mouth daily. Take 1/2 daily           Objective:    BP 128/85 mmHg  Pulse 81  Temp(Src) 98 F (36.7 C) (Oral)  Ht 5\' 10"  (1.778 m)  Wt 225 lb 6.4 oz (102.241 kg)  BMI 32.34 kg/m2  Wt Readings from Last 3 Encounters:  04/20/15 225 lb 6.4 oz (102.241 kg)  02/09/15 222 lb (100.699 kg)  01/12/15 223 lb 12.8 oz (101.515 kg)    Physical Exam  Constitutional: She is oriented to person, place, and time. She appears well-developed and well-nourished. No distress.  Eyes: Conjunctivae and EOM are normal. Pupils are equal, round, and reactive to light.  Cardiovascular: Normal rate, regular rhythm, normal heart sounds and intact distal pulses.   No murmur heard. Pulmonary/Chest: Effort normal and breath sounds normal. No respiratory distress. She has no wheezes.  Musculoskeletal: Normal range of motion. She exhibits no edema or tenderness.  Neurological: She is alert and oriented to person, place, and time. Coordination normal.  Skin: Skin is warm and dry. No rash noted. She is not diaphoretic.  Psychiatric: Her behavior is normal. Judgment  and thought content normal. Her mood appears anxious. She expresses no suicidal ideation. She expresses no suicidal plans.  Nursing note and vitals reviewed.      Assessment & Plan:   Problem List Items Addressed This Visit      Musculoskeletal and Integument   Fibromyalgia - Primary    Patient would like to try exercise alone first and avoid medications because of side effects.          Follow up plan: Return if symptoms worsen or fail to improve.  Counseling provided for all of the vaccine components No orders  of the defined types were placed in this encounter.    Caryl Pina, MD [facility] 04/20/2015, 9:30 AM

## 2015-04-20 NOTE — Assessment & Plan Note (Signed)
Patient would like to try exercise alone first and avoid medications because of side effects.

## 2015-05-30 ENCOUNTER — Emergency Department (HOSPITAL_COMMUNITY)
Admission: EM | Admit: 2015-05-30 | Discharge: 2015-05-30 | Disposition: A | Discharge status: Home / Self Care | Payer: Commercial Managed Care - HMO | Attending: Emergency Medicine | Admitting: Emergency Medicine

## 2015-05-30 ENCOUNTER — Emergency Department (HOSPITAL_COMMUNITY): Payer: Commercial Managed Care - HMO

## 2015-05-30 ENCOUNTER — Encounter (HOSPITAL_COMMUNITY): Payer: Self-pay

## 2015-05-30 DIAGNOSIS — I10 Essential (primary) hypertension: Secondary | ICD-10-CM | POA: Diagnosis not present

## 2015-05-30 DIAGNOSIS — Z8739 Personal history of other diseases of the musculoskeletal system and connective tissue: Secondary | ICD-10-CM | POA: Insufficient documentation

## 2015-05-30 DIAGNOSIS — E669 Obesity, unspecified: Secondary | ICD-10-CM | POA: Insufficient documentation

## 2015-05-30 DIAGNOSIS — R002 Palpitations: Secondary | ICD-10-CM

## 2015-05-30 DIAGNOSIS — I480 Paroxysmal atrial fibrillation: Secondary | ICD-10-CM | POA: Diagnosis not present

## 2015-05-30 DIAGNOSIS — Z79899 Other long term (current) drug therapy: Secondary | ICD-10-CM | POA: Diagnosis not present

## 2015-05-30 DIAGNOSIS — Z7982 Long term (current) use of aspirin: Secondary | ICD-10-CM | POA: Diagnosis not present

## 2015-05-30 DIAGNOSIS — R079 Chest pain, unspecified: Secondary | ICD-10-CM

## 2015-05-30 DIAGNOSIS — Z8669 Personal history of other diseases of the nervous system and sense organs: Secondary | ICD-10-CM | POA: Insufficient documentation

## 2015-05-30 DIAGNOSIS — Z8719 Personal history of other diseases of the digestive system: Secondary | ICD-10-CM | POA: Insufficient documentation

## 2015-05-30 DIAGNOSIS — Z880 Allergy status to penicillin: Secondary | ICD-10-CM | POA: Insufficient documentation

## 2015-05-30 DIAGNOSIS — R0602 Shortness of breath: Secondary | ICD-10-CM | POA: Diagnosis not present

## 2015-05-30 LAB — BASIC METABOLIC PANEL
ANION GAP: 10 (ref 5–15)
BUN: 18 mg/dL (ref 6–20)
CALCIUM: 8.9 mg/dL (ref 8.9–10.3)
CO2: 27 mmol/L (ref 22–32)
Chloride: 104 mmol/L (ref 101–111)
Creatinine, Ser: 1.19 mg/dL — ABNORMAL HIGH (ref 0.44–1.00)
GFR calc Af Amer: 53 mL/min — ABNORMAL LOW (ref >60)
GFR, EST NON AFRICAN AMERICAN: 46 mL/min — AB (ref >60)
Glucose, Bld: 143 mg/dL — ABNORMAL HIGH (ref 65–99)
POTASSIUM: 3.8 mmol/L (ref 3.5–5.1)
Sodium: 141 mmol/L (ref 135–145)

## 2015-05-30 LAB — CBC
HCT: 41.5 % (ref 36.0–46.0)
HEMOGLOBIN: 14.2 g/dL (ref 12.0–15.0)
MCH: 29.3 pg (ref 26.0–34.0)
MCHC: 34.2 g/dL (ref 30.0–36.0)
MCV: 85.7 fL (ref 78.0–100.0)
Platelets: 205 10*3/uL (ref 150–400)
RBC: 4.84 MIL/uL (ref 3.87–5.11)
RDW: 12.9 % (ref 11.5–15.5)
WBC: 6.9 10*3/uL (ref 4.0–10.5)

## 2015-05-30 LAB — TROPONIN I

## 2015-05-30 MED ORDER — DILTIAZEM HCL 30 MG PO TABS: 30.0000 mg | ORAL_TABLET | Freq: Four times a day (QID) | ORAL | Status: DC | PRN

## 2015-05-30 NOTE — Discharge Instructions (Signed)

## 2015-05-30 NOTE — ED Notes (Signed)
Pt states "I feel like my hearts in afib again." Pt complaining of substernal chest pain. Pt states woke up with discomfort this evening.

## 2015-05-30 NOTE — ED Provider Notes (Signed)
CSN: [id number]     Arrival date & time 05/30/15  0235 History  By signing my name below, I, Altamease Oiler, attest that this documentation has been prepared under the direction and in the presence of Jola Schmidt, MD. Electronically Signed: Altamease Oiler, ED Scribe. 05/30/2015. 3:13 AM   Chief Complaint  Patient presents with  . Chest Pain  . Atrial Fibrillation   The history is provided by the patient. No language interpreter was used.  Alison Garrett is a 70 y.o. female with history of a-fib, HTN, and dyslipidemia who presents to the Emergency Department complaining of palpitations with onset a few hours prior to arrival. Pt states that she woke up and felt like her heart was "jumping around". The sensation is similar to what she has experienced in the past with a-fib. Her palpitations improved on the way to the ED. Associated symptoms include bilateral calf numbness since yesterday.  She is not on a anticoagulant, though she used to take Eliquis but it caused headaches and epistaxis, and does not take a beta blocker because she had an an allergic reaction, burning chest pain and nausea, in the past. Pt states that she never received the Cardizem that her cardiologist prescribed. Her last episode of a-fib was 6 months ago.  She sees Dr.Hochrein for cardiology and Dr. Laurance Flatten for primary care.    Past Medical History  Diagnosis Date  . Fibromyalgia     Not currently under active treatment  . GERD (gastroesophageal reflux disease)   . Hiatal hernia   . Hypertension   . Dyslipidemia   . Obesity   . Essential tremor 01/12/2014  . Afib [facility])    Past Surgical History  Procedure Laterality Date  . Abdominal hysterectomy     Family History  Problem Relation Age of Onset  . Coronary artery disease Mother   . Heart disease Mother     CABG  . Cancer Mother   . COPD Father   . Hepatitis Sister     autoimune  . Diabetes Sister    Social History  Substance Use Topics  . Smoking status:  Never Smoker   . Smokeless tobacco: Never Used  . Alcohol Use: No   OB History    No data available     Review of Systems  10 Systems reviewed and all are negative for acute change except as noted in the HPI.  Allergies  Contrast media; Penicillins; Benicar; Sulfonamide derivatives; Celebrex; Cymbalta; Neurontin; Norvasc; Prozac; Xanax; Zoloft; and Zyprexa  Home Medications   Prior to Admission medications   Medication Sig Start Date End Date Taking? Authorizing Provider  aspirin 81 MG tablet Take 81 mg by mouth daily.    Historical Provider, MD  hydrochlorothiazide (HYDRODIURIL) 12.5 MG tablet Take 2 tablets (25 mg total) by mouth daily. Take 1/2 daily 04/20/15   Fransisca Kaufmann Dettinger, MD   BP 145/83 mmHg  Pulse 95  Temp(Src) 98 F (36.7 C) (Oral)  Resp 16  SpO2 94% Physical Exam  Constitutional: She is oriented to person, place, and time. She appears well-developed and well-nourished. No distress.  HENT:  Head: Normocephalic and atraumatic.  Eyes: EOM are normal.  Neck: Normal range of motion.  Cardiovascular: Normal rate, regular rhythm and normal heart sounds.   Pulmonary/Chest: Effort normal and breath sounds normal.  Abdominal: Soft. She exhibits no distension. There is no tenderness.  Musculoskeletal: Normal range of motion.  Neurological: She is alert and oriented to person, place, and time.  Skin: Skin is warm and dry.  Psychiatric: She has a normal mood and affect. Judgment normal.  Nursing note and vitals reviewed.   ED Course  Procedures (including critical care time) DIAGNOSTIC STUDIES: Oxygen Saturation is 94% on RA,  normal by my interpretation.    COORDINATION OF CARE: 3:09 AM Discussed treatment plan which includes lab work, CXR, EKG with pt at bedside and pt agreed to plan.  Labs Review Labs Reviewed  BASIC METABOLIC PANEL - Abnormal; Notable for the following:    Glucose, Bld 143 (*)    Creatinine, Ser 1.19 (*)    GFR calc non Af Amer 46 (*)     GFR calc Af Amer 53 (*)    All other components within normal limits  CBC  TROPONIN I    Imaging Review Dg Chest 2 View  05/30/2015  CLINICAL DATA:  Shortness of breath. History of atrial fibrillation. EXAM: CHEST  2 VIEW COMPARISON:  08/11/2014 FINDINGS: The heart size and mediastinal contours are within normal limits. Both lungs are clear. The visualized skeletal structures are unremarkable. IMPRESSION: No active cardiopulmonary disease. Electronically Signed   By: Lucienne Capers M.D.   On: 05/30/2015 03:47   I have personally reviewed and evaluated these images and lab results as part of my medical decision-making.   EKG Interpretation   Date/Time:  Monday May 30 2015 02:40:07 EDT Ventricular Rate:  97 PR Interval:  194 QRS Duration: 92 QT Interval:  378 QTC Calculation: 480 R Axis:   39 Text Interpretation:  Sinus rhythm with Premature atrial complexes  Nonspecific ST abnormality Abnormal ECG No significant change was found  Confirmed by Braiden Rodman  MD, Al Bracewell ([id number]) on 05/30/2015 4:09:05 AM      MDM   Final diagnoses:  Chest pain, unspecified chest pain type  Palpitations  Paroxysmal a-fib ([place])   Patient is overall well-appearing.  She is in sinus rhythm at this time.  She has a history of paroxysmal atrial fibrillation and this was likely repeat A. fib tonight.  Resolution of symptoms without ED intervention.  Patient is post be on 30 mg of Cardizem when necessary palpitations.  She does not have this prescription filled.  She'll be given a refill this prescription.  Vas that she follow-up with her cardiologist and primary care doctor.  No indication for additional workup.  Doubt ACS.  EKG without ischemic changes.  I personally performed the services described in this documentation, which was scribed in my presence. The recorded information has been reviewed and is accurate.       Jola Schmidt, MD 05/30/15 [phone]

## 2015-06-07 ENCOUNTER — Ambulatory Visit (INDEPENDENT_AMBULATORY_CARE_PROVIDER_SITE_OTHER): Payer: Commercial Managed Care - HMO | Admitting: Family Medicine

## 2015-06-07 ENCOUNTER — Encounter: Payer: Self-pay | Admitting: Family Medicine

## 2015-06-07 ENCOUNTER — Encounter (INDEPENDENT_AMBULATORY_CARE_PROVIDER_SITE_OTHER): Payer: Self-pay

## 2015-06-07 ENCOUNTER — Encounter: Payer: Self-pay | Admitting: *Deleted

## 2015-06-07 VITALS — BP 139/87 | HR 88 | Temp 97.3°F | Ht 70.0 in | Wt 225.0 lb

## 2015-06-07 DIAGNOSIS — R202 Paresthesia of skin: Secondary | ICD-10-CM | POA: Diagnosis not present

## 2015-06-07 DIAGNOSIS — M797 Fibromyalgia: Secondary | ICD-10-CM

## 2015-06-07 MED ORDER — MILNACIPRAN HCL 12.5 & 25 & 50 MG PO MISC: ORAL | Status: DC

## 2015-06-07 NOTE — Progress Notes (Signed)
Subjective:  Patient ID: Alison Garrett, female    DOB: May 18, 1945  Age: 70 y.o. MRN: [id number]  CC: Leg Pain   HPI Alison Garrett presents for Continued pain around the right ankle and lower leg. There is minimal edema associated as well. She tends to have pain going up the legs as well. She states that she has some right neck and shoulder pain as well History Alison Garrett has a past medical history of Fibromyalgia; GERD (gastroesophageal reflux disease); Hiatal hernia; Hypertension; Dyslipidemia; Obesity; Essential tremor (01/12/2014); and Afib ([place]).   She has past surgical history that includes Abdominal hysterectomy.   Her family history includes COPD in her father; Cancer in her mother; Coronary artery disease in her mother; Diabetes in her sister; Heart disease in her mother; Hepatitis in her sister.She reports that she has never smoked. She has never used smokeless tobacco. She reports that she does not drink alcohol or use illicit drugs.    ROS Review of Systems  Constitutional: Negative for fever, activity change and appetite change.  HENT: Negative for congestion, rhinorrhea and sore throat.   Eyes: Negative for visual disturbance.  Respiratory: Negative for cough and shortness of breath.   Cardiovascular: Negative for chest pain and palpitations.  Gastrointestinal: Negative for nausea, abdominal pain and diarrhea.  Genitourinary: Negative for dysuria.  Musculoskeletal: Negative for myalgias and arthralgias.    Objective:  BP 139/87 mmHg  Pulse 88  Temp(Src) 97.3 F (36.3 C) (Oral)  Ht 5\' 10"  (1.778 m)  Wt 225 lb (102.059 kg)  BMI 32.28 kg/m2  SpO2 97%  BP Readings from Last 3 Encounters:  06/07/15 139/87  05/30/15 123/81  04/20/15 128/85    Wt Readings from Last 3 Encounters:  06/07/15 225 lb (102.059 kg)  04/20/15 225 lb 6.4 oz (102.241 kg)  02/09/15 222 lb (100.699 kg)     Physical Exam  Constitutional: She is oriented to person, place, and time. She appears  well-developed and well-nourished. No distress.  HENT:  Head: Normocephalic and atraumatic.  Right Ear: External ear normal.  Left Ear: External ear normal.  Nose: Nose normal.  Mouth/Throat: Oropharynx is clear and moist.  Eyes: Conjunctivae and EOM are normal. Pupils are equal, round, and reactive to light.  Neck: Normal range of motion. Neck supple. No thyromegaly present.  Cardiovascular: Normal rate, regular rhythm and normal heart sounds.   No murmur heard. Pulmonary/Chest: Effort normal and breath sounds normal. No respiratory distress. She has no wheezes. She has no rales.  Abdominal: Soft. Bowel sounds are normal. She exhibits no distension. There is no tenderness.  Lymphadenopathy:    She has no cervical adenopathy.  Neurological: She is alert and oriented to person, place, and time. She has normal reflexes.  Skin: Skin is warm and dry.  Psychiatric: She has a normal mood and affect. Her behavior is normal. Judgment and thought content normal.     Lab Results  Component Value Date   WBC 6.9 05/30/2015   HGB 14.2 05/30/2015   HCT 41.5 05/30/2015   PLT 205 05/30/2015   GLUCOSE 143* 05/30/2015   CHOL 177 02/09/2015   TRIG 135 02/09/2015   HDL 38* 02/09/2015   LDLCALC 105* 06/18/2013   ALT 35* 02/09/2015   AST 30 02/09/2015   NA 141 05/30/2015   K 3.8 05/30/2015   CL 104 05/30/2015   CREATININE 1.19* 05/30/2015   BUN 18 05/30/2015   CO2 27 05/30/2015   TSH 0.599 11/16/2014   HGBA1C  5.5 02/18/2013    Dg Chest 2 View  05/30/2015  CLINICAL DATA:  Shortness of breath. History of atrial fibrillation. EXAM: CHEST  2 VIEW COMPARISON:  08/11/2014 FINDINGS: The heart size and mediastinal contours are within normal limits. Both lungs are clear. The visualized skeletal structures are unremarkable. IMPRESSION: No active cardiopulmonary disease. Electronically Signed   By: Lucienne Capers M.D.   On: 05/30/2015 03:47    Assessment & Plan:   Aedan was seen today for leg  pain.  Diagnoses and all orders for this visit:  Fibromyalgia  Paresthesias  Other orders -     Milnacipran HCl 12.5 & 25 & 50 MG MISC; Follow label directions to build dose.    I am having Alison Garrett start on Milnacipran HCl. I am also having her maintain her aspirin, diltiazem, and hydrochlorothiazide.  Meds ordered this encounter  Medications  . hydrochlorothiazide (HYDRODIURIL) 25 MG tablet    Sig: Take 12.5 mg by mouth daily.  . Milnacipran HCl 12.5 & 25 & 50 MG MISC    Sig: Follow label directions to build dose.    Dispense:  1 each    Refill:  0     Follow-up: Return in about 4 weeks (around 07/05/2015), or with  Dr. Warrick Parisian, for fibromyalgia.  Claretta Fraise, M.D.

## 2015-06-14 ENCOUNTER — Encounter: Payer: Self-pay | Admitting: *Deleted

## 2015-06-21 ENCOUNTER — Telehealth: Payer: Self-pay | Admitting: Cardiology

## 2015-06-21 NOTE — Telephone Encounter (Signed)
Can you please schedule this pt an appt in [place], pt have an appt to see Dr Percival Spanish on June 28th,but would like to be seen sooner, because she is not feeling well

## 2015-06-21 NOTE — Telephone Encounter (Signed)
Leave message for pt to call back 

## 2015-06-21 NOTE — Telephone Encounter (Signed)
Pt stated that she's not feeling well, her legs ache all the time and that is very usual to her, she's feeling very tired and when she start doing something her heart race and she feel like she's going to past out, pt stated she will like a sooner appt then the one in June.

## 2015-06-21 NOTE — Telephone Encounter (Signed)
F/u  Pt returningphone call. Please call back and discuss.   

## 2015-06-21 NOTE — Telephone Encounter (Signed)
Alison Garrett from the [employer] office called in wanting to make Dr. Percival Spanish aware that the pt was seen in the ER for Afib related issues. The pt voiced that she was instructed to follow up with Dr. Percival Spanish as soon as possible. After reviewing the pt's discharge note, there was nothing that indicated that. If you could contact pt and check to see how she is feeling to see if she will need to be seen sooner than 6/28(Dr. Hochrein's next available in [place]). I would greatly appreciate it   Thanks

## 2015-06-22 NOTE — Telephone Encounter (Signed)
appt moved up to the next time Dr Percival Spanish is in [facility] 5/24 at 1pm.  Left message for pt of appt change and to c/b if further needs or concerns.

## 2015-06-25 ENCOUNTER — Emergency Department (HOSPITAL_COMMUNITY)
Admission: EM | Admit: 2015-06-25 | Discharge: 2015-06-25 | Disposition: A | Discharge status: Home / Self Care | Payer: Commercial Managed Care - HMO | Attending: Emergency Medicine | Admitting: Emergency Medicine

## 2015-06-25 ENCOUNTER — Encounter (HOSPITAL_COMMUNITY): Payer: Self-pay | Admitting: Nurse Practitioner

## 2015-06-25 DIAGNOSIS — Z91040 Latex allergy status: Secondary | ICD-10-CM | POA: Insufficient documentation

## 2015-06-25 DIAGNOSIS — Z79899 Other long term (current) drug therapy: Secondary | ICD-10-CM | POA: Diagnosis not present

## 2015-06-25 DIAGNOSIS — I4891 Unspecified atrial fibrillation: Secondary | ICD-10-CM | POA: Diagnosis not present

## 2015-06-25 DIAGNOSIS — Z7982 Long term (current) use of aspirin: Secondary | ICD-10-CM | POA: Insufficient documentation

## 2015-06-25 DIAGNOSIS — E669 Obesity, unspecified: Secondary | ICD-10-CM | POA: Diagnosis not present

## 2015-06-25 DIAGNOSIS — Z880 Allergy status to penicillin: Secondary | ICD-10-CM | POA: Diagnosis not present

## 2015-06-25 DIAGNOSIS — R0602 Shortness of breath: Secondary | ICD-10-CM | POA: Insufficient documentation

## 2015-06-25 DIAGNOSIS — R079 Chest pain, unspecified: Secondary | ICD-10-CM | POA: Diagnosis not present

## 2015-06-25 DIAGNOSIS — Z8719 Personal history of other diseases of the digestive system: Secondary | ICD-10-CM | POA: Diagnosis not present

## 2015-06-25 DIAGNOSIS — R1013 Epigastric pain: Secondary | ICD-10-CM | POA: Insufficient documentation

## 2015-06-25 DIAGNOSIS — M797 Fibromyalgia: Secondary | ICD-10-CM | POA: Insufficient documentation

## 2015-06-25 DIAGNOSIS — I10 Essential (primary) hypertension: Secondary | ICD-10-CM | POA: Diagnosis not present

## 2015-06-25 LAB — CBC
HCT: 44.3 % (ref 36.0–46.0)
HEMOGLOBIN: 15 g/dL (ref 12.0–15.0)
MCH: 29.3 pg (ref 26.0–34.0)
MCHC: 33.9 g/dL (ref 30.0–36.0)
MCV: 86.5 fL (ref 78.0–100.0)
Platelets: 216 10*3/uL (ref 150–400)
RBC: 5.12 MIL/uL — ABNORMAL HIGH (ref 3.87–5.11)
RDW: 12.9 % (ref 11.5–15.5)
WBC: 8.1 10*3/uL (ref 4.0–10.5)

## 2015-06-25 LAB — HEPATIC FUNCTION PANEL
ALT: 32 U/L (ref 14–54)
AST: 29 U/L (ref 15–41)
Albumin: 3.6 g/dL (ref 3.5–5.0)
Alkaline Phosphatase: 82 U/L (ref 38–126)
Total Bilirubin: 0.9 mg/dL (ref 0.3–1.2)
Total Protein: 7.5 g/dL (ref 6.5–8.1)

## 2015-06-25 LAB — I-STAT TROPONIN, ED
TROPONIN I, POC: 0 ng/mL (ref 0.00–0.08)
Troponin i, poc: 0 ng/mL (ref 0.00–0.08)

## 2015-06-25 LAB — BASIC METABOLIC PANEL
ANION GAP: 11 (ref 5–15)
BUN: 14 mg/dL (ref 6–20)
CALCIUM: 9.6 mg/dL (ref 8.9–10.3)
CO2: 28 mmol/L (ref 22–32)
Chloride: 103 mmol/L (ref 101–111)
Creatinine, Ser: 1.13 mg/dL — ABNORMAL HIGH (ref 0.44–1.00)
GFR calc Af Amer: 57 mL/min — ABNORMAL LOW (ref >60)
GFR, EST NON AFRICAN AMERICAN: 49 mL/min — AB (ref >60)
GLUCOSE: 144 mg/dL — AB (ref 65–99)
Potassium: 3.7 mmol/L (ref 3.5–5.1)
SODIUM: 142 mmol/L (ref 135–145)

## 2015-06-25 LAB — LIPASE, BLOOD: LIPASE: 22 U/L (ref 11–51)

## 2015-06-25 MED ORDER — ASPIRIN 81 MG PO CHEW
324.0000 mg | CHEWABLE_TABLET | Freq: Once | ORAL | Status: AC
  Administered 2015-06-25: 324 mg via ORAL
  Filled 2015-06-25: qty 4

## 2015-06-25 MED ORDER — NITROGLYCERIN 0.4 MG SL SUBL: 0.4000 mg | SUBLINGUAL_TABLET | SUBLINGUAL | Status: DC | PRN

## 2015-06-25 MED ORDER — ALUM & MAG HYDROXIDE-SIMETH 200-200-20 MG/5ML PO SUSP
15.0000 mL | Freq: Once | ORAL | Status: AC
  Administered 2015-06-25: 15 mL via ORAL
  Filled 2015-06-25: qty 30

## 2015-06-25 MED ORDER — LIDOCAINE VISCOUS 2 % MT SOLN
15.0000 mL | Freq: Once | OROMUCOSAL | Status: AC
  Administered 2015-06-25: 15 mL via OROMUCOSAL
  Filled 2015-06-25: qty 15

## 2015-06-25 NOTE — Discharge Instructions (Signed)
Try Zantac 150 mg twice a day. Follow-up with family doctor. Nonspecific Chest Pain It is often hard to find the cause of chest pain. There is always a chance that your pain could be related to something serious, such as a heart attack or a blood clot in your lungs. Chest pain can also be caused by conditions that are not life-threatening. If you have chest pain, it is very important to follow up with your doctor.  HOME CARE  If you were prescribed an antibiotic medicine, finish it all even if you start to feel better.  Avoid any activities that cause chest pain.  Do not use any tobacco products, including cigarettes, chewing tobacco, or electronic cigarettes. If you need help quitting, ask your doctor.  Do not drink alcohol.  Take medicines only as told by your doctor.  Keep all follow-up visits as told by your doctor. This is important. This includes any further testing if your chest pain does not go away.  Your doctor may tell you to keep your head raised (elevated) while you sleep.  Make lifestyle changes as told by your doctor. These may include:  Getting regular exercise. Ask your doctor to suggest some activities that are safe for you.  Eating a heart-healthy diet. Your doctor or a diet specialist (dietitian) can help you to learn healthy eating options.  Maintaining a healthy weight.  Managing diabetes, if necessary.  Reducing stress. GET HELP IF:  Your chest pain does not go away, even after treatment.  You have a rash with blisters on your chest.  You have a fever. GET HELP RIGHT AWAY IF:  Your chest pain is worse.  You have an increasing cough, or you cough up blood.  You have severe belly (abdominal) pain.  You feel extremely weak.  You pass out (faint).  You have chills.  You have sudden, unexplained chest discomfort.  You have sudden, unexplained discomfort in your arms, back, neck, or jaw.  You have shortness of breath at any time.  You  suddenly start to sweat, or your skin gets clammy.  You feel nauseous.  You vomit.  You suddenly feel light-headed or dizzy.  Your heart begins to beat quickly, or it feels like it is skipping beats. These symptoms may be an emergency. Do not wait to see if the symptoms will go away. Get medical help right away. Call your local emergency services (911 in the [place] Do not drive yourself to the hospital.   This information is not intended to replace advice given to you by your health care provider. Make sure you discuss any questions you have with your health care provider.   Document Released: 07/18/2007 Document Revised: 02/19/2014 Document Reviewed: 09/04/2013 Elsevier Interactive Patient Education [employer].

## 2015-06-25 NOTE — ED Provider Notes (Signed)
CSN: [id number]     Arrival date & time 06/25/15  1610 History   First MD Initiated Contact with Patient 06/25/15 1622     Chief Complaint  Patient presents with  . Chest Pain  . Abdominal Pain     (Consider location/radiation/quality/duration/timing/severity/associated sxs/prior Treatment) Patient is a 70 y.o. female presenting with chest pain and abdominal pain. The history is provided by the patient.  Chest Pain Associated symptoms: abdominal pain   Associated symptoms: no dizziness, no fever, no headache, no nausea, no palpitations, no shortness of breath and not vomiting   Abdominal Pain Pain location:  Epigastric Pain quality: sharp and shooting   Pain radiates to:  Does not radiate Pain severity:  Moderate Onset quality:  Gradual Duration:  1 week Timing:  Intermittent Progression:  Waxing and waning Chronicity:  Recurrent Context: eating   Relieved by:  Nothing Worsened by:  Nothing tried Ineffective treatments:  None tried Associated symptoms: no chest pain, no chills, no dysuria, no fever, no nausea, no shortness of breath and no vomiting    70 yo F With a chief complaint of epigastric abdominal pain. Going on for about a week. Symptoms are off and on. Patient is unsure what makes it better or worse though has had some issues after eating. Denies fevers or chills denies nausea or vomiting. Denies diaphoresis. Today the patient was also feeding chickens and started having some chest pain. Felt that was diffuse hard to describe. Denies radiation. Has some shortness of breath associated with it. She is a cardiologist for atrial fibrillation. No history of coronary disease. Positive family history hypertension.  Past Medical History  Diagnosis Date  . Fibromyalgia     Not currently under active treatment  . GERD (gastroesophageal reflux disease)   . Hiatal hernia   . Hypertension   . Dyslipidemia   . Obesity   . Essential tremor 01/12/2014  . Afib [facility])    Past  Surgical History  Procedure Laterality Date  . Abdominal hysterectomy     Family History  Problem Relation Age of Onset  . Coronary artery disease Mother   . Heart disease Mother     CABG  . Cancer Mother   . COPD Father   . Hepatitis Sister     autoimune  . Diabetes Sister    Social History  Substance Use Topics  . Smoking status: Never Smoker   . Smokeless tobacco: Never Used  . Alcohol Use: No   OB History    No data available     Review of Systems  Constitutional: Negative for fever and chills.  HENT: Negative for congestion and rhinorrhea.   Eyes: Negative for redness and visual disturbance.  Respiratory: Negative for shortness of breath and wheezing.   Cardiovascular: Negative for chest pain and palpitations.  Gastrointestinal: Positive for abdominal pain. Negative for nausea and vomiting.  Genitourinary: Negative for dysuria and urgency.  Musculoskeletal: Negative for myalgias and arthralgias.  Skin: Negative for pallor and wound.  Neurological: Negative for dizziness and headaches.      Allergies  Contrast media; Penicillins; Sulfonamide derivatives; Benicar; Celebrex; Cymbalta; Latex; Neurontin; Norvasc; Prozac; Xanax; Zoloft; and Zyprexa  Home Medications   Prior to Admission medications   Medication Sig Start Date End Date Taking? Authorizing Provider  aspirin 81 MG tablet Take 81 mg by mouth daily.   Yes Historical Provider, MD  hydrochlorothiazide (HYDRODIURIL) 25 MG tablet Take 12.5 mg by mouth daily. 05/23/15  Yes Historical Provider, MD  diltiazem (CARDIZEM) 30 MG tablet Take 1 tablet (30 mg total) by mouth every 6 (six) hours as needed (PALPITATIONS). 05/30/15   Jola Schmidt, MD  Milnacipran HCl 12.5 & 25 & 50 MG MISC Follow label directions to build dose. 06/07/15   Claretta Fraise, MD   BP 123/78 mmHg  Pulse 73  Temp(Src) 97.9 F (36.6 C) (Oral)  Resp 14  SpO2 94% Physical Exam  Constitutional: She is oriented to person, place, and time. She  appears well-developed and well-nourished. No distress.  HENT:  Head: Normocephalic and atraumatic.  Eyes: EOM are normal. Pupils are equal, round, and reactive to light.  Neck: Normal range of motion. Neck supple.  Cardiovascular: Normal rate and regular rhythm.  Exam reveals no gallop and no friction rub.   No murmur heard. Pulmonary/Chest: Effort normal. She has no wheezes. She has no rales.  Abdominal: Soft. She exhibits no distension. There is tenderness (epigastric, negative murphys). There is no guarding.  Musculoskeletal: She exhibits no edema or tenderness.  Neurological: She is alert and oriented to person, place, and time.  Skin: Skin is warm and dry. She is not diaphoretic.  Psychiatric: She has a normal mood and affect. Her behavior is normal.  Nursing note and vitals reviewed.   ED Course  Procedures (including critical care time) Labs Review Labs Reviewed  BASIC METABOLIC PANEL - Abnormal; Notable for the following:    Glucose, Bld 144 (*)    Creatinine, Ser 1.13 (*)    GFR calc non Af Amer 49 (*)    GFR calc Af Amer 57 (*)    All other components within normal limits  CBC - Abnormal; Notable for the following:    RBC 5.12 (*)    All other components within normal limits  HEPATIC FUNCTION PANEL - Abnormal; Notable for the following:    Bilirubin, Direct <0.1 (*)    All other components within normal limits  LIPASE, BLOOD  I-STAT TROPOININ, ED  I-STAT TROPOININ, ED    Imaging Review No results found. I have personally reviewed and evaluated these images and lab results as part of my medical decision-making.   EKG Interpretation   Date/Time:  Saturday Jun 25 2015 16:13:43 EDT Ventricular Rate:  95 PR Interval:  174 QRS Duration: 90 QT Interval:  380 QTC Calculation: 477 R Axis:   62 Text Interpretation:  Normal sinus rhythm Nonspecific ST and T wave  abnormality Abnormal ECG No significant change since last tracing  Confirmed by Kainon Varady MD, Quillian Quince  [id number]) on 06/25/2015 5:42:32 PM      MDM   Final diagnoses:  Chest pain, unspecified chest pain type    70 yo F with a chief complaint of chest pain. This happened just after eating and also was with some exertional component. Initial troponin was negative. EKG without significant changes. Obtain a delta troponin. Laboratory evaluation. Suspect likely GI etiology based on history.  Delta troponin negative. Will the patient follow-up with her cardiologist. Return for sudden worsening pain.  7:47 PM:  I have discussed the diagnosis/risks/treatment options with the patient and family and believe the pt to be eligible for discharge home to follow-up with PCP. We also discussed returning to the ED immediately if new or worsening sx occur. We discussed the sx which are most concerning (e.g., sudden worsening pain, fever, inability to tolerate by mouth) that necessitate immediate return. Medications administered to the patient during their visit and any new prescriptions provided to the patient are listed below.  Medications given during this visit Medications  nitroGLYCERIN (NITROSTAT) SL tablet 0.4 mg (not administered)  aspirin chewable tablet 324 mg (324 mg Oral Given 06/25/15 1731)  alum & mag hydroxide-simeth (MAALOX/MYLANTA) 200-200-20 MG/5ML suspension 15 mL (15 mLs Oral Given 06/25/15 1731)  lidocaine (XYLOCAINE) 2 % viscous mouth solution 15 mL (15 mLs Mouth/Throat Given 06/25/15 1731)    New Prescriptions   No medications on file    The patient appears reasonably screen and/or stabilized for discharge and I doubt any other medical condition or other [facility] requiring further screening, evaluation, or treatment in the ED at this time prior to discharge.    Deno Etienne, DO 06/25/15 1947

## 2015-06-25 NOTE — ED Notes (Signed)
Pt c/o 3 week history BLE pain, 1 week history intermittent sharp abd pain, and then today she began to develop chest pain. States she has not felt well all week. Reports SOB, dizziness. She denies fevers, n/v, bowel/bladder changes. She is alert and breathing easily

## 2015-07-06 ENCOUNTER — Ambulatory Visit (INDEPENDENT_AMBULATORY_CARE_PROVIDER_SITE_OTHER): Payer: Commercial Managed Care - HMO | Admitting: Cardiology

## 2015-07-06 ENCOUNTER — Encounter: Payer: Self-pay | Admitting: Cardiology

## 2015-07-06 VITALS — BP 150/100 | HR 76 | Ht 69.0 in | Wt 224.0 lb

## 2015-07-06 DIAGNOSIS — M79606 Pain in leg, unspecified: Secondary | ICD-10-CM | POA: Diagnosis not present

## 2015-07-06 MED ORDER — DILTIAZEM HCL 30 MG PO TABS: 30.0000 mg | ORAL_TABLET | Freq: Four times a day (QID) | ORAL | Status: DC | PRN

## 2015-07-06 MED ORDER — APIXABAN 5 MG PO TABS: 5.0000 mg | ORAL_TABLET | Freq: Two times a day (BID) | ORAL | Status: DC

## 2015-07-06 NOTE — Progress Notes (Signed)
HPI The patient presents for evaluation of atrial fibrillation.  She was in the hospital in October of last year. She had atrial fibrillation at home. She was transported to the emergency room. She was cardioverted successfully to sinus rhythm.  In April she was in the hospital ED after having leg pain.  The etiology of this was not clear.  They mention palpitations in those notes but she denies this.  She was in the ED earlier this month for chest pain that went away after a GI cocktail.  She thought this was her usual GI pain.    She reports that in April she had leg pain and weakness and felt very poorly but could not understand the reason.  She felt like she had the flu without fever.  She worried that she might have something like MS.  She saw her primary care but eventually things resolved.  She actually denies any cardiac symptoms.  The patient denies any new symptoms such as chest discomfort, neck or arm discomfort. There has been no new shortness of breath, PND or orthopnea. There have been no reported palpitations, presyncope or syncope.  Of note she stopped taking her Eliquis because she thought it was causing side effects like a headache.     Allergies  Allergen Reactions  . Contrast Media [Iodinated Diagnostic Agents] Anaphylaxis  . Penicillins Anaphylaxis  . Sulfonamide Derivatives Hives  . Benicar [Olmesartan] Rash  . Celebrex [Celecoxib] Anxiety  . Cymbalta [Duloxetine Hcl] Itching  . Latex Rash  . Neurontin [Gabapentin] Nausea Only  . Norvasc [Amlodipine Besylate] Swelling  . Prozac [Fluoxetine Hcl] Other (See Comments)  . Xanax [Alprazolam] Other (See Comments)  . Zoloft [Sertraline Hcl] Itching  . Zyprexa [Olanzapine] Other (See Comments)    Wt gain    Current Outpatient Prescriptions  Medication Sig Dispense Refill  . aspirin 81 MG tablet Take 81 mg by mouth daily.    . hydrochlorothiazide (HYDRODIURIL) 25 MG tablet Take 12.5 mg by mouth daily.    Marland Kitchen diltiazem  (CARDIZEM) 30 MG tablet Take 1 tablet (30 mg total) by mouth every 6 (six) hours as needed (PALPITATIONS). (Patient not taking: Reported on 07/06/2015) 300 tablet 0   No current facility-administered medications for this visit.    Past Medical History  Diagnosis Date  . Fibromyalgia     Not currently under active treatment  . GERD (gastroesophageal reflux disease)   . Hiatal hernia   . Hypertension   . Dyslipidemia   . Obesity   . Essential tremor 01/12/2014  . Afib [facility])     Past Surgical History  Procedure Laterality Date  . Abdominal hysterectomy      ROS:  As stated in the HPI and negative for all other systems.  PHYSICAL EXAM BP 150/100 mmHg  Pulse 76  Ht 5\' 9"  (1.753 m)  Wt 224 lb (101.606 kg)  BMI 33.06 kg/m2 GENERAL:  Well appearing NECK:  No jugular venous distention, waveform within normal limits, carotid upstroke brisk and symmetric, no bruits, no thyromegaly LUNGS:  Clear to auscultation bilaterally BACK:  No CVA tenderness CHEST:  Unremarkable HEART:  PMI not displaced or sustained,S1 and S2 within normal limits, no S3, no S4, no clicks, no rubs, no murmurs ABD:  Flat, positive bowel sounds normal in frequency in pitch, no bruits, no rebound, no guarding, no midline pulsatile mass, no hepatomegaly, no splenomegaly EXT:  2 plus pulses throughout, no edema, no cyanosis no clubbing    ASSESSMENT AND  PLAN  ATRIAL FIB:    Alison Garrett has a CHA2DS2 - VASc score of 3 with a risk of stroke of 3.2%.  She will remain on Eliquis.  She agrees to give Eliquis another try and to stop the ASA.  She has had no recurrent palpitations.    HTN:  Her blood pressure is elevated but this is unusual.  She will follow this at home.  I reviewed her recent readings. Marland Kitchen

## 2015-07-06 NOTE — Patient Instructions (Signed)
Medication Instructions:  Please stop your Asprin.  Start Eliquis 5 mg twice a day.  You may take Diltiazem 30 mg once every 6 hours as needed for palpitations. Continue all other medications as listed.  Follow-Up: Please follow up in 4 months with Dr Percival Spanish.  If you need a refill on your cardiac medications before your next appointment, please call your pharmacy.  Thank you for choosing [facility]!!

## 2015-07-13 ENCOUNTER — Telehealth: Payer: Self-pay | Admitting: *Deleted
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
## 2015-07-14 NOTE — Telephone Encounter (Signed)
Left message to c/b to discuss recommendations.

## 2015-07-14 NOTE — Telephone Encounter (Signed)
No special precautions.  If she thinks that she might have had a concussion she should see somebody at an urgent care or ED.

## 2015-07-19 ENCOUNTER — Telehealth: Payer: Self-pay | Admitting: Cardiology
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
## 2015-07-19 NOTE — Telephone Encounter (Signed)
Left another message for pt to c/b if she still has questions or concerns.

## 2015-07-19 NOTE — Telephone Encounter (Signed)
New message ° ° ° °Pt verbalized that she is returning call for rn  °

## 2015-08-08 ENCOUNTER — Ambulatory Visit (INDEPENDENT_AMBULATORY_CARE_PROVIDER_SITE_OTHER): Payer: Commercial Managed Care - HMO | Admitting: Family Medicine

## 2015-08-08 ENCOUNTER — Encounter: Payer: Self-pay | Admitting: Family Medicine

## 2015-08-08 VITALS — BP 139/95 | HR 94 | Temp 97.4°F | Ht 69.0 in | Wt 223.4 lb

## 2015-08-08 DIAGNOSIS — S0990XA Unspecified injury of head, initial encounter: Secondary | ICD-10-CM

## 2015-08-08 NOTE — Progress Notes (Signed)
   HPI  Patient presents today here with headache after head trauma.  Patient explains that about 3 weeks ago, 2 days after starting Eliquis, she hit her head on a tailgate when she forgot that it was still open. She states that she "saw stars" and had extreme throbbing pain at the time but did not lose consciousness or have any weakness or numbness.  She continued to have pain over the last 3 weeks and over the last 2-3 days has developed head pain that is severe on the top of the head. She describes the pain as a throbbing type pain.  She denies any memory loss, weakness or numbness.  He has stopped taking liquids since that injury. She also is not taking aspirin currently.   PMH: Smoking status noted ROS: Per HPI  Objective: BP 139/95 mmHg  Pulse 94  Temp(Src) 97.4 F (36.3 C) (Oral)  Ht 5\' 9"  (1.753 m)  Wt 223 lb 6.4 oz (101.334 kg)  BMI 32.98 kg/m2  SpO2 97% Gen: NAD, alert, cooperative with exam HEENT: NCAT, EOMI, PERRL CV: RRR, good S1/S2, no murmur Resp: CTABL, no wheezes, non-labored Ext: No edema, warm Neuro: Alert and oriented, drink 5/5 and sensation intact in all 4 extremities, cranial 2 through 12 intact, normal gait  Assessment and plan:  # Headache After head trauma using anticoagulants I discussed with her the seriousness of her injury, I have offered her CT scan tomorrow to rule out bleed versus going to the emergency room tonight. She would like to wait until tomorrow. I have discussed with her strict red flags and reasons to seek emergent medical care tonight.  Also consider possibility of postconcussive syndrome Discussed difficulty in treating this and offer neurology follow-up if she is not seeing good improvement in 2-3 weeks.     Orders Placed This Encounter  Procedures  . CT Head Wo Contrast    Standing Status: Future     Number of Occurrences:      Standing Expiration Date: 11/07/2016    Order Specific Question:  Reason for Exam (SYMPTOM   OR DIAGNOSIS REQUIRED)    Answer:  Hit her head while on eliquis, now with 3 weeks of HA, Rule out bleed    Order Specific Question:  Preferred imaging location?    Answer:  [facility] 08/08/2015, 6:56 PM

## 2015-08-10 ENCOUNTER — Encounter: Payer: Commercial Managed Care - HMO | Admitting: Cardiology

## 2015-08-10 ENCOUNTER — Telehealth: Payer: Self-pay | Admitting: Family Medicine

## 2015-08-10 ENCOUNTER — Ambulatory Visit: Payer: Commercial Managed Care - HMO | Admitting: Family Medicine

## 2015-08-11 ENCOUNTER — Ambulatory Visit (HOSPITAL_COMMUNITY)
Admission: RE | Admit: 2015-08-11 | Discharge: 2015-08-11 | Disposition: A | Discharge status: Home / Self Care | Payer: Commercial Managed Care - HMO | Source: Ambulatory Visit | Attending: Family Medicine | Admitting: Family Medicine

## 2015-08-11 DIAGNOSIS — S0990XA Unspecified injury of head, initial encounter: Secondary | ICD-10-CM | POA: Diagnosis not present

## 2015-08-11 DIAGNOSIS — X58XXXA Exposure to other specified factors, initial encounter: Secondary | ICD-10-CM | POA: Diagnosis not present

## 2015-08-11 NOTE — Telephone Encounter (Signed)
:  MRC to x-ray

## 2015-08-12 ENCOUNTER — Ambulatory Visit (INDEPENDENT_AMBULATORY_CARE_PROVIDER_SITE_OTHER): Payer: Commercial Managed Care - HMO | Admitting: Family Medicine

## 2015-08-12 ENCOUNTER — Encounter: Payer: Self-pay | Admitting: Family Medicine

## 2015-08-12 VITALS — BP 120/86 | HR 98 | Temp 97.4°F | Ht 69.0 in | Wt 221.0 lb

## 2015-08-12 DIAGNOSIS — I480 Paroxysmal atrial fibrillation: Secondary | ICD-10-CM

## 2015-08-12 DIAGNOSIS — K219 Gastro-esophageal reflux disease without esophagitis: Secondary | ICD-10-CM

## 2015-08-12 DIAGNOSIS — S0990XD Unspecified injury of head, subsequent encounter: Secondary | ICD-10-CM | POA: Diagnosis not present

## 2015-08-12 DIAGNOSIS — E559 Vitamin D deficiency, unspecified: Secondary | ICD-10-CM

## 2015-08-12 DIAGNOSIS — E785 Hyperlipidemia, unspecified: Secondary | ICD-10-CM

## 2015-08-12 NOTE — Patient Instructions (Addendum)
Medicare Annual Wellness Visit  [facility] and the medical providers at [facility] strive to bring you the best medical care.  In doing so we not only want to address your current medical conditions and concerns but also to detect new conditions early and prevent illness, disease and health-related problems.    Medicare offers a yearly Wellness Visit which allows our clinical staff to assess your need for preventative services including immunizations, lifestyle education, counseling to decrease risk of preventable diseases and screening for fall risk and other medical concerns.    This visit is provided free of charge (no copay) for all Medicare recipients. The clinical pharmacists at [facility] have begun to conduct these Wellness Visits which will also include a thorough review of all your medications.    As you primary medical provider recommend that you make an appointment for your Annual Wellness Visit if you have not done so already this year.  You may set up this appointment before you leave today or you may call back [phone] and schedule an appointment.  Please make sure when you call that you mention that you are scheduling your Annual Wellness Visit with the clinical pharmacist so that the appointment may be made for the proper length of time.     Continue current medications. Continue good therapeutic lifestyle changes which include good diet and exercise. Fall precautions discussed with patient. If an FOBT was given today- please return it to our front desk. If you are over 69 years old - you may need Prevnar 44 or the adult Pneumonia vaccine.  **Flu shots are available--- please call and schedule a FLU-CLINIC appointment**  After your visit with [place] today you will receive a survey in the mail or online from [employer] regarding your care with [place]. Please take a moment to fill this out. Your feedback is very  important to [place] as you can help [place] better understand your patient needs as well as improve your experience and satisfaction. WE CARE ABOUT YOU!!!   The patient is encouraged to restart the Eliquis when the headache from the recent head injury completely resolves. She will call us after starting the Eliquis to let us know if it aggravates her headache again. She should follow-up with cardiology as planned

## 2015-08-12 NOTE — Progress Notes (Signed)
Subjective:    Patient ID: Alison Garrett, female    DOB: Dec 21, 1945, 70 y.o.   MRN: [id number]  HPI Pt here for follow up and management of chronic medical problems which includes hyperlipidemia. She is taking medications regularly.The patient hit her head only tell gait about 3 weeks ago. She did get a CT scan of her head which showed no intracranial abnormality. The patient continues to have a slight headache at the site of her head injury from the tailgate. It is much better. She has not restarted her blood thinner or Eliquis. She is taking an aspirin a day. She denies any chest pain or pressure or episodes of palpitations. She denies any shortness of breath. She has no nausea vomiting diarrhea heartburn indigestion load in the stool or black tarry bowel movements. She is passing her water without problems.    Patient Active Problem List   Diagnosis Date Noted  . Paresthesias 06/07/2015  . Chronic kidney disease, stage 3 11/17/2014  . Atrial fibrillation with RVR ([place]) 11/16/2014  . Multiple drug allergies 08/11/2014  . Fatigue 01/12/2014  . Essential tremor 01/12/2014  . Metabolic syndrome 34/91/7915  . Hyperlipemia 09/17/2012  . Vitamin D deficiency 09/17/2012  . Fibromyalgia 09/17/2012  . Atrial fibrillation ([place]) 07/11/2011  . OBESITY, UNSPECIFIED 12/01/2008  . HYPERTENSION, BENIGN 12/01/2008  . ABNORMAL ELECTROCARDIOGRAM 12/01/2008   Outpatient Encounter Prescriptions as of 08/12/2015  Medication Sig  . hydrochlorothiazide (HYDRODIURIL) 25 MG tablet Take 12.5 mg by mouth daily.  . [DISCONTINUED] diltiazem (CARDIZEM) 30 MG tablet Take 1 tablet (30 mg total) by mouth every 6 (six) hours as needed (PALPITATIONS). (Patient not taking: Reported on 08/08/2015)   No facility-administered encounter medications on file as of 08/12/2015.     Review of Systems  Constitutional: Negative.   Eyes: Negative.   Respiratory: Negative.   Cardiovascular: Negative.   Gastrointestinal:  Negative.   Endocrine: Negative.   Genitourinary: Negative.   Musculoskeletal: Negative.   Skin: Negative.   Allergic/Immunologic: Negative.   Neurological: Positive for headaches (pressure from hitting head). Negative for dizziness.  Hematological: Negative.   Psychiatric/Behavioral: Negative.        Objective:   Physical Exam  Constitutional: She is oriented to person, place, and time. She appears well-developed and well-nourished. No distress.  HENT:  Head: Normocephalic and atraumatic.  Right Ear: External ear normal.  Left Ear: External ear normal.  Nose: Nose normal.  Mouth/Throat: Oropharynx is clear and moist.  Eyes: Conjunctivae and EOM are normal. Pupils are equal, round, and reactive to light. Right eye exhibits no discharge. Left eye exhibits no discharge. No scleral icterus.  Neck: Normal range of motion. Neck supple. No thyromegaly present.  Without bruits or thyromegaly  Cardiovascular: Normal rate, regular rhythm, normal heart sounds and intact distal pulses.   No murmur heard. Heart today had a regular rate and rhythm at 84/m  Pulmonary/Chest: Effort normal and breath sounds normal. No respiratory distress. She has no wheezes. She has no rales.  Clear anteriorly and posteriorly  Abdominal: Soft. Bowel sounds are normal. She exhibits no mass. There is tenderness. There is no rebound and no guarding.  Slight epigastric tenderness without liver or spleen enlargement and no masses and no bruits  Musculoskeletal: Normal range of motion. She exhibits no edema or tenderness.  Lymphadenopathy:    She has no cervical adenopathy.  Neurological: She is alert and oriented to person, place, and time. She has normal reflexes. No cranial nerve deficit.  Skin:  Skin is warm and dry. No rash noted.  Psychiatric: She has a normal mood and affect. Her behavior is normal. Judgment and thought content normal.  Nursing note and vitals reviewed.  BP 120/86 mmHg  Pulse 98  Temp(Src)  97.4 F (36.3 C) (Oral)  Ht _0  (1.753 m)  Wt 221 lb (100.245 kg)  BMI 32.62 kg/m2        Assessment & Plan:  1. Vitamin D deficiency -Continue current treatment pending results of lab work - CBC with Differential/Platelet; Future - VITAMIN D 25 Hydroxy (Vit-D Deficiency, Fractures); Future  2. Gastroesophageal reflux disease without esophagitis -Continue current treatment and avoid irritating foods and drinks slight caffeine and highly spiced foods. - CBC with Differential/Platelet; Future - NMR, lipoprofile; Future  3. Hyperlipemia -Continue with aggressive therapeutic lifestyle changes - BMP8+EGFR; Future - CBC with Differential/Platelet; Future - Hepatic function panel; Future - NMR, lipoprofile; Future  4. Paroxysmal atrial fibrillation (HCC) -Continue follow-up with cardiology and make every effort to restart Eliquis - CBC with Differential/Platelet; Future  5. Head injury, subsequent encounter -This is improving and the head CT was reviewed with the patient again she was given a copy of this report showed everything was negative.  Patient Instructions                       Medicare Annual Wellness Visit  [facility] and the medical providers at [facility] strive to bring you the best medical care.  In doing so we not only want to address your current medical conditions and concerns but also to detect new conditions early and prevent illness, disease and health-related problems.    Medicare offers a yearly Wellness Visit which allows our clinical staff to assess your need for preventative services including immunizations, lifestyle education, counseling to decrease risk of preventable diseases and screening for fall risk and other medical concerns.    This visit is provided free of charge (no copay) for all Medicare recipients. The clinical pharmacists at [facility] have begun to conduct these Wellness Visits which will  also include a thorough review of all your medications.    As you primary medical provider recommend that you make an appointment for your Annual Wellness Visit if you have not done so already this year.  You may set up this appointment before you leave today or you may call back [phone]) and schedule an appointment.  Please make sure when you call that you mention that you are scheduling your Annual Wellness Visit with the clinical pharmacist so that the appointment may be made for the proper length of time.     Continue current medications. Continue good therapeutic lifestyle changes which include good diet and exercise. Fall precautions discussed with patient. If an FOBT was given today- please return it to our front desk. If you are over 53 years old - you may need Prevnar 86 or the adult Pneumonia vaccine.  **Flu shots are available--- please call and schedule a FLU-CLINIC appointment**  After your visit with [place] today you will receive a survey in the mail or online from [employer] regarding your care with [place]. Please take a moment to fill this out. Your feedback is very important to [place] as you can help [place] better understand your patient needs as well as improve your experience and satisfaction. WE CARE ABOUT YOU!!!   The patient is encouraged to restart the Eliquis when the headache from the  recent head injury completely resolves. She will call us after starting the Eliquis to let us know if it aggravates her headache again. She should follow-up with cardiology as planned   Arrie Senate MD

## 2015-08-13 ENCOUNTER — Other Ambulatory Visit: Payer: Commercial Managed Care - HMO

## 2015-08-13 DIAGNOSIS — I480 Paroxysmal atrial fibrillation: Secondary | ICD-10-CM

## 2015-08-13 DIAGNOSIS — K219 Gastro-esophageal reflux disease without esophagitis: Secondary | ICD-10-CM

## 2015-08-13 DIAGNOSIS — E785 Hyperlipidemia, unspecified: Secondary | ICD-10-CM | POA: Diagnosis not present

## 2015-08-13 DIAGNOSIS — E559 Vitamin D deficiency, unspecified: Secondary | ICD-10-CM

## 2015-08-16 ENCOUNTER — Emergency Department (HOSPITAL_COMMUNITY)
Admission: EM | Admit: 2015-08-16 | Discharge: 2015-08-16 | Disposition: A | Discharge status: Home / Self Care | Payer: Commercial Managed Care - HMO | Attending: Emergency Medicine | Admitting: Emergency Medicine

## 2015-08-16 ENCOUNTER — Encounter (HOSPITAL_COMMUNITY): Payer: Self-pay | Admitting: Emergency Medicine

## 2015-08-16 ENCOUNTER — Other Ambulatory Visit: Payer: Self-pay

## 2015-08-16 ENCOUNTER — Emergency Department (HOSPITAL_COMMUNITY): Payer: Commercial Managed Care - HMO

## 2015-08-16 DIAGNOSIS — Z79899 Other long term (current) drug therapy: Secondary | ICD-10-CM | POA: Diagnosis not present

## 2015-08-16 DIAGNOSIS — I480 Paroxysmal atrial fibrillation: Secondary | ICD-10-CM | POA: Diagnosis not present

## 2015-08-16 DIAGNOSIS — I10 Essential (primary) hypertension: Secondary | ICD-10-CM | POA: Insufficient documentation

## 2015-08-16 DIAGNOSIS — Z7982 Long term (current) use of aspirin: Secondary | ICD-10-CM | POA: Insufficient documentation

## 2015-08-16 DIAGNOSIS — R000 Tachycardia, unspecified: Secondary | ICD-10-CM | POA: Diagnosis present

## 2015-08-16 LAB — BASIC METABOLIC PANEL
ANION GAP: 5 (ref 5–15)
BUN: 15 mg/dL (ref 6–20)
CHLORIDE: 104 mmol/L (ref 101–111)
CO2: 30 mmol/L (ref 22–32)
Calcium: 8.8 mg/dL — ABNORMAL LOW (ref 8.9–10.3)
Creatinine, Ser: 1.41 mg/dL — ABNORMAL HIGH (ref 0.44–1.00)
GFR calc Af Amer: 43 mL/min — ABNORMAL LOW (ref >60)
GFR, EST NON AFRICAN AMERICAN: 37 mL/min — AB (ref >60)
Glucose, Bld: 100 mg/dL — ABNORMAL HIGH (ref 65–99)
POTASSIUM: 4.1 mmol/L (ref 3.5–5.1)
Sodium: 139 mmol/L (ref 135–145)

## 2015-08-16 LAB — CBC WITH DIFFERENTIAL/PLATELET
Basophils Absolute: 0 10*3/uL (ref 0.0–0.1)
Basophils Relative: 0 %
EOS PCT: 1 %
Eosinophils Absolute: 0.1 10*3/uL (ref 0.0–0.7)
HCT: 42.7 % (ref 36.0–46.0)
Hemoglobin: 14.6 g/dL (ref 12.0–15.0)
LYMPHS ABS: 1.2 10*3/uL (ref 0.7–4.0)
Lymphocytes Relative: 14 %
MCH: 30 pg (ref 26.0–34.0)
MCHC: 34.2 g/dL (ref 30.0–36.0)
MCV: 87.7 fL (ref 78.0–100.0)
MONOS PCT: 9 %
Monocytes Absolute: 0.7 10*3/uL (ref 0.1–1.0)
Neutro Abs: 6.1 10*3/uL (ref 1.7–7.7)
Neutrophils Relative %: 76 %
PLATELETS: 212 10*3/uL (ref 150–400)
RBC: 4.87 MIL/uL (ref 3.87–5.11)
RDW: 12.8 % (ref 11.5–15.5)
WBC: 8 10*3/uL (ref 4.0–10.5)

## 2015-08-16 LAB — I-STAT TROPONIN, ED: TROPONIN I, POC: 0 ng/mL (ref 0.00–0.08)

## 2015-08-16 LAB — MAGNESIUM: MAGNESIUM: 2.1 mg/dL (ref 1.7–2.4)

## 2015-08-16 NOTE — ED Provider Notes (Signed)
CSN: [id number]     Arrival date & time 08/16/15  [id number] History  By signing my name below, I, Emmanuella Mensah, attest that this documentation has been prepared under the direction and in the presence of Forde Dandy, MD. Electronically Signed: Judithann Sauger, ED Scribe. 08/16/2015. 10:34 AM.    Chief Complaint  Patient presents with  . Tachycardia   The history is provided by the patient. No language interpreter was used.   HPI Comments: Alison Garrett is a 70 y.o. female with a hx of A-fib and hypertension who presents to the Emergency Department for evaluation s/p sudden onset of an episode of tachycardia of 120 PTA this am at 8:30. She reports one episode of associated SOB which resolved after sitting down and mild lightheadedness without a near syncopal sensation. She states that she took Diltiazem after onset of her symptoms. She adds that she was advised to come to the ED for evaluation if she takes that medication. No other alleviating factors noted. She reports that she no longer takes anti-coagulants after hitting her head recently and is due to start back on the medication in 2 weeks. She denies any leg swelling, chest pain, decreased appetite, abdominal pain, n/v/d, cough, rhinorrhea, or sore throat. No recent illness. Eating and drinking normally,.   Past Medical History  Diagnosis Date  . Fibromyalgia     Not currently under active treatment  . GERD (gastroesophageal reflux disease)   . Hiatal hernia   . Hypertension   . Dyslipidemia   . Obesity   . Essential tremor 01/12/2014  . Afib [facility])    Past Surgical History  Procedure Laterality Date  . Abdominal hysterectomy     Family History  Problem Relation Age of Onset  . Coronary artery disease Mother   . Heart disease Mother     CABG  . Cancer Mother   . COPD Father   . Hepatitis Sister     autoimune  . Diabetes Sister    Social History  Substance Use Topics  . Smoking status: Never Smoker   . Smokeless tobacco:  Never Used  . Alcohol Use: No   OB History    No data available     Review of Systems  10/14 systems reviewed and are negative other than those stated in the HPI   Allergies  Contrast media; Penicillins; Sulfonamide derivatives; Benicar; Celebrex; Cymbalta; Latex; Neurontin; Norvasc; Prozac; Xanax; Zoloft; and Zyprexa  Home Medications   Prior to Admission medications   Medication Sig Start Date End Date Taking? Authorizing Provider  aspirin EC 81 MG tablet Take 81 mg by mouth daily.   Yes Historical Provider, MD  hydrochlorothiazide (HYDRODIURIL) 25 MG tablet Take 12.5 mg by mouth daily. 05/23/15  Yes Historical Provider, MD   BP 126/86 mmHg  Pulse 84  Temp(Src) 97.5 F (36.4 C) (Oral)  Resp 18  Ht 5\' 9"  (1.753 m)  Wt 215 lb (97.523 kg)  BMI 31.74 kg/m2  SpO2 94% Physical Exam  Nursing note and vitals reviewed. Physical Exam  Nursing note and vitals reviewed. Constitutional: Well developed, well nourished, non-toxic, and in no acute distress Head: Normocephalic and atraumatic.  Mouth/Throat: Oropharynx is clear and moist.  Neck: Normal range of motion. Neck supple.  Cardiovascular: Normal rate and regular rhythm.   Pulmonary/Chest: Effort normal and breath sounds normal.  Abdominal: Soft. There is no tenderness. There is no rebound and no guarding.  Musculoskeletal: Normal range of motion.  Neurological: Alert, no facial  droop, fluent speech, moves all extremities symmetrically Skin: Skin is warm and dry.  Psychiatric: Cooperative   ED Course  Procedures (including critical care time) DIAGNOSTIC STUDIES: Oxygen Saturation is 95% on RA, adequate by my interpretation.    COORDINATION OF CARE: 10:20 AM- Pt advised of plan for treatment and pt agrees. Pt will receive chest x-ray and lab work for further evaluation.     Labs Review Labs Reviewed  BASIC METABOLIC PANEL - Abnormal; Notable for the following:    Glucose, Bld 100 (*)    Creatinine, Ser 1.41 (*)     Calcium 8.8 (*)    GFR calc non Af Amer 37 (*)    GFR calc Af Amer 43 (*)    All other components within normal limits  CBC WITH DIFFERENTIAL/PLATELET  MAGNESIUM  I-STAT TROPOININ, ED    Imaging Review Dg Chest 2 View  08/16/2015  CLINICAL DATA:  Atrial fib, hypertension.  Tachycardia. EXAM: CHEST  2 VIEW COMPARISON:  05/30/2015 FINDINGS: The heart size and mediastinal contours are within normal limits. Both lungs are clear. The visualized skeletal structures are unremarkable. IMPRESSION: No active cardiopulmonary disease. Electronically Signed   By: Rolm Baptise M.D.   On: 08/16/2015 11:13     Forde Dandy, MD has personally reviewed and evaluated these images and lab results as part of her medical decision-making.   EKG Interpretation   Date/Time:  Tuesday August 16 2015 09:56:57 EDT Ventricular Rate:  88 PR Interval:    QRS Duration: 94 QT Interval:  354 QTC Calculation: 429 R Axis:   23 Text Interpretation:  Sinus rhythm Borderline repolarization abnormality  Baseline wander in lead(s) III Subtle ST depression in lateral precordial  leads and inferior leads, similar to 06/25/2015 No significant change since  last tracing Confirmed by Ashaki Frosch MD, Kiante Petrovich [id number]) on 08/16/2015 11:21:29 AM      MDM   Final diagnoses:  Paroxysmal atrial fibrillation ([place])    70 year old female with history of paroxysmal atrial fibrillation who presents with episode of tachycardia. I taking 30 mg of oral diltiazem prior to arrival, and on presentation she is in normal sinus rhythm and is asymptomatic. Heart rate in the 80s to 90s and is normotensive. EKG without acute ischemic changes or evidence of acute heart strain. Chest x-ray showing no acute cardiopulmonary edema. No major electrolyte or metabolic derangements. Presentation not concerning for ACS. She does have mild acute kidney injury with a creatinine of 1.4. Her baseline has been around 1-1.1 recently. Discussed increased oral rehydration at home  and to have a renal function rechecked in one week with her primary care doctor. Strict return and follow-up instructions reviewed. She expressed understanding of all discharge instructions and felt comfortable with the plan of care.   I personally performed the services described in this documentation, which was scribed in my presence. The recorded information has been reviewed and is accurate.   Forde Dandy, MD 08/16/15 1128

## 2015-08-16 NOTE — Discharge Instructions (Signed)
Your kidneys appear dry today. Drink more fluids at home. See PCP in one week for recheck on kidney function. Return for worsening symptoms, including severe chest pain, difficulty breathing, passing out, or unresolving palpitations  Atrial Fibrillation Atrial fibrillation is a type of heartbeat that is irregular or fast (rapid). If you have this condition, your heart keeps quivering in a weird (chaotic) way. This condition can make it so your heart cannot pump blood normally. Having this condition gives a person more risk for stroke, heart failure, and other heart problems. There are different types of atrial fibrillation. Talk with your doctor to learn about the type that you have. HOME CARE  Take over-the-counter and prescription medicines only as told by your doctor.  If your doctor prescribed a blood-thinning medicine, take it exactly as told. Taking too much of it can cause bleeding. If you do not take enough of it, you will not have the protection that you need against stroke and other problems.  Do not use any tobacco products. These include cigarettes, chewing tobacco, and e-cigarettes. If you need help quitting, ask your doctor.  If you have apnea (obstructive sleep apnea), manage it as told by your doctor.  Do not drink alcohol.  Do not drink beverages that have caffeine. These include coffee, soda, and tea.  Maintain a healthy weight. Do not use diet pills unless your doctor says they are safe for you. Diet pills may make heart problems worse.  Follow diet instructions as told by your doctor.  Exercise regularly as told by your doctor.  Keep all follow-up visits as told by your doctor. This is important. GET HELP IF:  You notice a change in the speed, rhythm, or strength of your heartbeat.  You are taking a blood-thinning medicine and you notice more bruising.  You get tired more easily when you move or exercise. GET HELP RIGHT AWAY IF:  You have pain in your chest or  your belly (abdomen).  You have sweating or weakness.  You feel sick to your stomach (nauseous).  You notice blood in your throw up (vomit), poop (stool), or pee (urine).  You are short of breath.  You suddenly have swollen feet and ankles.  You feel dizzy.  Your suddenly get weak or numb in your face, arms, or legs, especially if it happens on one side of your body.  You have trouble talking, trouble understanding, or both.  Your face or your eyelid droops on one side. These symptoms may be an emergency. Do not wait to see if the symptoms will go away. Get medical help right away. Call your local emergency services (911 in the [place] Do not drive yourself to the hospital.   This information is not intended to replace advice given to you by your health care provider. Make sure you discuss any questions you have with your health care provider.   Document Released: 11/08/2007 Document Revised: 10/20/2014 Document Reviewed: 05/26/2014 Elsevier Interactive Patient Education [employer].

## 2015-08-16 NOTE — ED Notes (Signed)
Pt reports tachycardia of 120 this morning at 830 , denies cp, pt felt her heart race and "felt funny".

## 2015-08-17 LAB — CBC WITH DIFFERENTIAL/PLATELET
BASOS ABS: 0 10*3/uL (ref 0.0–0.2)
Basos: 1 %
EOS (ABSOLUTE): 0.1 10*3/uL (ref 0.0–0.4)
Eos: 1 %
Hematocrit: 45.2 % (ref 34.0–46.6)
Hemoglobin: 14.9 g/dL (ref 11.1–15.9)
IMMATURE GRANS (ABS): 0 10*3/uL (ref 0.0–0.1)
IMMATURE GRANULOCYTES: 0 %
LYMPHS: 28 %
Lymphocytes Absolute: 1.6 10*3/uL (ref 0.7–3.1)
MCH: 29.7 pg (ref 26.6–33.0)
MCHC: 33 g/dL (ref 31.5–35.7)
MCV: 90 fL (ref 79–97)
Monocytes Absolute: 0.5 10*3/uL (ref 0.1–0.9)
Monocytes: 8 %
NEUTROS PCT: 62 %
Neutrophils Absolute: 3.7 10*3/uL (ref 1.4–7.0)
PLATELETS: 239 10*3/uL (ref 150–379)
RBC: 5.01 x10E6/uL (ref 3.77–5.28)
RDW: 13.8 % (ref 12.3–15.4)
WBC: 5.9 10*3/uL (ref 3.4–10.8)

## 2015-08-17 LAB — BMP8+EGFR
BUN/Creatinine Ratio: 16 (ref 12–28)
BUN: 18 mg/dL (ref 8–27)
CALCIUM: 9.1 mg/dL (ref 8.7–10.3)
CO2: 25 mmol/L (ref 18–29)
Chloride: 99 mmol/L (ref 96–106)
Creatinine, Ser: 1.14 mg/dL — ABNORMAL HIGH (ref 0.57–1.00)
GFR, EST AFRICAN AMERICAN: 57 mL/min/{1.73_m2} — AB (ref >59)
GFR, EST NON AFRICAN AMERICAN: 50 mL/min/{1.73_m2} — AB (ref >59)
Glucose: 99 mg/dL (ref 65–99)
Potassium: 4.3 mmol/L (ref 3.5–5.2)
Sodium: 141 mmol/L (ref 134–144)

## 2015-08-17 LAB — NMR, LIPOPROFILE

## 2015-08-17 LAB — HEPATIC FUNCTION PANEL
ALT: 33 IU/L — ABNORMAL HIGH (ref 0–32)
AST: 29 IU/L (ref 0–40)
Albumin: 3.7 g/dL (ref 3.6–4.8)
Alkaline Phosphatase: 99 IU/L (ref 39–117)
BILIRUBIN TOTAL: 0.6 mg/dL (ref 0.0–1.2)
BILIRUBIN, DIRECT: 0.16 mg/dL (ref 0.00–0.40)
TOTAL PROTEIN: 6.5 g/dL (ref 6.0–8.5)

## 2015-08-17 LAB — VITAMIN D 25 HYDROXY (VIT D DEFICIENCY, FRACTURES): VIT D 25 HYDROXY: 23.1 ng/mL — AB (ref 30.0–100.0)

## 2015-08-18 LAB — LIPID PANEL
CHOLESTEROL TOTAL: 162 mg/dL (ref 100–199)
Chol/HDL Ratio: 4.3 ratio units (ref 0.0–4.4)
HDL: 38 mg/dL — ABNORMAL LOW (ref >39)
LDL Calculated: 105 mg/dL — ABNORMAL HIGH (ref 0–99)
Triglycerides: 96 mg/dL (ref 0–149)
VLDL CHOLESTEROL CAL: 19 mg/dL (ref 5–40)

## 2015-08-18 LAB — SPECIMEN STATUS REPORT

## 2015-09-06 ENCOUNTER — Telehealth: Payer: Self-pay | Admitting: Cardiology

## 2015-09-06 NOTE — Telephone Encounter (Signed)
Patient called and left message to change appointment with Dr. Percival Spanish.  Called back, no answer LVM

## 2015-09-06 NOTE — Telephone Encounter (Signed)
F/u Message ° °Pt returning RN call. Please call back to discuss  °

## 2015-09-07 NOTE — Telephone Encounter (Signed)
Per notes - pt called into office to left a message to reschedule her [place] office appt.  Please call her back to reschedule.

## 2015-10-05 ENCOUNTER — Emergency Department (HOSPITAL_COMMUNITY)
Admission: EM | Admit: 2015-10-05 | Discharge: 2015-10-05 | Disposition: A | Discharge status: Home / Self Care | Payer: Commercial Managed Care - HMO | Attending: Emergency Medicine | Admitting: Emergency Medicine

## 2015-10-05 ENCOUNTER — Encounter (HOSPITAL_COMMUNITY): Payer: Self-pay | Admitting: Emergency Medicine

## 2015-10-05 ENCOUNTER — Emergency Department (HOSPITAL_COMMUNITY): Payer: Commercial Managed Care - HMO

## 2015-10-05 DIAGNOSIS — Z79899 Other long term (current) drug therapy: Secondary | ICD-10-CM | POA: Insufficient documentation

## 2015-10-05 DIAGNOSIS — R002 Palpitations: Secondary | ICD-10-CM | POA: Insufficient documentation

## 2015-10-05 DIAGNOSIS — I129 Hypertensive chronic kidney disease with stage 1 through stage 4 chronic kidney disease, or unspecified chronic kidney disease: Secondary | ICD-10-CM | POA: Insufficient documentation

## 2015-10-05 DIAGNOSIS — N183 Chronic kidney disease, stage 3 (moderate): Secondary | ICD-10-CM | POA: Diagnosis not present

## 2015-10-05 DIAGNOSIS — Z7982 Long term (current) use of aspirin: Secondary | ICD-10-CM | POA: Diagnosis not present

## 2015-10-05 LAB — BASIC METABOLIC PANEL
Anion gap: 8 (ref 5–15)
BUN: 15 mg/dL (ref 6–20)
CO2: 28 mmol/L (ref 22–32)
Calcium: 9.1 mg/dL (ref 8.9–10.3)
Chloride: 101 mmol/L (ref 101–111)
Creatinine, Ser: 1.24 mg/dL — ABNORMAL HIGH (ref 0.44–1.00)
GFR calc Af Amer: 51 mL/min — ABNORMAL LOW (ref >60)
GFR calc non Af Amer: 44 mL/min — ABNORMAL LOW (ref >60)
Glucose, Bld: 141 mg/dL — ABNORMAL HIGH (ref 65–99)
Potassium: 4 mmol/L (ref 3.5–5.1)
Sodium: 137 mmol/L (ref 135–145)

## 2015-10-05 LAB — CBC
HEMATOCRIT: 42.8 % (ref 36.0–46.0)
Hemoglobin: 14.8 g/dL (ref 12.0–15.0)
MCH: 30 pg (ref 26.0–34.0)
MCHC: 34.6 g/dL (ref 30.0–36.0)
MCV: 86.8 fL (ref 78.0–100.0)
Platelets: 230 10*3/uL (ref 150–400)
RBC: 4.93 MIL/uL (ref 3.87–5.11)
RDW: 12.8 % (ref 11.5–15.5)
WBC: 8 10*3/uL (ref 4.0–10.5)

## 2015-10-05 LAB — TROPONIN I: Troponin I: <0.03 ng/mL (ref <0.03)

## 2015-10-05 NOTE — ED Provider Notes (Signed)
[facility] DEPT Provider Note   CSN: [id number] Arrival date & time: 10/05/15  1500     History   Chief Complaint Chief Complaint  Patient presents with  . Palpitations    HPI Alison Garrett is a 70 y.o. female.  Patient with a history of atrial fibrillation. It is very intermittent but has been occurring more frequently. Patient with onset of that at 2:00's afternoon. Continued so at 2:30 this afternoon she took one of her diltiazem pills. Symptoms resolved palpitations resolved rapid heart rate resolved. Shortness of breath that was associated with resolved. Patient never had any chest pain. Several of these symptoms have been brought on by epigastric abdominal pain that occurred at 12 noon today. This is been a long-term problem has been followed up and worked up extensively. Felt to be related to reflux disease and hiatal hernia.  Patient is followed by Dr. Archie Patten from cardiology. Patient is not on blood thinners. Patient's atrial fibrillation will some time resolve itself spontaneously usually less than 20 minutes. Since it lasted longer today she took one of her calcium channel blockers. Following the taking of that all symptoms resolved.      Past Medical History:  Diagnosis Date  . Afib ([place])   . Dyslipidemia   . Essential tremor 01/12/2014  . Fibromyalgia    Not currently under active treatment  . GERD (gastroesophageal reflux disease)   . Hiatal hernia   . Hypertension   . Obesity     Patient Active Problem List   Diagnosis Date Noted  . Paresthesias 06/07/2015  . Chronic kidney disease, stage 3 11/17/2014  . Atrial fibrillation with RVR ([place]) 11/16/2014  . Multiple drug allergies 08/11/2014  . Fatigue 01/12/2014  . Essential tremor 01/12/2014  . Metabolic syndrome [id number]  . Hyperlipemia 09/17/2012  . Vitamin D deficiency 09/17/2012  . Fibromyalgia 09/17/2012  . Atrial fibrillation ([place]) 07/11/2011  . OBESITY, UNSPECIFIED 12/01/2008  .  HYPERTENSION, BENIGN 12/01/2008  . ABNORMAL ELECTROCARDIOGRAM 12/01/2008    Past Surgical History:  Procedure Laterality Date  . ABDOMINAL HYSTERECTOMY      OB History    No data available       Home Medications    Prior to Admission medications   Medication Sig Start Date End Date Taking? Authorizing Provider  aspirin EC 81 MG tablet Take 81 mg by mouth daily.   Yes Historical Provider, MD  cholecalciferol (VITAMIN D) 1000 units tablet Take 1,000 Units by mouth daily.   Yes Historical Provider, MD  hydrochlorothiazide (HYDRODIURIL) 25 MG tablet Take 12.5 mg by mouth daily. 05/23/15  Yes Historical Provider, MD    Family History Family History  Problem Relation Age of Onset  . Coronary artery disease Mother   . Heart disease Mother     CABG  . Cancer Mother   . COPD Father   . Hepatitis Sister     autoimune  . Diabetes Sister     Social History Social History  Substance Use Topics  . Smoking status: Never Smoker  . Smokeless tobacco: Never Used  . Alcohol use No     Allergies   Contrast media [iodinated diagnostic agents]; Penicillins; Sulfonamide derivatives; Benicar [olmesartan]; Celebrex [celecoxib]; Cymbalta [duloxetine hcl]; Latex; Neurontin [gabapentin]; Norvasc [amlodipine besylate]; Prozac [fluoxetine hcl]; Xanax [alprazolam]; Zoloft [sertraline hcl]; and Zyprexa [olanzapine]   Review of Systems Review of Systems  Constitutional: Negative for fever.  HENT: Negative for congestion.   Eyes: Negative for visual disturbance.  Respiratory: Positive for  shortness of breath.   Cardiovascular: Positive for palpitations. Negative for chest pain and leg swelling.  Gastrointestinal: Positive for abdominal pain.  Genitourinary: Negative for dysuria.  Musculoskeletal: Negative for back pain.  Skin: Negative for rash and wound.  Neurological: Negative for syncope and headaches.  Hematological: Does not bruise/bleed easily.  Psychiatric/Behavioral: Negative for  confusion.     Physical Exam Updated Vital Signs BP 119/84   Pulse 74   Temp 98.1 F (36.7 C) (Oral)   Resp 13   Ht 5\' 9"  (1.753 m)   Wt 95.3 kg   SpO2 94%   BMI 31.01 kg/m   Physical Exam  Constitutional: She is oriented to person, place, and time. She appears well-developed and well-nourished.  HENT:  Head: Normocephalic and atraumatic.  Eyes: EOM are normal. Pupils are equal, round, and reactive to light.  Neck: Normal range of motion. Neck supple.  Cardiovascular: Normal rate, regular rhythm and normal heart sounds.   Pulmonary/Chest: Effort normal and breath sounds normal. No respiratory distress. She has no wheezes.  Abdominal: Soft. Bowel sounds are normal. There is no tenderness.  Musculoskeletal: Normal range of motion. She exhibits no edema.  Neurological: She is alert and oriented to person, place, and time. No cranial nerve deficit. She exhibits normal muscle tone. Coordination normal.  Skin: Skin is warm. No erythema.  Nursing note and vitals reviewed.    ED Treatments / Results  Labs (all labs ordered are listed, but only abnormal results are displayed) Labs Reviewed  BASIC METABOLIC PANEL - Abnormal; Notable for the following:       Result Value   Glucose, Bld 141 (*)    Creatinine, Ser 1.24 (*)    GFR calc non Af Amer 44 (*)    GFR calc Af Amer 51 (*)    All other components within normal limits  CBC  TROPONIN I    EKG  EKG Interpretation None      ED ECG REPORT   Date: 10/05/2015  Rate: 86  Rhythm: normal sinus rhythm  QRS Axis: normal  Intervals: normal  ST/T Wave abnormalities: nonspecific ST/T changes  Conduction Disutrbances:none  Narrative Interpretation:   Old EKG Reviewed: none available  I have personally reviewed the EKG tracing and agree with the computerized printout as noted.   Radiology Dg Chest 2 View  Result Date: 10/05/2015 CLINICAL DATA:  Palpitations and atrial fibrillation. EXAM: CHEST  2 VIEW COMPARISON:   08/16/2015 FINDINGS: The heart size and mediastinal contours are within normal limits. There is no evidence of pulmonary edema, consolidation, pneumothorax, nodule or pleural fluid. The visualized skeletal structures are unremarkable. IMPRESSION: No active cardiopulmonary disease. Electronically Signed   By: Aletta Edouard M.D.   On: 10/05/2015 15:32    Procedures Procedures (including critical care time)  Medications Ordered in ED Medications - No data to display   Initial Impression / Assessment and Plan / ED Course  I have reviewed the triage vital signs and the nursing notes.  Pertinent labs & imaging results that were available during my care of the patient were reviewed by me and considered in my medical decision making (see chart for details).  Clinical Course    Patient with known history of atrial fibrillation. Patient had onset of palpitations. The palpitations reminded her of her atrial fibrillation. It started at 2:00's afternoon. Patient at about 2:30 to occur cardoazem. And also symptoms resolved. Associated with some slight shortness of breath is resolved. Frequently when she gets her onset  of atrial fibrillation here of late as been related to epigastric abdominal pain that started at noon time. That's a separate problem does not always lead to the atrial fibrillation. That's been worked up extensively without any cystic findings. Patient takes Nexium for that she is known to have a history of hiatal hernia and gastroesophageal reflux disease. Both symptoms have improved the palpitations have improved completely. Patient is followed by cardiology she is instructed that if the palpitations do not get under control to take her cardiazem.  -EKG did not crossover into the system from triage.  Chest x-ray negative.  Blood sugar mildly elevated. Follow-up would be important. No history of diabetes.    EKG and cardiac monitoring here showed a normal sinus rhythm. Labs without  any significant abnormalities.  Final Clinical Impressions(s) / ED Diagnoses   Final diagnoses:  Heart palpitations    New Prescriptions New Prescriptions   No medications on file      Fredia Sorrow, MD 10/05/15 1737

## 2015-10-05 NOTE — ED Notes (Signed)
Pt taken to Xr.  

## 2015-10-05 NOTE — Discharge Instructions (Signed)
Return for any newer worse the rapid heart rates that the diltiazem does not control. Make an appointment to follow-up with your cardiologist.

## 2015-10-05 NOTE — ED Notes (Signed)
Pt made aware to return if symptoms worsen or if any life threatening symptoms occur.   

## 2015-10-05 NOTE — ED Triage Notes (Signed)
Pt reports palpitations and abdominal pain. Pt has hx of afib. States she took a Cardizem tablet PTA. VS WDL.

## 2015-10-07 ENCOUNTER — Ambulatory Visit (INDEPENDENT_AMBULATORY_CARE_PROVIDER_SITE_OTHER): Payer: Commercial Managed Care - HMO | Admitting: Cardiology

## 2015-10-07 ENCOUNTER — Encounter: Payer: Self-pay | Admitting: Cardiology

## 2015-10-07 VITALS — BP 142/86 | HR 83 | Ht 69.0 in | Wt 222.0 lb

## 2015-10-07 DIAGNOSIS — R5382 Chronic fatigue, unspecified: Secondary | ICD-10-CM

## 2015-10-07 DIAGNOSIS — I4891 Unspecified atrial fibrillation: Secondary | ICD-10-CM | POA: Diagnosis not present

## 2015-10-07 LAB — TSH: TSH: 1.56 m[IU]/L

## 2015-10-07 MED ORDER — DILTIAZEM HCL 30 MG PO TABS: 30.0000 mg | ORAL_TABLET | Freq: Two times a day (BID) | ORAL | 9 refills | Status: DC

## 2015-10-07 NOTE — Patient Instructions (Signed)
Medication Instructions:  TAKE Cardizem 30 mg twice a day, take 1 tablets in the morning and another 8 hours later  Labwork: TSH Today  Testing/Procedures: None Ordered  Follow-Up: Your physician recommends that you schedule a follow-up appointment in:  2 Months in [place]   Any Other Special Instructions Will Be Listed Below (If Applicable).   If you need a refill on your cardiac medications before your next appointment, please call your pharmacy.

## 2015-10-07 NOTE — Progress Notes (Signed)
HPI The patient presents for evaluation of atrial fibrillation.  She was in the hospital in October of last year. She had atrial fibrillation at home. She was transported to the emergency room. She was cardioverted successfully to sinus rhythm.  In April she was in the hospital ED after having leg pain.  The etiology of this was not clear.  They mention palpitations in those notes but she denies this.  She was in the ED three times this year including 3 days ago.  She had palpitations that persisted after taking by mouth Cardizem. He been having more of these recently. She did go back into sinus rhythm before being seen in the emergency room. However, she's having symptoms of the palpitations. He made very anxious. She's had increased anxiety overall. She's had lots of GI problems and has noticed discomfort with certain foods. She's not had any presyncope or syncope. She's not describing chest pressure, neck or arm discomfort. Said no new shortness of breath, PND or orthopnea.  Of note she was added to my schedule because of this recent ED visit.   Of note she stopped taking her Eliquis because she thought it was causing side effects like a headache.  She had agreed to restart this but again causing headaches but she is willing to try.   Allergies  Allergen Reactions  . Contrast Media [Iodinated Diagnostic Agents] Anaphylaxis  . Penicillins Anaphylaxis  . Sulfonamide Derivatives Hives  . Benicar [Olmesartan] Rash  . Celebrex [Celecoxib] Anxiety  . Cymbalta [Duloxetine Hcl] Itching  . Latex Rash  . Neurontin [Gabapentin] Nausea Only  . Norvasc [Amlodipine Besylate] Swelling  . Prozac [Fluoxetine Hcl] Other (See Comments)  . Xanax [Alprazolam] Other (See Comments)  . Zoloft [Sertraline Hcl] Itching  . Zyprexa [Olanzapine] Other (See Comments)    Wt gain    Current Outpatient Prescriptions  Medication Sig Dispense Refill  . aspirin EC 81 MG tablet Take 81 mg by mouth daily.    .  cholecalciferol (VITAMIN D) 1000 units tablet Take 1,000 Units by mouth daily.    . hydrochlorothiazide (HYDRODIURIL) 25 MG tablet Take 12.5 mg by mouth daily.    Marland Kitchen diltiazem (CARDIZEM) 30 MG tablet Take 1 tablet (30 mg total) by mouth 2 (two) times daily. 60 tablet 9   No current facility-administered medications for this visit.     Past Medical History:  Diagnosis Date  . Afib ([place])   . Dyslipidemia   . Essential tremor 01/12/2014  . Fibromyalgia    Not currently under active treatment  . GERD (gastroesophageal reflux disease)   . Hiatal hernia   . Hypertension   . Obesity     Past Surgical History:  Procedure Laterality Date  . ABDOMINAL HYSTERECTOMY      ROS:  As stated in the HPI and negative for all other systems.  PHYSICAL EXAM BP (!) 142/86   Pulse 83   Ht 5\' 9"  (1.753 m)   Wt 222 lb (100.7 kg)   BMI 32.78 kg/m  GENERAL:  Well appearing NECK:  No jugular venous distention, waveform within normal limits, carotid upstroke brisk and symmetric, no bruits, no thyromegaly LUNGS:  Clear to auscultation bilaterally BACK:  No CVA tenderness CHEST:  Unremarkable HEART:  PMI not displaced or sustained,S1 and S2 within normal limits, no S3, no S4, no clicks, no rubs, no murmurs ABD:  Flat, positive bowel sounds normal in frequency in pitch, no bruits, no rebound, no guarding, no midline pulsatile mass, no  hepatomegaly, no splenomegaly EXT:  2 plus pulses throughout, no edema, no cyanosis no clubbing  Lab Results  Component Value Date   TSH 0.599 11/16/2014      ASSESSMENT AND PLAN  ATRIAL FIB:    Ms. JALEEZA SHIMP has a CHA2DS2 - VASc score of 3 with a risk of stroke of 3.2%.  She will remain on Eliquis.  She agrees to give Eliquis another try and to stop the ASA.  Because of her increased palpitations she's going to start taking her Cardizem 30 mg immediate release twice daily. If this doesn't work we might try beta blockers were increased dose of Cardizem. I'm going to  check a TSH. I've asked her to talk with Dr. Laurance Flatten about her GI complaints and anxiety.  We talked about increasing her physical activity to try to help with all of her symptoms.  She agrees to try the Eliquis again it causes headaches will need to switch to another anticoagulant.  HTN:  Her blood pressure is mildly elevated but this is unusual.  She will follow this at home.

## 2015-10-10 ENCOUNTER — Telehealth: Payer: Self-pay | Admitting: Cardiology

## 2015-10-10 NOTE — Telephone Encounter (Signed)
Pt was put on Diltiazem on Friday. She says she is not going to be able to take them,having side effects.Please call to advise.

## 2015-10-10 NOTE — Telephone Encounter (Signed)
Returned call to patient.She stated she cannot take Diltiazem.Stated she took 30 mg this past Friday and made her feel bad no energy.Today after morning dose she had no energy,achy,tongue sore.Advised I will send message to Dr.Hochrein.

## 2015-10-11 MED ORDER — METOPROLOL TARTRATE 25 MG PO TABS: 12.5000 mg | ORAL_TABLET | Freq: Three times a day (TID) | ORAL | 3 refills | Status: DC

## 2015-10-11 NOTE — Telephone Encounter (Signed)
Leave message for pt to call back 

## 2015-10-11 NOTE — Telephone Encounter (Signed)
Metoprolol 12.5 mg Q8H send into pt pharmacy, advised pt to take as needed for palpitation.

## 2015-10-11 NOTE — Telephone Encounter (Signed)
F/u message ° °Pt returning RN call. Please call back to discuss  °

## 2015-10-11 NOTE — Telephone Encounter (Signed)
Tell her to stop the Cardizem and she can start Metoprolol 12.5 mg prn q8 hours palpitations.  Let her know that this is short acting and very low dose and should help with the palpitations but not cause fatigue.

## 2015-10-13 ENCOUNTER — Telehealth: Payer: Self-pay | Admitting: Cardiology

## 2015-10-13 NOTE — Telephone Encounter (Signed)
F/u message ° °Pt returning RN call about lab results. Please call back to discuss  °

## 2015-10-18 NOTE — Telephone Encounter (Signed)
Notes Recorded by Shellia Cleverly, RN on 10/13/2015 at 5:01 PM EDT Left message for pt that TSH is normal and requested she call back if questions or concerns

## 2015-11-02 ENCOUNTER — Ambulatory Visit: Payer: Commercial Managed Care - HMO | Admitting: Cardiology

## 2015-11-16 ENCOUNTER — Other Ambulatory Visit: Payer: Self-pay | Admitting: Family Medicine

## 2015-11-24 ENCOUNTER — Encounter: Payer: Self-pay | Admitting: *Deleted

## 2015-12-06 NOTE — Progress Notes (Signed)
   HPI The patient presents for evaluation of atrial fibrillation.  Since I last saw her she has had increased palpitations.  Cardizem was stopped and she was started on metoprolol.  Since I saw her she is no longer having palpitations. She does have chronic dyspnea with exertion which is stable and unchanged from previous. She's not currently taking Eliquis because it caused headaches. She stopped taking the Cardizem because it caused multiple symptoms.   Allergies  Allergen Reactions  . Contrast Media [Iodinated Diagnostic Agents] Anaphylaxis  . Penicillins Anaphylaxis  . Sulfonamide Derivatives Hives  . Benicar [Olmesartan] Rash  . Celebrex [Celecoxib] Anxiety  . Cymbalta [Duloxetine Hcl] Itching  . Latex Rash  . Neurontin [Gabapentin] Nausea Only  . Norvasc [Amlodipine Besylate] Swelling  . Prozac [Fluoxetine Hcl] Other (See Comments)  . Xanax [Alprazolam] Other (See Comments)  . Zoloft [Sertraline Hcl] Itching  . Zyprexa [Olanzapine] Other (See Comments)    Wt gain    Current Outpatient Prescriptions  Medication Sig Dispense Refill  . aspirin EC 81 MG tablet Take 81 mg by mouth daily.    . hydrochlorothiazide (HYDRODIURIL) 25 MG tablet Take 12.5 mg by mouth daily.    . metoprolol tartrate (LOPRESSOR) 25 MG tablet Take 12.5 mg by mouth 2 (two) times daily as needed.      No current facility-administered medications for this visit.     Past Medical History:  Diagnosis Date  . Afib ([place])   . Dyslipidemia   . Essential tremor 01/12/2014  . Fibromyalgia    Not currently under active treatment  . GERD (gastroesophageal reflux disease)   . Hiatal hernia   . Hypertension   . Obesity     Past Surgical History:  Procedure Laterality Date  . ABDOMINAL HYSTERECTOMY      ROS:   As stated in the HPI and negative for all other systems.  PHYSICAL EXAM BP (!) 148/104   Pulse 83   Ht 5\' 10"  (1.778 m)   Wt 223 lb (101.2 kg)   BMI 32.00 kg/m  GENERAL:  Well appearing NECK:   No jugular venous distention, waveform within normal limits, carotid upstroke brisk and symmetric, no bruits, no thyromegaly LUNGS:  Clear to auscultation bilaterally BACK:  No CVA tenderness CHEST:  Unremarkable HEART:  PMI not displaced or sustained,S1 and S2 within normal limits, no S3, no S4, no clicks, no rubs, no murmurs ABD:  Flat, positive bowel sounds normal in frequency in pitch, no bruits, no rebound, no guarding, no midline pulsatile mass, no hepatomegaly, no splenomegaly EXT:  2 plus pulses throughout, no edema, no cyanosis no clubbing  Lab Results  Component Value Date   TSH 1.56 10/07/2015      ASSESSMENT AND PLAN  ATRIAL FIB:    Ms. CHENELLE ELFERS has a CHA2DS2 - VASc score of 3 with a risk of stroke of 3.2%.  We have had multiple discussions about this.  She was going to try Elquis again but did not.  She is willing to give Eliquis another try.  She understands that I cannot change her stroke risk and that ASA is not effective.    HTN:  Her blood pressure is mildly elevated but this is unusual.  She will follow this at home.    PALPITATIONS:  These are no longer problematic. No change in therapy is indicated.

## 2015-12-07 ENCOUNTER — Encounter: Payer: Self-pay | Admitting: Cardiology

## 2015-12-07 ENCOUNTER — Ambulatory Visit (INDEPENDENT_AMBULATORY_CARE_PROVIDER_SITE_OTHER): Payer: Commercial Managed Care - HMO | Admitting: Cardiology

## 2015-12-07 VITALS — BP 148/104 | HR 83 | Ht 70.0 in | Wt 223.0 lb

## 2015-12-07 DIAGNOSIS — I4891 Unspecified atrial fibrillation: Secondary | ICD-10-CM | POA: Diagnosis not present

## 2015-12-07 NOTE — Patient Instructions (Signed)
Medication Instructions:  The current medical regimen is effective;  continue present plan and medications.  Follow-Up: Follow up in 6 months with Dr. Hochrein.  You will receive a letter in the mail 2 months before you are due.  Please call us when you receive this letter to schedule your follow up appointment.  If you need a refill on your cardiac medications before your next appointment, please call your pharmacy.  Thank you for choosing [facility]!!       

## 2015-12-21 ENCOUNTER — Ambulatory Visit: Payer: Commercial Managed Care - HMO | Admitting: Family Medicine

## 2016-01-18 ENCOUNTER — Encounter: Payer: Self-pay | Admitting: Family Medicine

## 2016-01-18 ENCOUNTER — Ambulatory Visit (INDEPENDENT_AMBULATORY_CARE_PROVIDER_SITE_OTHER): Payer: Commercial Managed Care - HMO | Admitting: Family Medicine

## 2016-01-18 VITALS — BP 126/82 | HR 72 | Temp 97.3°F | Ht 70.0 in | Wt 223.0 lb

## 2016-01-18 DIAGNOSIS — E559 Vitamin D deficiency, unspecified: Secondary | ICD-10-CM | POA: Diagnosis not present

## 2016-01-18 DIAGNOSIS — M797 Fibromyalgia: Secondary | ICD-10-CM

## 2016-01-18 DIAGNOSIS — Z889 Allergy status to unspecified drugs, medicaments and biological substances status: Secondary | ICD-10-CM | POA: Diagnosis not present

## 2016-01-18 DIAGNOSIS — E7800 Pure hypercholesterolemia, unspecified: Secondary | ICD-10-CM | POA: Diagnosis not present

## 2016-01-18 DIAGNOSIS — K219 Gastro-esophageal reflux disease without esophagitis: Secondary | ICD-10-CM

## 2016-01-18 DIAGNOSIS — I480 Paroxysmal atrial fibrillation: Secondary | ICD-10-CM

## 2016-01-18 NOTE — Progress Notes (Signed)
Subjective:    Patient ID: Alison Garrett, female    DOB: [birth year], 70 y.o.   MRN: [id number]  HPI Pt here for follow up and management of chronic medical problems which includes hypertension. She is taking medication regularly.The patient is doing well overall with no specific complaints. She's been to the ER on 2 occasions with shortness of breath and palpitations. She followed up with Dr. Orlean Patten and in October and he encouraged her to go on Eliquis which I'm not sure that she is done at this time. She is encouraged to do this. She will be given an FOBT to return and will return to the office for fasting lab work. She is requesting a refill on her HCTZ. The patient denies any chest pain pressure palpitations or shortness of breath since this summer and her visit to the ER. She reemphasized to me again why she does not want to take the Eliquis. This is mainly because it made her walk into walls and do crazy things that she says that she doesn't know why she was doing that. She took it for 3 weeks when this was happening. She discussed a baby aspirin. She denies any trouble with her stomach nausea vomiting diarrhea or blood in the stool. She is passing her water without problems. She says her blood pressures at home run anywhere from 125-130/75-80.     Patient Active Problem List   Diagnosis Date Noted  . Paresthesias 06/07/2015  . Chronic kidney disease, stage 3 11/17/2014  . Atrial fibrillation with RVR ([place]) 11/16/2014  . Multiple drug allergies 08/11/2014  . Fatigue 01/12/2014  . Essential tremor 01/12/2014  . Metabolic syndrome 14/48/1856  . Hyperlipemia 09/17/2012  . Vitamin D deficiency 09/17/2012  . Fibromyalgia 09/17/2012  . Atrial fibrillation ([place]) 07/11/2011  . OBESITY, UNSPECIFIED 12/01/2008  . HYPERTENSION, BENIGN 12/01/2008  . ABNORMAL ELECTROCARDIOGRAM 12/01/2008   Outpatient Encounter Prescriptions as of 01/18/2016  Medication Sig  . aspirin EC 81 MG tablet Take 81 mg  by mouth daily.  . hydrochlorothiazide (HYDRODIURIL) 25 MG tablet Take 12.5 mg by mouth daily.  . [DISCONTINUED] metoprolol tartrate (LOPRESSOR) 25 MG tablet Take 12.5 mg by mouth 2 (two) times daily as needed.    No facility-administered encounter medications on file as of 01/18/2016.      Review of Systems  Constitutional: Negative.   HENT: Negative.   Eyes: Negative.   Respiratory: Negative.   Cardiovascular: Negative.   Gastrointestinal: Negative.   Endocrine: Negative.   Genitourinary: Negative.   Musculoskeletal: Negative.   Skin: Negative.   Allergic/Immunologic: Negative.   Neurological: Negative.   Hematological: Negative.   Psychiatric/Behavioral: Negative.        Objective:   Physical Exam  Constitutional: She is oriented to person, place, and time. She appears well-developed and well-nourished. No distress.  The patient is extremely anxious about taking any medicines as she has had multiple reactions to multiple medications and even anesthesia. She is uptight about this.  HENT:  Right Ear: External ear normal.  Left Ear: External ear normal.  Nose: Nose normal.  Mouth/Throat: Oropharynx is clear and moist.  Eyes: Conjunctivae and EOM are normal. Pupils are equal, round, and reactive to light. Right eye exhibits no discharge. Left eye exhibits no discharge. No scleral icterus.  Neck: Normal range of motion. Neck supple. No JVD present. No thyromegaly present.  Cardiovascular: Normal rate, regular rhythm, normal heart sounds and intact distal pulses.   No murmur heard. Heart is regular  at 84/m  Pulmonary/Chest: Effort normal and breath sounds normal. No respiratory distress. She has no wheezes. She has no rales.  Clear anteriorly and posteriorly  Abdominal: Soft. Bowel sounds are normal. She exhibits no mass. There is no tenderness. There is no rebound and no guarding.  Abdominal obesity without masses tenderness or organ enlargement  Musculoskeletal: Normal range  of motion. She exhibits no edema.  Lymphadenopathy:    She has no cervical adenopathy.  Neurological: She is alert and oriented to person, place, and time. She has normal reflexes. No cranial nerve deficit.  Skin: Skin is warm and dry. No rash noted.  Psychiatric: She has a normal mood and affect. Her behavior is normal. Judgment and thought content normal.  Nursing note and vitals reviewed.  BP (!) 142/88 (BP Location: Left Arm)   Pulse 72   Temp 97.3 F (36.3 C) (Oral)   Ht _0  (1.778 m)   Wt 223 lb (101.2 kg)   BMI 32.00 kg/m         Assessment & Plan:  1. Vitamin D deficiency -Continue current treatment pending results of lab work - CBC with Differential/Platelet; Future - VITAMIN D 25 Hydroxy (Vit-D Deficiency, Fractures); Future  2. Gastroesophageal reflux disease without esophagitis -She is doing well with this today with no complaints of reflux or acid indigestion. - CBC with Differential/Platelet; Future - Hepatic function panel; Future  3. Pure hypercholesterolemia -Continue with aggressive therapeutic lifestyle changes - BMP8+EGFR; Future - CBC with Differential/Platelet; Future - Hepatic function panel; Future - Lipid panel; Future  4. Paroxysmal atrial fibrillation (HCC) -Continue follow-up with cardiology - BMP8+EGFR; Future - CBC with Differential/Platelet; Future - Hepatic function panel; Future  5. Fibromyalgia -Continue current treatment - CBC with Differential/Platelet; Future  6. Multiple drug allergies -The patient is reluctant to take any new medication because of multiple allergies to multiple meds. She is intolerant to Cardizem and Eliquis. -She does not want to do a colonoscopy because she is concerned about being put to sleep with anesthesia. -She will return an FOBT.  No orders of the defined types were placed in this encounter.  Patient Instructions                       Medicare Annual Wellness Visit  [facility] and the  medical providers at [facility] strive to bring you the best medical care.  In doing so we not only want to address your current medical conditions and concerns but also to detect new conditions early and prevent illness, disease and health-related problems.    Medicare offers a yearly Wellness Visit which allows our clinical staff to assess your need for preventative services including immunizations, lifestyle education, counseling to decrease risk of preventable diseases and screening for fall risk and other medical concerns.    This visit is provided free of charge (no copay) for all Medicare recipients. The clinical pharmacists at [facility] have begun to conduct these Wellness Visits which will also include a thorough review of all your medications.    As you primary medical provider recommend that you make an appointment for your Annual Wellness Visit if you have not done so already this year.  You may set up this appointment before you leave today or you may call back ([phone]) and schedule an appointment.  Please make sure when you call that you mention that you are scheduling your Annual Wellness Visit with the clinical pharmacist  so that the appointment may be made for the proper length of time.     Continue current medications. Continue good therapeutic lifestyle changes which include good diet and exercise. Fall precautions discussed with patient. If an FOBT was given today- please return it to our front desk. If you are over 84 years old - you may need Prevnar 15 or the adult Pneumonia vaccine.  **Flu shots are available--- please call and schedule a FLU-CLINIC appointment**  After your visit with [place] today you will receive a survey in the mail or online from [employer] regarding your care with [place]. Please take a moment to fill this out. Your feedback is very important to [place] as you can help [place] better understand your patient needs as well  as improve your experience and satisfaction. WE CARE ABOUT YOU!!!   Continue to follow-up with cardiology as planned Take every effort to lose weight through exercise and diet  Arrie Senate MD

## 2016-01-18 NOTE — Patient Instructions (Addendum)
Medicare Annual Wellness Visit  [facility] and the medical providers at [facility] strive to bring you the best medical care.  In doing so we not only want to address your current medical conditions and concerns but also to detect new conditions early and prevent illness, disease and health-related problems.    Medicare offers a yearly Wellness Visit which allows our clinical staff to assess your need for preventative services including immunizations, lifestyle education, counseling to decrease risk of preventable diseases and screening for fall risk and other medical concerns.    This visit is provided free of charge (no copay) for all Medicare recipients. The clinical pharmacists at [facility] have begun to conduct these Wellness Visits which will also include a thorough review of all your medications.    As you primary medical provider recommend that you make an appointment for your Annual Wellness Visit if you have not done so already this year.  You may set up this appointment before you leave today or you may call back [phone] and schedule an appointment.  Please make sure when you call that you mention that you are scheduling your Annual Wellness Visit with the clinical pharmacist so that the appointment may be made for the proper length of time.     Continue current medications. Continue good therapeutic lifestyle changes which include good diet and exercise. Fall precautions discussed with patient. If an FOBT was given today- please return it to our front desk. If you are over 69 years old - you may need Prevnar 49 or the adult Pneumonia vaccine.  **Flu shots are available--- please call and schedule a FLU-CLINIC appointment**  After your visit with [place] today you will receive a survey in the mail or online from [employer] regarding your care with [place]. Please take a moment to fill this out. Your feedback is very  important to [place] as you can help [place] better understand your patient needs as well as improve your experience and satisfaction. WE CARE ABOUT YOU!!!   Continue to follow-up with cardiology as planned Take every effort to lose weight through exercise and diet

## 2016-01-20 ENCOUNTER — Other Ambulatory Visit: Payer: Commercial Managed Care - HMO

## 2016-01-20 DIAGNOSIS — E7800 Pure hypercholesterolemia, unspecified: Secondary | ICD-10-CM | POA: Diagnosis not present

## 2016-01-20 DIAGNOSIS — K219 Gastro-esophageal reflux disease without esophagitis: Secondary | ICD-10-CM

## 2016-01-20 DIAGNOSIS — M797 Fibromyalgia: Secondary | ICD-10-CM | POA: Diagnosis not present

## 2016-01-20 DIAGNOSIS — E559 Vitamin D deficiency, unspecified: Secondary | ICD-10-CM

## 2016-01-20 DIAGNOSIS — I480 Paroxysmal atrial fibrillation: Secondary | ICD-10-CM | POA: Diagnosis not present

## 2016-01-21 LAB — BMP8+EGFR
BUN/Creatinine Ratio: 18 (ref 12–28)
BUN: 16 mg/dL (ref 8–27)
CALCIUM: 8.9 mg/dL (ref 8.7–10.3)
CHLORIDE: 104 mmol/L (ref 96–106)
CO2: 26 mmol/L (ref 18–29)
Creatinine, Ser: 0.89 mg/dL (ref 0.57–1.00)
GFR calc Af Amer: 76 mL/min/{1.73_m2} (ref >59)
GFR calc non Af Amer: 66 mL/min/{1.73_m2} (ref >59)
GLUCOSE: 103 mg/dL — AB (ref 65–99)
Potassium: 4.4 mmol/L (ref 3.5–5.2)
Sodium: 144 mmol/L (ref 134–144)

## 2016-01-21 LAB — LIPID PANEL
CHOLESTEROL TOTAL: 163 mg/dL (ref 100–199)
Chol/HDL Ratio: 4.3 ratio units (ref 0.0–4.4)
HDL: 38 mg/dL — AB (ref >39)
LDL CALC: 101 mg/dL — AB (ref 0–99)
Triglycerides: 120 mg/dL (ref 0–149)
VLDL CHOLESTEROL CAL: 24 mg/dL (ref 5–40)

## 2016-01-21 LAB — HEPATIC FUNCTION PANEL
ALBUMIN: 4 g/dL (ref 3.6–4.8)
ALK PHOS: 88 IU/L (ref 39–117)
ALT: 30 IU/L (ref 0–32)
AST: 25 IU/L (ref 0–40)
BILIRUBIN, DIRECT: 0.14 mg/dL (ref 0.00–0.40)
Bilirubin Total: 0.6 mg/dL (ref 0.0–1.2)
TOTAL PROTEIN: 6.7 g/dL (ref 6.0–8.5)

## 2016-01-21 LAB — CBC WITH DIFFERENTIAL/PLATELET
BASOS ABS: 0 10*3/uL (ref 0.0–0.2)
Basos: 1 %
EOS (ABSOLUTE): 0.1 10*3/uL (ref 0.0–0.4)
Eos: 2 %
HEMOGLOBIN: 14.8 g/dL (ref 11.1–15.9)
Hematocrit: 42.8 % (ref 34.0–46.6)
IMMATURE GRANS (ABS): 0 10*3/uL (ref 0.0–0.1)
Immature Granulocytes: 0 %
LYMPHS: 27 %
Lymphocytes Absolute: 1.6 10*3/uL (ref 0.7–3.1)
MCH: 29.2 pg (ref 26.6–33.0)
MCHC: 34.6 g/dL (ref 31.5–35.7)
MCV: 85 fL (ref 79–97)
MONOCYTES: 8 %
Monocytes Absolute: 0.5 10*3/uL (ref 0.1–0.9)
Neutrophils Absolute: 3.6 10*3/uL (ref 1.4–7.0)
Neutrophils: 62 %
PLATELETS: 221 10*3/uL (ref 150–379)
RBC: 5.06 x10E6/uL (ref 3.77–5.28)
RDW: 13.8 % (ref 12.3–15.4)
WBC: 5.7 10*3/uL (ref 3.4–10.8)

## 2016-01-21 LAB — VITAMIN D 25 HYDROXY (VIT D DEFICIENCY, FRACTURES): Vit D, 25-Hydroxy: 21.8 ng/mL — ABNORMAL LOW (ref 30.0–100.0)

## 2016-01-23 ENCOUNTER — Telehealth: Payer: Self-pay | Admitting: Family Medicine

## 2016-01-25 NOTE — Telephone Encounter (Signed)
[email or web address]

## 2016-01-26 NOTE — Telephone Encounter (Signed)
Pt aware.

## 2016-02-07 ENCOUNTER — Other Ambulatory Visit: Payer: Self-pay | Admitting: *Deleted

## 2016-02-07 MED ORDER — VITAMIN D (ERGOCALCIFEROL) 1.25 MG (50000 UNIT) PO CAPS: 50000.0000 [IU] | ORAL_CAPSULE | ORAL | 2 refills | Status: DC

## 2016-05-16 ENCOUNTER — Other Ambulatory Visit: Payer: Self-pay | Admitting: Family Medicine

## 2016-05-23 ENCOUNTER — Ambulatory Visit (INDEPENDENT_AMBULATORY_CARE_PROVIDER_SITE_OTHER): Payer: Medicare HMO | Admitting: Family Medicine

## 2016-05-23 ENCOUNTER — Ambulatory Visit (INDEPENDENT_AMBULATORY_CARE_PROVIDER_SITE_OTHER): Payer: Medicare HMO

## 2016-05-23 ENCOUNTER — Encounter: Payer: Self-pay | Admitting: Family Medicine

## 2016-05-23 VITALS — BP 142/90 | HR 76 | Temp 96.8°F | Ht 70.0 in | Wt 220.0 lb

## 2016-05-23 DIAGNOSIS — Z889 Allergy status to unspecified drugs, medicaments and biological substances status: Secondary | ICD-10-CM | POA: Diagnosis not present

## 2016-05-23 DIAGNOSIS — E7800 Pure hypercholesterolemia, unspecified: Secondary | ICD-10-CM

## 2016-05-23 DIAGNOSIS — I10 Essential (primary) hypertension: Secondary | ICD-10-CM | POA: Diagnosis not present

## 2016-05-23 DIAGNOSIS — K219 Gastro-esophageal reflux disease without esophagitis: Secondary | ICD-10-CM | POA: Diagnosis not present

## 2016-05-23 DIAGNOSIS — Z13820 Encounter for screening for osteoporosis: Secondary | ICD-10-CM

## 2016-05-23 DIAGNOSIS — I480 Paroxysmal atrial fibrillation: Secondary | ICD-10-CM | POA: Diagnosis not present

## 2016-05-23 DIAGNOSIS — E559 Vitamin D deficiency, unspecified: Secondary | ICD-10-CM

## 2016-05-23 DIAGNOSIS — Z780 Asymptomatic menopausal state: Secondary | ICD-10-CM

## 2016-05-23 DIAGNOSIS — M797 Fibromyalgia: Secondary | ICD-10-CM

## 2016-05-23 NOTE — Patient Instructions (Addendum)
Medicare Annual Wellness Visit  [facility] and the medical providers at [facility] strive to bring you the best medical care.  In doing so we not only want to address your current medical conditions and concerns but also to detect new conditions early and prevent illness, disease and health-related problems.    Medicare offers a yearly Wellness Visit which allows our clinical staff to assess your need for preventative services including immunizations, lifestyle education, counseling to decrease risk of preventable diseases and screening for fall risk and other medical concerns.    This visit is provided free of charge (no copay) for all Medicare recipients. The clinical pharmacists at [facility] have begun to conduct these Wellness Visits which will also include a thorough review of all your medications.    As you primary medical provider recommend that you make an appointment for your Annual Wellness Visit if you have not done so already this year.  You may set up this appointment before you leave today or you may call back [phone]) and schedule an appointment.  Please make sure when you call that you mention that you are scheduling your Annual Wellness Visit with the clinical pharmacist so that the appointment may be made for the proper length of time.     Continue current medications. Continue good therapeutic lifestyle changes which include good diet and exercise. Fall precautions discussed with patient. If an FOBT was given today- please return it to our front desk. If you are over 44 years old - you may need Prevnar 41 or the adult Pneumonia vaccine.  **Flu shots are available--- please call and schedule a FLU-CLINIC appointment**  After your visit with [place] today you will receive a survey in the mail or online from [employer] regarding your care with [place]. Please take a moment to fill this out. Your feedback is very  important to [place] as you can help [place] better understand your patient needs as well as improve your experience and satisfaction. WE CARE ABOUT YOU!!!   Continue to follow-up with cardiology Continue to at least take the baby aspirin Return the FOBT Avoid caffeine as much as possible Drink plenty of water and fluids and stay well hydrated

## 2016-05-23 NOTE — Progress Notes (Signed)
Subjective:    Patient ID: Alison Garrett, female    DOB: December 11, 1945, 71 y.o.   MRN: [id number]  HPI Pt here for follow up and management of chronic medical problems which includes gerd, hyperlipidemia, a fib, and hypertension. She is taking medication regularly.This patient has had several episodes of atrial fib. She is currently taking a baby aspirin once a day and continues taking her vitamin D and hydrochlorothiazide. She is due to get a mammogram and a DEXA scan. She will be given an FOBT to return and will get her routine lab work today. Her last colonoscopy was 2005. She also has an eye appointment next week. The patient denies any chest pain or shortness of breath. She denies any recurrent palpitations since last fall. She does see the cardiologist about every 6 months. She is only taking a baby aspirin for the arrhythmia and does not tolerate any of the new novel agents. She denies any problems with her intestinal tract including nausea vomiting diarrhea blood in the stool or black tarry bowel movements. There is no family history of colon cancer. She does not want to have another colonoscopy. She will return an FOBT and understands the importance of monitoring this. She is passing her water without problems other than just frequency. She still working at [employer]. She says that her blood pressures at home run in the 130s over the 80s. She plans to schedule her mammogram.    Patient Active Problem List   Diagnosis Date Noted  . Paresthesias 06/07/2015  . Chronic kidney disease, stage 3 11/17/2014  . Atrial fibrillation with RVR ([place]) 11/16/2014  . Multiple drug allergies 08/11/2014  . Fatigue 01/12/2014  . Essential tremor 01/12/2014  . Metabolic syndrome 37/54/3606  . Hyperlipemia 09/17/2012  . Vitamin D deficiency 09/17/2012  . Fibromyalgia 09/17/2012  . Atrial fibrillation ([place]) 07/11/2011  . OBESITY, UNSPECIFIED 12/01/2008  . HYPERTENSION, BENIGN 12/01/2008  . ABNORMAL  ELECTROCARDIOGRAM 12/01/2008   Outpatient Encounter Prescriptions as of 05/23/2016  Medication Sig  . aspirin EC 81 MG tablet Take 81 mg by mouth daily.  . hydrochlorothiazide (HYDRODIURIL) 25 MG tablet Take 12.5 mg by mouth daily.  . [DISCONTINUED] hydrochlorothiazide (HYDRODIURIL) 25 MG tablet TAKE ONE TABLET BY MOUTH ONCE DAILY  . [DISCONTINUED] Vitamin D, Ergocalciferol, (DRISDOL) 50000 units CAPS capsule Take 1 capsule (50,000 Units total) by mouth every 7 (seven) days.   No facility-administered encounter medications on file as of 05/23/2016.       Review of Systems  Constitutional: Negative.   HENT: Negative.   Eyes: Negative.   Respiratory: Negative.   Cardiovascular: Negative.   Gastrointestinal: Negative.   Endocrine: Negative.   Genitourinary: Negative.   Musculoskeletal: Negative.   Skin: Negative.   Allergic/Immunologic: Negative.   Neurological: Negative.   Hematological: Negative.   Psychiatric/Behavioral: Negative.        Objective:   Physical Exam  Constitutional: She is oriented to person, place, and time. She appears well-developed and well-nourished. No distress.  HENT:  Head: Normocephalic and atraumatic.  Right Ear: External ear normal.  Left Ear: External ear normal.  Mouth/Throat: Oropharynx is clear and moist.  Nasal congestion and turbinate swelling bilaterally  Eyes: Conjunctivae and EOM are normal. Pupils are equal, round, and reactive to light. Right eye exhibits no discharge. Left eye exhibits no discharge. No scleral icterus.  Patient plans to get eye exam next week and we'll make sure we get a copy of the report  Neck: Normal  range of motion. Neck supple. No thyromegaly present.  No bruits thyromegaly or anterior cervical adenopathy  Cardiovascular: Normal rate, regular rhythm, normal heart sounds and intact distal pulses.   No murmur heard. The heart is regular at 84/m  Pulmonary/Chest: Effort normal and breath sounds normal. No  respiratory distress. She has no wheezes. She has no rales.  Clear anteriorly and posteriorly  Abdominal: Soft. Bowel sounds are normal. She exhibits no mass. There is no tenderness. There is no rebound and no guarding.  No abdominal tenderness masses or bruits  Musculoskeletal: Normal range of motion. She exhibits no edema or tenderness.  Lymphadenopathy:    She has no cervical adenopathy.  Neurological: She is alert and oriented to person, place, and time. She has normal reflexes. No cranial nerve deficit.  Skin: Skin is warm and dry. No rash noted.  Psychiatric: She has a normal mood and affect. Her behavior is normal. Judgment and thought content normal.  Nursing note and vitals reviewed.  BP 139/90 (BP Location: Left Arm)   Pulse 76   Temp (!) 96.8 F (36 C) (Oral)   Ht 5' 10"  (1.778 m)   Wt 220 lb (99.8 kg)   BMI 31.57 kg/m         Assessment & Plan:  1. Gastroesophageal reflux disease without esophagitis -The patient is currently doing well this and is not taking any medication for this. - CBC with Differential/Platelet - Hepatic function panel  2. Vitamin D deficiency -Continue vitamin D 50,000 units pending results of lab work - CBC with Differential/Platelet - VITAMIN D 25 Hydroxy (Vit-D Deficiency, Fractures) - DG [facility] DEXA; Future  3. Pure hypercholesterolemia -Continue aggressive therapeutic lifestyle changes - CBC with Differential/Platelet - Lipid panel  4. Paroxysmal atrial fibrillation (HCC) -Continue to follow-up with cardiology and take blood thinner as recommended by him and if not tolerated take baby aspirin as currently doing -Please follow-up with cardiology as planned - CBC with Differential/Platelet  5. HYPERTENSION, BENIGN -Repeat blood pressure by me today remains slightly elevated but patient says her numbers at home are in the 130s over the 80s. No change in treatment. - CBC with Differential/Platelet - BMP8+EGFR - Hepatic function  panel  6. Screening for osteoporosis - DG [facility] DEXA; Future  7. Postmenopausal - DG [facility] DEXA; Future  8. Fibromyalgia -Continue with Tylenol and regular physical activity  9. Multiple drug allergies -The patient is always concerned about her sensitivity to medications and is almost at the point of being phobic about starting any kind of new medicine. She admits to this.  Patient Instructions                       Medicare Annual Wellness Visit  [facility] and the medical providers at [facility] strive to bring you the best medical care.  In doing so we not only want to address your current medical conditions and concerns but also to detect new conditions early and prevent illness, disease and health-related problems.    Medicare offers a yearly Wellness Visit which allows our clinical staff to assess your need for preventative services including immunizations, lifestyle education, counseling to decrease risk of preventable diseases and screening for fall risk and other medical concerns.    This visit is provided free of charge (no copay) for all Medicare recipients. The clinical pharmacists at [facility] have begun to conduct these Wellness Visits which will also include a thorough review  of all your medications.    As you primary medical provider recommend that you make an appointment for your Annual Wellness Visit if you have not done so already this year.  You may set up this appointment before you leave today or you may call back [phone]) and schedule an appointment.  Please make sure when you call that you mention that you are scheduling your Annual Wellness Visit with the clinical pharmacist so that the appointment may be made for the proper length of time.     Continue current medications. Continue good therapeutic lifestyle changes which include good diet and exercise. Fall precautions discussed with patient. If an FOBT was  given today- please return it to our front desk. If you are over 2 years old - you may need Prevnar 76 or the adult Pneumonia vaccine.  **Flu shots are available--- please call and schedule a FLU-CLINIC appointment**  After your visit with [place] today you will receive a survey in the mail or online from [employer] regarding your care with [place]. Please take a moment to fill this out. Your feedback is very important to [place] as you can help [place] better understand your patient needs as well as improve your experience and satisfaction. WE CARE ABOUT YOU!!!   Continue to follow-up with cardiology Continue to at least take the baby aspirin Return the FOBT Avoid caffeine as much as possible Drink plenty of water and fluids and stay well hydrated    Arrie Senate MD

## 2016-05-24 LAB — CBC WITH DIFFERENTIAL/PLATELET
Basophils Absolute: 0 10*3/uL (ref 0.0–0.2)
Basos: 0 %
EOS (ABSOLUTE): 0.1 10*3/uL (ref 0.0–0.4)
Eos: 1 %
HEMATOCRIT: 43.1 % (ref 34.0–46.6)
HEMOGLOBIN: 14.9 g/dL (ref 11.1–15.9)
Immature Grans (Abs): 0 10*3/uL (ref 0.0–0.1)
Immature Granulocytes: 0 %
Lymphocytes Absolute: 2 10*3/uL (ref 0.7–3.1)
Lymphs: 29 %
MCH: 28.9 pg (ref 26.6–33.0)
MCHC: 34.6 g/dL (ref 31.5–35.7)
MCV: 84 fL (ref 79–97)
MONOS ABS: 0.4 10*3/uL (ref 0.1–0.9)
Monocytes: 6 %
Neutrophils Absolute: 4.2 10*3/uL (ref 1.4–7.0)
Neutrophils: 64 %
Platelets: 263 10*3/uL (ref 150–379)
RBC: 5.15 x10E6/uL (ref 3.77–5.28)
RDW: 13.9 % (ref 12.3–15.4)
WBC: 6.7 10*3/uL (ref 3.4–10.8)

## 2016-05-24 LAB — BMP8+EGFR
BUN/Creatinine Ratio: 16 (ref 12–28)
BUN: 15 mg/dL (ref 8–27)
CO2: 25 mmol/L (ref 18–29)
CREATININE: 0.96 mg/dL (ref 0.57–1.00)
Calcium: 9.3 mg/dL (ref 8.7–10.3)
Chloride: 100 mmol/L (ref 96–106)
GFR, EST AFRICAN AMERICAN: 70 mL/min/{1.73_m2} (ref >59)
GFR, EST NON AFRICAN AMERICAN: 61 mL/min/{1.73_m2} (ref >59)
Glucose: 91 mg/dL (ref 65–99)
POTASSIUM: 4.5 mmol/L (ref 3.5–5.2)
SODIUM: 142 mmol/L (ref 134–144)

## 2016-05-24 LAB — HEPATIC FUNCTION PANEL
ALBUMIN: 4 g/dL (ref 3.6–4.8)
ALK PHOS: 93 IU/L (ref 39–117)
ALT: 31 IU/L (ref 0–32)
AST: 29 IU/L (ref 0–40)
BILIRUBIN TOTAL: 0.6 mg/dL (ref 0.0–1.2)
Bilirubin, Direct: 0.16 mg/dL (ref 0.00–0.40)
Total Protein: 7.1 g/dL (ref 6.0–8.5)

## 2016-05-24 LAB — LIPID PANEL
CHOL/HDL RATIO: 4.2 ratio (ref 0.0–4.4)
Cholesterol, Total: 167 mg/dL (ref 100–199)
HDL: 40 mg/dL (ref >39)
LDL CALC: 102 mg/dL — AB (ref 0–99)
TRIGLYCERIDES: 124 mg/dL (ref 0–149)
VLDL Cholesterol Cal: 25 mg/dL (ref 5–40)

## 2016-05-24 LAB — VITAMIN D 25 HYDROXY (VIT D DEFICIENCY, FRACTURES): Vit D, 25-Hydroxy: 23 ng/mL — ABNORMAL LOW (ref 30.0–100.0)

## 2016-05-30 DIAGNOSIS — H5200 Hypermetropia, unspecified eye: Secondary | ICD-10-CM | POA: Diagnosis not present

## 2016-05-30 DIAGNOSIS — H2513 Age-related nuclear cataract, bilateral: Secondary | ICD-10-CM | POA: Diagnosis not present

## 2016-05-30 DIAGNOSIS — Z0100 Encounter for examination of eyes and vision without abnormal findings: Secondary | ICD-10-CM | POA: Diagnosis not present

## 2016-05-30 DIAGNOSIS — I10 Essential (primary) hypertension: Secondary | ICD-10-CM | POA: Diagnosis not present

## 2016-06-06 DIAGNOSIS — Z1231 Encounter for screening mammogram for malignant neoplasm of breast: Secondary | ICD-10-CM | POA: Diagnosis not present

## 2016-07-01 ENCOUNTER — Encounter (HOSPITAL_COMMUNITY): Payer: Self-pay | Admitting: Emergency Medicine

## 2016-07-01 ENCOUNTER — Observation Stay (HOSPITAL_COMMUNITY)
Admission: EM | Admit: 2016-07-01 | Discharge: 2016-07-02 | Disposition: A | Discharge status: Home / Self Care | Payer: Medicare HMO | Attending: Emergency Medicine | Admitting: Emergency Medicine

## 2016-07-01 ENCOUNTER — Emergency Department (HOSPITAL_COMMUNITY): Payer: Medicare HMO

## 2016-07-01 DIAGNOSIS — R0789 Other chest pain: Principal | ICD-10-CM | POA: Insufficient documentation

## 2016-07-01 DIAGNOSIS — Z91040 Latex allergy status: Secondary | ICD-10-CM | POA: Insufficient documentation

## 2016-07-01 DIAGNOSIS — I129 Hypertensive chronic kidney disease with stage 1 through stage 4 chronic kidney disease, or unspecified chronic kidney disease: Secondary | ICD-10-CM | POA: Insufficient documentation

## 2016-07-01 DIAGNOSIS — I480 Paroxysmal atrial fibrillation: Secondary | ICD-10-CM | POA: Diagnosis present

## 2016-07-01 DIAGNOSIS — R0602 Shortness of breath: Secondary | ICD-10-CM | POA: Diagnosis not present

## 2016-07-01 DIAGNOSIS — I10 Essential (primary) hypertension: Secondary | ICD-10-CM | POA: Diagnosis present

## 2016-07-01 DIAGNOSIS — Z7982 Long term (current) use of aspirin: Secondary | ICD-10-CM | POA: Diagnosis not present

## 2016-07-01 DIAGNOSIS — G250 Essential tremor: Secondary | ICD-10-CM | POA: Diagnosis present

## 2016-07-01 DIAGNOSIS — I4891 Unspecified atrial fibrillation: Secondary | ICD-10-CM

## 2016-07-01 DIAGNOSIS — R079 Chest pain, unspecified: Secondary | ICD-10-CM | POA: Diagnosis present

## 2016-07-01 DIAGNOSIS — N183 Chronic kidney disease, stage 3 (moderate): Secondary | ICD-10-CM | POA: Insufficient documentation

## 2016-07-01 LAB — BASIC METABOLIC PANEL
ANION GAP: 7 (ref 5–15)
BUN: 19 mg/dL (ref 6–20)
CALCIUM: 9.3 mg/dL (ref 8.9–10.3)
CHLORIDE: 103 mmol/L (ref 101–111)
CO2: 26 mmol/L (ref 22–32)
Creatinine, Ser: 1.16 mg/dL — ABNORMAL HIGH (ref 0.44–1.00)
GFR calc Af Amer: 54 mL/min — ABNORMAL LOW (ref >60)
GFR calc non Af Amer: 47 mL/min — ABNORMAL LOW (ref >60)
GLUCOSE: 123 mg/dL — AB (ref 65–99)
POTASSIUM: 4.3 mmol/L (ref 3.5–5.1)
Sodium: 136 mmol/L (ref 135–145)

## 2016-07-01 LAB — POCT I-STAT TROPONIN I: TROPONIN I, POC: 0 ng/mL (ref 0.00–0.08)

## 2016-07-01 LAB — CBC
HCT: 44.1 % (ref 36.0–46.0)
Hemoglobin: 14.8 g/dL (ref 12.0–15.0)
MCH: 28.9 pg (ref 26.0–34.0)
MCHC: 33.6 g/dL (ref 30.0–36.0)
MCV: 86.1 fL (ref 78.0–100.0)
PLATELETS: 252 10*3/uL (ref 150–400)
RBC: 5.12 MIL/uL — AB (ref 3.87–5.11)
RDW: 12.9 % (ref 11.5–15.5)
WBC: 7.5 10*3/uL (ref 4.0–10.5)

## 2016-07-01 LAB — I-STAT TROPONIN, ED: Troponin i, poc: 0 ng/mL (ref 0.00–0.08)

## 2016-07-01 MED ORDER — ACETAMINOPHEN 325 MG PO TABS: 650.0000 mg | ORAL_TABLET | ORAL | Status: DC | PRN

## 2016-07-01 MED ORDER — ASPIRIN 325 MG PO TABS
325.0000 mg | ORAL_TABLET | Freq: Every day | ORAL | Status: DC
  Administered 2016-07-02: 325 mg via ORAL
  Filled 2016-07-01 (×2): qty 1

## 2016-07-01 MED ORDER — ONDANSETRON HCL 4 MG/2ML IJ SOLN: 4.0000 mg | Freq: Four times a day (QID) | INTRAMUSCULAR | Status: DC | PRN

## 2016-07-01 MED ORDER — GI COCKTAIL ~~LOC~~
30.0000 mL | Freq: Four times a day (QID) | ORAL | Status: DC | PRN
Start: 2016-07-01 — End: 2016-07-02

## 2016-07-01 MED ORDER — ENOXAPARIN SODIUM 40 MG/0.4ML ~~LOC~~ SOLN
40.0000 mg | SUBCUTANEOUS | Status: DC
  Filled 2016-07-01: qty 0.4

## 2016-07-01 MED ORDER — METOPROLOL TARTRATE 25 MG PO TABS
25.0000 mg | ORAL_TABLET | Freq: Two times a day (BID) | ORAL | Status: DC
  Administered 2016-07-01 – 2016-07-02 (×2): 25 mg via ORAL
  Filled 2016-07-01 (×2): qty 1

## 2016-07-01 MED ORDER — HYDROCHLOROTHIAZIDE 25 MG PO TABS
12.5000 mg | ORAL_TABLET | Freq: Every day | ORAL | Status: DC
  Administered 2016-07-02: 12.5 mg via ORAL
  Filled 2016-07-01 (×2): qty 1

## 2016-07-01 MED ORDER — MORPHINE SULFATE (PF) 4 MG/ML IV SOLN: 2.0000 mg | INTRAVENOUS | Status: DC | PRN

## 2016-07-01 MED ORDER — ZOLPIDEM TARTRATE 5 MG PO TABS
5.0000 mg | ORAL_TABLET | Freq: Every evening | ORAL | Status: DC | PRN
Start: 2016-07-01 — End: 2016-07-02

## 2016-07-01 NOTE — ED Triage Notes (Signed)
Pt c/o center chest pressure and heaviness onset yesterday, while walking to bathroom, with shortness of breath and nausea.

## 2016-07-01 NOTE — ED Provider Notes (Signed)
[facility] DEPT Provider Note   CSN: [id number] Arrival date & time: 07/01/16  1437     History   Chief Complaint Chief Complaint  Patient presents with  . Chest Pain    HPI SERIN THORNELL is a 71 y.o. female PMHx of HTN, obesity, GERD, Afib presents today with comlaints of chest "heaviness" and tightness at 11am today. She reports this lasted about 10 minutes while she was outside"feeding the chickens" and felt better after she sat down. She states she had another episode today while she was at her grandson's baseball game outside around 2pm where she felt the chest heaviness and shortness of breath until she got to ED. She denies any chest discomfort or SOB at this time. She denies taking anything for her symptoms. She reports having similar symptoms in the past. She states she often gets chest heaviness and SOB and weakness with activity and improves with rest and has worsened in the last 2 weeks. She denies any radiation of pain, numbness or tingling, diaphoresis during events. She denies chest pain, SOB at this time. She denies diaphoresis. She denies abdominal pain, n/v/d. She denies back pain. She denies hx of DVT or PE. She denies unilateral swelling, hx of CA, estrogen or hormone use. She denies blood thinner use, besides baby aspirin daily. She reports taking her BP as prescribed. She denies history of hyperlipidemia, smoking, and sedentary lifestyle, history of diabetes. She reports being treated for hypertension, and admits to a family history of heart disease.  Last echo done 07/18/2011 with LV EF 65-70%. Normal systolic function.   The history is provided by the patient. No language interpreter was used.  Chest Pain   Associated symptoms include shortness of breath. Pertinent negatives include no abdominal pain, no diaphoresis, no fever and no vomiting.    Past Medical History:  Diagnosis Date  . Afib ([place])   . Dyslipidemia   . Essential tremor 01/12/2014  . Fibromyalgia     Not currently under active treatment  . GERD (gastroesophageal reflux disease)   . Hiatal hernia   . Hypertension   . Obesity     Patient Active Problem List   Diagnosis Date Noted  . Chest pain 07/01/2016  . Paresthesias 06/07/2015  . Chronic kidney disease, stage 3 11/17/2014  . Atrial fibrillation with RVR ([place]) 11/16/2014  . Multiple drug allergies 08/11/2014  . Fatigue 01/12/2014  . Essential tremor 01/12/2014  . Metabolic syndrome 78/93/8101  . Hyperlipemia 09/17/2012  . Vitamin D deficiency 09/17/2012  . Fibromyalgia 09/17/2012  . Atrial fibrillation ([place]) 07/11/2011  . OBESITY, UNSPECIFIED 12/01/2008  . HYPERTENSION, BENIGN 12/01/2008  . ABNORMAL ELECTROCARDIOGRAM 12/01/2008    Past Surgical History:  Procedure Laterality Date  . ABDOMINAL HYSTERECTOMY      OB History    No data available       Home Medications    Prior to Admission medications   Medication Sig Start Date End Date Taking? Authorizing Provider  aspirin EC 81 MG tablet Take 81 mg by mouth daily.   Yes [provider]  hydrochlorothiazide (HYDRODIURIL) 25 MG tablet Take 12.5 mg by mouth daily. 05/23/15  Yes [provider]    Family History Family History  Problem Relation Age of Onset  . Coronary artery disease Mother   . Heart disease Mother        CABG  . Cancer Mother   . COPD Father   . Hepatitis Sister  autoimune  . Diabetes Sister     Social History Social History  Substance Use Topics  . Smoking status: Never Smoker  . Smokeless tobacco: Never Used  . Alcohol use No     Allergies   Contrast media [iodinated diagnostic agents]; Diltiazem; Penicillins; Sulfonamide derivatives; Benicar [olmesartan]; Celebrex [celecoxib]; Cymbalta [duloxetine hcl]; Latex; Neurontin [gabapentin]; Norvasc [amlodipine besylate]; Prozac [fluoxetine hcl]; Xanax [alprazolam]; Zoloft [sertraline hcl]; and Zyprexa [olanzapine]   Review of Systems Review of Systems    Constitutional: Negative for chills, diaphoresis and fever.  Respiratory: Positive for shortness of breath.   Cardiovascular: Positive for chest pain.  Gastrointestinal: Negative for abdominal pain, diarrhea and vomiting.  Skin: Negative for wound.  All other systems reviewed and are negative.    Physical Exam Updated Vital Signs BP 137/76 (BP Location: Right Arm)   Pulse 74   Temp 97.8 F (36.6 C) (Oral)   Resp 18   Ht 5\' 10"  (1.778 m)   Wt 219 lb 6.4 oz (99.5 kg)   SpO2 100%   BMI 31.48 kg/m   Physical Exam  Constitutional: She appears well-developed and well-nourished. No distress.  Well appearing. Non-ill-appearing  HENT:  Head: Normocephalic and atraumatic.  Nose: Nose normal.  Mouth/Throat: Oropharynx is clear and moist.  Eyes: Conjunctivae and EOM are normal.  Neck: Normal range of motion. No JVD present.  No carotid bruits noted.  Cardiovascular: Normal rate, normal heart sounds and intact distal pulses.  Exam reveals no gallop and no friction rub.   No murmur heard. Pulmonary/Chest: Effort normal and breath sounds normal. No stridor. No respiratory distress. She has no wheezes. She has no rales.  Normal work of breathing. No respiratory distress noted.   Abdominal: Soft. Bowel sounds are normal. She exhibits no mass. There is no tenderness. There is no rebound and no guarding.  Soft and nontender. No focal tenderness at McBurney's point. Negative Murphy sign. No CVA tenderness.  Musculoskeletal: Normal range of motion.  Neurological: She is alert.  Skin: Skin is warm. Capillary refill takes less than 2 seconds.  Psychiatric: She has a normal mood and affect. Her behavior is normal.  Nursing note and vitals reviewed.    ED Treatments / Results  Labs (all labs ordered are listed, but only abnormal results are displayed) Labs Reviewed  BASIC METABOLIC PANEL - Abnormal; Notable for the following:       Result Value   Glucose, Bld 123 (*)    Creatinine, Ser  1.16 (*)    GFR calc non Af Amer 47 (*)    GFR calc Af Amer 54 (*)    All other components within normal limits  CBC - Abnormal; Notable for the following:    RBC 5.12 (*)    All other components within normal limits  I-STAT TROPOININ, ED  POCT I-STAT TROPONIN I    EKG  EKG Interpretation  Date/Time:  Sunday Jul 01 2016 14:44:02 EDT Ventricular Rate:  86 PR Interval:  174 QRS Duration: 86 QT Interval:  376 QTC Calculation: 449 R Axis:   70 Text Interpretation:  Normal sinus rhythm Nonspecific ST abnormality Abnormal ECG No significant change since last tracing Confirmed by Isla Pence [phone]) on 07/01/2016 4:37:51 PM       Radiology Dg Chest 2 View  Result Date: 07/01/2016 CLINICAL DATA:  Shortness of breath, chest tightness EXAM: CHEST  2 VIEW COMPARISON:  10/05/2015 FINDINGS: Heart and mediastinal contours are within normal limits. No focal opacities or effusions. No acute  bony abnormality. IMPRESSION: No active cardiopulmonary disease. Electronically Signed   By: Rolm Baptise M.D.   On: 07/01/2016 15:15    Procedures Procedures (including critical care time)  Medications Ordered in ED Medications  aspirin tablet 325 mg (0 mg Oral Hold 07/01/16 1730)  hydrochlorothiazide (HYDRODIURIL) tablet 12.5 mg (12.5 mg Oral Refused 07/01/16 1729)  acetaminophen (TYLENOL) tablet 650 mg (not administered)  ondansetron (ZOFRAN) injection 4 mg (not administered)  enoxaparin (LOVENOX) injection 40 mg (not administered)  morphine 4 MG/ML injection 2 mg (not administered)  gi cocktail (Maalox,Lidocaine,Donnatal) (not administered)  metoprolol tartrate (LOPRESSOR) tablet 25 mg (not administered)  zolpidem (AMBIEN) tablet 5 mg (not administered)     Initial Impression / Assessment and Plan / ED Course  I have reviewed the triage vital signs and the nursing notes.  Pertinent labs & imaging results that were available during my care of the patient were reviewed by me and considered  in my medical decision making (see chart for details).     Patient with worsening dyspnea for the last 2 weeks and with worsened symptoms today. She currently does not have any chest pain, shortness of breath, or any other symptoms. She is afebrile, hemoglobin is stable, in no apparent distress. Abdomen was soft and nontender. Lab work is reassuring. Initial troponin is negative. EKGs without any acute findings.  Pt also seen and evaluated by Dr. Gilford Raid who agrees with assessment and plan.   I spoke with Dr. Evangeline Gula who agreed to have patient admitted for ACS rule out.   Final Clinical Impressions(s) / ED Diagnoses   Final diagnoses:  Chest pain, unspecified type    New Prescriptions Current Discharge Medication List       Bettey Costa, [place] 07/01/16 1906    Isla Pence, MD 07/01/16 Lurena Nida

## 2016-07-01 NOTE — H&P (Signed)
History and Physical    Alison Garrett:[id number] DOB: 05-17-1945 DOA: 07/01/2016  PCP: Chipper Herb, MD Patient coming from: home was at baseball field  I have personally briefly reviewed patient's old medical records in [place]   Chief Complaint: Substernal chest tightness which began at 11 AM today  HPI: Alison Garrett is a 71 y.o. female with medical history significant of HTN, obesity, GERD, and Afib presents today with comlaints of chest "heaviness" and tightness at 11am today. She reports this lasted about 10 minutes while she was outside feeding the chickens and felt better after she sat down. She states she had another episode today while she was at her grandson's baseball game outside around 2pm where she felt the chest heaviness and shortness of breath with associated diaphoresis. It lasted until she got to ED. The second episode was much more severe and her daughter stated that her face turned red and she had a feeling of impending doom. She denies any chest discomfort or SOB at this time. She denies taking anything for her symptoms. She reports having similar symptoms in the past. She states she often gets chest heaviness and SOB and weakness with activity and improves with rest and has worsened in the last 2 weeks. She denies any radiation of pain, numbness or tingling, diaphoresis during events. She denies chest pain, SOB at this time. She had no radiation to her neck her back her throat or her left arm or stomach. She denies diaphoresis now but it was present earlier. She denies abdominal pain, n/v/d. She denies back pain. She denies hx of DVT or PE. She denies unilateral swelling, hx of CA, estrogen or hormone use. She denies blood thinner use, besides baby aspirin daily. She reports taking her BP as prescribed. She denies history of hyperlipidemia, smoking, and sedentary lifestyle, history of diabetes. She reports being treated for hypertension, and admits to a family  history of heart disease (her mother had a myocardial infarction in her early 74s and coronary artery bypass graft surgery in her early 22s). Last echo done 07/18/2011 with LV EF 65-70%. Normal systolic function.   The history is provided by the patient. No language interpreter was used.       ED Course: Chest pain resolved, laboratory data unremarkable, EKG shows sinus rhythm, troponin is negative. Patient referred to medicine for admission.  Review of Systems:  Review of Systems  Constitutional: Negative for chills and fever.  HENT: Negative for congestion and sinus pain.   Eyes: Negative for blurred vision and double vision.  Respiratory: Positive for shortness of breath. Negative for cough, hemoptysis, sputum production and wheezing.   Cardiovascular: Positive for chest pain. Negative for palpitations, orthopnea, claudication and leg swelling.  Gastrointestinal: Negative for abdominal pain, diarrhea, heartburn, nausea and vomiting.  Genitourinary: Negative for dysuria, frequency and urgency.  Musculoskeletal: Negative for back pain, myalgias and neck pain.  Skin: Negative for itching and rash.  Neurological: Negative for dizziness, tingling and headaches.  Endo/Heme/Allergies: Negative for environmental allergies. Does not bruise/bleed easily.  Psychiatric/Behavioral: Negative for depression and suicidal ideas.   Past Medical History:  Diagnosis Date  . Afib ([place])   . Dyslipidemia   . Essential tremor 01/12/2014  . Fibromyalgia    Not currently under active treatment  . GERD (gastroesophageal reflux disease)   . Hiatal hernia   . Hypertension   . Obesity     Past Surgical History:  Procedure Laterality Date  . ABDOMINAL  HYSTERECTOMY       reports that she has never smoked. She has never used smokeless tobacco. She reports that she does not drink alcohol or use drugs.  Allergies  Allergen Reactions  . Contrast Media [Iodinated Diagnostic Agents] Anaphylaxis  .  Diltiazem Other (See Comments)    Pt. Had pain, jitters and felt terrible  . Penicillins Anaphylaxis    Has patient had a PCN reaction causing immediate rash, facial/tongue/throat swelling, SOB or lightheadedness with hypotension: yes Has patient had a PCN reaction causing severe rash involving mucus membranes or skin necrosis: yes Has patient had a PCN reaction that required hospitalization }unknown Has patient had a PCN reaction occurring within the last 10 years: no If all of the above answers are "NO", then may proceed with Cephalo  . Sulfonamide Derivatives Hives  . Benicar [Olmesartan] Rash  . Celebrex [Celecoxib] Anxiety  . Cymbalta [Duloxetine Hcl] Itching  . Latex Rash  . Neurontin [Gabapentin] Nausea Only  . Norvasc [Amlodipine Besylate] Swelling  . Prozac [Fluoxetine Hcl] Other (See Comments)  . Xanax [Alprazolam] Other (See Comments)  . Zoloft [Sertraline Hcl] Itching  . Zyprexa [Olanzapine] Other (See Comments)    Wt gain    Family History  Problem Relation Age of Onset  . Coronary artery disease Mother   . Heart disease Mother        CABG  . Cancer Mother   . COPD Father   . Hepatitis Sister        autoimune  . Diabetes Sister    Family history significant for mother having first myocardial infarction in her early 15s and coronary artery bypass graft surgery in her early 71s.  Prior to Admission medications   Medication Sig Start Date End Date Taking? Authorizing Provider  aspirin EC 81 MG tablet Take 81 mg by mouth daily.   Yes [provider]  hydrochlorothiazide (HYDRODIURIL) 25 MG tablet Take 12.5 mg by mouth daily. 05/23/15  Yes [provider]    Physical Exam: Vitals:   07/01/16 1615 07/01/16 1630 07/01/16 1645 07/01/16 1700  BP:    133/87  Pulse: 68 75 78 72  Resp: 15 19 19 12   Temp:      TempSrc:      SpO2: 96% 97% 96% 97%  Weight:      Height:        Constitutional: NAD, calm, comfortable Vitals:   07/01/16 1615 07/01/16  1630 07/01/16 1645 07/01/16 1700  BP:    133/87  Pulse: 68 75 78 72  Resp: 15 19 19 12   Temp:      TempSrc:      SpO2: 96% 97% 96% 97%  Weight:      Height:       Eyes: PERRL, lids and conjunctivae normal ENMT: Mucous membranes are moist. Posterior pharynx clear of any exudate or lesions.Normal dentition.  Neck: normal, supple, no masses, no thyromegaly Respiratory: clear to auscultation bilaterally, no wheezing, no crackles. Normal respiratory effort. No accessory muscle use.  Cardiovascular: Regular rate and rhythm, no murmurs / rubs / gallops. No extremity edema. 2+ pedal pulses. No carotid bruits.  Abdomen: no tenderness, no masses palpated. No hepatosplenomegaly. Bowel sounds positive.  Musculoskeletal: no clubbing / cyanosis. No joint deformity upper and lower extremities. Good ROM, no contractures. Normal muscle tone.  Skin: no rashes, lesions, ulcers. No induration Neurologic: CN 2-12 grossly intact. Sensation intact, DTR normal. Strength 5/5 in all 4.  Psychiatric: Normal judgment and insight. Alert  and oriented x 3. Normal mood.    Labs on Admission: I have personally reviewed following labs and imaging studies  CBC:  Recent Labs Lab 07/01/16 1446  WBC 7.5  HGB 14.8  HCT 44.1  MCV 86.1  PLT 360   Basic Metabolic Panel:  Recent Labs Lab 07/01/16 1446  NA 136  K 4.3  CL 103  CO2 26  GLUCOSE 123*  BUN 19  CREATININE 1.16*  CALCIUM 9.3   GFR: Estimated Creatinine Clearance: 58.3 mL/min (A) (by C-G formula based on SCr of 1.16 mg/dL (H)). Urine analysis:    Component Value Date/Time   COLORURINE STRAW (A) 11/16/2014 2101   APPEARANCEUR CLEAR 11/16/2014 2101   LABSPEC 1.008 11/16/2014 2101   PHURINE 6.5 11/16/2014 2101   GLUCOSEU NEGATIVE 11/16/2014 2101   HGBUR NEGATIVE 11/16/2014 2101   [place] NEGATIVE 11/16/2014 2101   KETONESUR 15 (A) 11/16/2014 2101   PROTEINUR NEGATIVE 11/16/2014 2101   UROBILINOGEN 0.2 11/16/2014 2101   NITRITE NEGATIVE  11/16/2014 2101   LEUKOCYTESUR TRACE (A) 11/16/2014 2101    Radiological Exams on Admission: Dg Chest 2 View  Result Date: 07/01/2016 CLINICAL DATA:  Shortness of breath, chest tightness EXAM: CHEST  2 VIEW COMPARISON:  10/05/2015 FINDINGS: Heart and mediastinal contours are within normal limits. No focal opacities or effusions. No acute bony abnormality. IMPRESSION: No active cardiopulmonary disease. Electronically Signed   By: Rolm Baptise M.D.   On: 07/01/2016 15:15    EKG: Independently reviewed. Normal sinus rhythm with nonspecific ST-T wave abnormalities.  Assessment/Plan Principal Problem:   Chest pain Active Problems:   HYPERTENSION, BENIGN   Atrial fibrillation ([facility])   Essential tremor   1. Chest pain: Since chest pain equivalent is actually a tightness. She has some associated symptoms including diaphoresis and impending sense of doom. She has no radiation. Her heart score is a 4. She will be observed overnight we'll rule out myocardial infarction with serial enzymes and EKGs. If her EKGs and enzymes are acceptable patient will proceed to stress testing in a.m. if enzymes are EKG are abnormal will get cardiology consultation. I do not believe the patient requires anticoagulation at this point. She is chest pain-free.  2. Hypertension benign. Continue home hydrochlorothiazide.  3. Atrial fibrillation seems to have resolved and she is currently in sinus rhythm. She takes no medication for it seems to been paroxysmal in the past.  4. Essential tremor patient with tremor of the head she has been seen by neurology in the past and no further workup was recommended.    DVT prophylaxis: Subcutaneous Lovenox for DVT prophylaxis Code Status: Full code Family Communication: Spoke with patient and her daughter who is present in the room at the time of admission. Disposition Plan: Home in 24-48 hours. Consults called: None Admission status: Observation   Lady Deutscher MD  [facility] Pager [phone]  If 7PM-7AM, please contact night-coverage [email or web address] Password [email or web address]  07/01/2016, 5:30 PM

## 2016-07-02 ENCOUNTER — Observation Stay (HOSPITAL_BASED_OUTPATIENT_CLINIC_OR_DEPARTMENT_OTHER): Payer: Medicare HMO

## 2016-07-02 ENCOUNTER — Telehealth: Payer: Self-pay | Admitting: Family Medicine

## 2016-07-02 DIAGNOSIS — Z91040 Latex allergy status: Secondary | ICD-10-CM | POA: Diagnosis not present

## 2016-07-02 DIAGNOSIS — R079 Chest pain, unspecified: Secondary | ICD-10-CM

## 2016-07-02 DIAGNOSIS — I10 Essential (primary) hypertension: Secondary | ICD-10-CM

## 2016-07-02 DIAGNOSIS — I129 Hypertensive chronic kidney disease with stage 1 through stage 4 chronic kidney disease, or unspecified chronic kidney disease: Secondary | ICD-10-CM | POA: Diagnosis not present

## 2016-07-02 DIAGNOSIS — N183 Chronic kidney disease, stage 3 (moderate): Secondary | ICD-10-CM | POA: Diagnosis not present

## 2016-07-02 DIAGNOSIS — Z7982 Long term (current) use of aspirin: Secondary | ICD-10-CM | POA: Diagnosis not present

## 2016-07-02 DIAGNOSIS — I480 Paroxysmal atrial fibrillation: Secondary | ICD-10-CM | POA: Diagnosis not present

## 2016-07-02 DIAGNOSIS — G250 Essential tremor: Secondary | ICD-10-CM | POA: Diagnosis not present

## 2016-07-02 DIAGNOSIS — R0789 Other chest pain: Secondary | ICD-10-CM | POA: Diagnosis not present

## 2016-07-02 LAB — NM MYOCAR MULTI W/SPECT W/WALL MOTION / EF
CHL CUP RESTING HR STRESS: 64 {beats}/min
CSEPED: 5 min
CSEPEDS: 0 s
CSEPHR: 62 %
MPHR: 151 {beats}/min
Peak HR: 95 {beats}/min

## 2016-07-02 LAB — TROPONIN I: Troponin I: <0.03 ng/mL (ref <0.03)

## 2016-07-02 MED ORDER — METOPROLOL TARTRATE 25 MG PO TABS: 12.5000 mg | ORAL_TABLET | Freq: Two times a day (BID) | ORAL | 0 refills | Status: DC

## 2016-07-02 MED ORDER — REGADENOSON 0.4 MG/5ML IV SOLN
INTRAVENOUS | Status: AC
  Administered 2016-07-02: 0.4 mg via INTRAVENOUS
  Filled 2016-07-02: qty 5

## 2016-07-02 MED ORDER — REGADENOSON 0.4 MG/5ML IV SOLN
0.4000 mg | Freq: Once | INTRAVENOUS | Status: AC
  Administered 2016-07-02: 0.4 mg via INTRAVENOUS
  Filled 2016-07-02: qty 5

## 2016-07-02 MED ORDER — TECHNETIUM TC 99M TETROFOSMIN IV KIT
30.0000 | PACK | Freq: Once | INTRAVENOUS | Status: AC | PRN
  Administered 2016-07-02: 30 via INTRAVENOUS

## 2016-07-02 MED ORDER — TECHNETIUM TC 99M TETROFOSMIN IV KIT
10.0000 | PACK | Freq: Once | INTRAVENOUS | Status: AC | PRN
  Administered 2016-07-02: 10 via INTRAVENOUS

## 2016-07-02 NOTE — Plan of Care (Signed)
Problem: Pain Managment: Goal: General experience of comfort will improve Outcome: Progressing No complaints of pain this shift.   Problem: Activity: Goal: Risk for activity intolerance will decrease Outcome: Progressing Patient tolerating bathroom privileges. No complaints of CP or SOB

## 2016-07-02 NOTE — Discharge Summary (Signed)
Physician Discharge Summary  HOLLYNN GARNO FHL:[id number] DOB: 08/19/1945 DOA: 07/01/2016  PCP: Chipper Herb, MD  Admit date: 07/01/2016 Discharge date: 07/02/2016   Recommendations for Outpatient Follow-Up:   1. Monitor blood pressures   Discharge Diagnosis:   Principal Problem:   Chest pain Active Problems:   HYPERTENSION, BENIGN   Atrial fibrillation (HCC)   Essential tremor   Discharge disposition:  Home.  Discharge Condition: Improved.  Diet recommendation: Low sodium, heart healthy.  Wound care: None.   History of Present Illness:   Alison Garrett is a 71 y.o. female with medical history significant of HTN, obesity, GERD, and Afib presents today with comlaints of chest "heaviness" and tightness at 11am today. She reports this lasted about 10 minutes while she was outside feeding the chickens and felt better after she sat down. She states she had another episode today while she was at her grandson's baseball game outside around 2pm where she felt the chest heaviness and shortness of breath with associated diaphoresis. It lasted until she got to ED. The second episode was much more severe and her daughter stated that her face turned red and she had a feeling of impending doom. She denies any chest discomfort or SOB at this time. She denies taking anything for her symptoms. She reports having similar symptoms in the past. She states she often gets chest heaviness and SOB and weakness with activity and improves with rest and has worsened in the last 2 weeks. She denies any radiation of pain, numbness or tingling, diaphoresis during events. She denies chest pain, SOB at this time. She had no radiation to her neck her back her throat or her left arm or stomach. She denies diaphoresis now but it was present earlier. She denies abdominal pain, n/v/d. She denies back pain. She denies hx of DVT or PE. She denies unilateral swelling, hx of CA, estrogen or hormone use. She denies blood  thinner use, besides baby aspirin daily. She reports taking her BP as prescribed. She denies history of hyperlipidemia, smoking, and sedentary lifestyle,history of diabetes. She reports being treated for hypertension, and admits to a family history of heart disease (her mother had a myocardial infarction in her early 79s and coronary artery bypass graft surgery in her early 47s). Last echo done 07/18/2011 with LV EF 65-70%. Normal systolic function.   The history is provided by the patient. No language interpreter was used.      Hospital Course by Problem:   Chest pain:  CE negative -nuclear stress test- low risk  Dehydration-- was out in the sun and not drinking water IVF -improved  Hypertension benign -cautious use of HCTZ -BB added   Atrial fibrillation seems to have resolved and she is currently in sinus rhythm. She takes no medication for it seems to been paroxysmal in the past- encouraged NOAC-- defer to cardiology  Essential tremor patient with tremor of the head she has been seen by neurology in the past and no further workup was recommended.    Medical Consultants:    None.   Discharge Exam:   Vitals:   07/02/16 1007 07/02/16 1009  BP: (!) 150/85 132/85  Pulse:    Resp:    Temp:     Vitals:   07/02/16 0940 07/02/16 1005 07/02/16 1007 07/02/16 1009  BP: (!) 145/101 (!) 169/82 (!) 150/85 132/85  Pulse:      Resp:      Temp:      TempSrc:  SpO2:      Weight:      Height:        Gen:  NAD    The results of significant diagnostics from this hospitalization (including imaging, microbiology, ancillary and laboratory) are listed below for reference.     Procedures and Diagnostic Studies:   Dg Chest 2 View  Result Date: 07/01/2016 CLINICAL DATA:  Shortness of breath, chest tightness EXAM: CHEST  2 VIEW COMPARISON:  10/05/2015 FINDINGS: Heart and mediastinal contours are within normal limits. No focal opacities or effusions. No acute bony  abnormality. IMPRESSION: No active cardiopulmonary disease. Electronically Signed   By: Rolm Baptise M.D.   On: 07/01/2016 15:15   Nm Myocar Multi W/spect W/wall Motion / Ef  Result Date: 07/02/2016 CLINICAL DATA:  Chest pain. History of atrial fibrillation, diabetes and hypertension. EXAM: MYOCARDIAL IMAGING WITH SPECT (REST AND PHARMACOLOGIC-STRESS) GATED LEFT VENTRICULAR WALL MOTION STUDY LEFT VENTRICULAR EJECTION FRACTION TECHNIQUE: Standard myocardial SPECT imaging was performed after resting intravenous injection of 10 mCi Tc-54m tetrofosmin. Subsequently, intravenous infusion of Lexiscan was performed under the supervision of the Cardiology staff. At peak effect of the drug, 30 mCi Tc-56m tetrofosmin was injected intravenously and standard myocardial SPECT imaging was performed. Quantitative gated imaging was also performed to evaluate left ventricular wall motion, and estimate left ventricular ejection fraction. COMPARISON:  Chest radiographs 07/01/2016 FINDINGS: Perfusion: No decreased activity in the left ventricle on stress imaging to suggest reversible ischemia or infarction. Mild apical thinning. Wall Motion: Normal left ventricular wall motion. No left ventricular dilation. Left Ventricular Ejection Fraction: 74 % End diastolic volume 82 ml End systolic volume 21 ml IMPRESSION: 1. No reversible ischemia or infarction. 2. Normal left ventricular wall motion. 3. Left ventricular ejection fraction 74% 4. Non invasive risk stratification*: Low *2012 Appropriate Use Criteria for Coronary Revascularization Focused Update: J Am Coll Cardiol. 0321;22(4):825-003. http://content.[email or web address].aspx?articleid=[email or web address] Electronically Signed   By: Richardean Sale M.D.   On: 07/02/2016 14:19     Labs:   Basic Metabolic Panel:  Recent Labs Lab 07/01/16 1446  NA 136  K 4.3  CL 103  CO2 26  GLUCOSE 123*  BUN 19  CREATININE 1.16*  CALCIUM 9.3   GFR Estimated Creatinine Clearance: 58.5  mL/min (A) (by C-G formula based on SCr of 1.16 mg/dL (H)). Liver Function Tests: No results for input(s): AST, ALT, ALKPHOS, BILITOT, PROT, ALBUMIN in the last 168 hours. No results for input(s): LIPASE, AMYLASE in the last 168 hours. No results for input(s): AMMONIA in the last 168 hours. Coagulation profile No results for input(s): INR, PROTIME in the last 168 hours.  CBC:  Recent Labs Lab 07/01/16 1446  WBC 7.5  HGB 14.8  HCT 44.1  MCV 86.1  PLT 252   Cardiac Enzymes:  Recent Labs Lab 07/02/16 0749  TROPONINI <0.03   BNP: Invalid input(s): POCBNP CBG: No results for input(s): GLUCAP in the last 168 hours. D-Dimer No results for input(s): DDIMER in the last 72 hours. Hgb A1c No results for input(s): HGBA1C in the last 72 hours. Lipid Profile No results for input(s): CHOL, HDL, LDLCALC, TRIG, CHOLHDL, LDLDIRECT in the last 72 hours. Thyroid function studies No results for input(s): TSH, T4TOTAL, T3FREE, THYROIDAB in the last 72 hours.  Invalid input(s): FREET3 Anemia work up No results for input(s): VITAMINB12, FOLATE, FERRITIN, TIBC, IRON, RETICCTPCT in the last 72 hours. Microbiology No results found for this or any previous visit (from the past 240 hour(s)).   Discharge Instructions:  Discharge Instructions    Diet - low sodium heart healthy    Complete by:  As directed    Discharge instructions    Complete by:  As directed    Be sure to stay hydrated when outside   Increase activity slowly    Complete by:  As directed      Allergies as of 07/02/2016      Reactions   Contrast Media [iodinated Diagnostic Agents] Anaphylaxis   Diltiazem Other (See Comments)   Pt. Had pain, jitters and felt terrible   Penicillins Anaphylaxis      Sulfonamide Derivatives Hives   Benicar [olmesartan] Rash   Celebrex [celecoxib] Anxiety   Cymbalta [duloxetine Hcl] Itching   Latex Rash   Neurontin [gabapentin] Nausea Only   Norvasc [amlodipine Besylate] Swelling    Prozac [fluoxetine Hcl] Other (See Comments)   Xanax [alprazolam] Other (See Comments)   Zoloft [sertraline Hcl] Itching   Zyprexa [olanzapine] Other (See Comments)   Wt gain      Medication List    TAKE these medications   aspirin EC 81 MG tablet Take 81 mg by mouth daily.   hydrochlorothiazide 25 MG tablet Commonly known as:  HYDRODIURIL Take 12.5 mg by mouth daily.   metoprolol tartrate 25 MG tablet Commonly known as:  LOPRESSOR Take 0.5 tablets (12.5 mg total) by mouth 2 (two) times daily.         Time coordinating discharge: 25 min  Signed:  Wilhemenia Camba U Khloi Rawl   [facility] 07/02/2016, 2:40 PM

## 2016-07-02 NOTE — Consult Note (Signed)
[facility] CM Primary Care Navigator  07/02/2016  Alison Garrett 05/09/1945 [id number]   Met withpatient and son (David) in the roomto identify possible discharge needs. Patient reports having "chest pressure" and difficulty breathing that had led to this admission. [facility].   [facility] [place] [facility] any problem.   Patient states that shemanages her own medications at home straight out of the containers ("only taking a couple").  Patient was driving prior to admission. She states that her children (lives nearby) can providetransportation to her doctors'appointments after discharge as needed.  [employer] transportation benefits discussed with patient as well.  She lives alone and reports that after discharge, her children will be assisting with her care needs at homeif needed.  Anticipated discharge plan is home per patient.  Patient voiced understanding to call primary care provider's office when she returns back home, for a post discharge follow-up appointment within a week or sooner if needs arise.Patient letter (with PCP's contact number) was provided as her reminder.  She denies any health management needs or concerns at this time. [facility] care management contact information provided for future needs that she may have.   For questions, please contact:  Lorraine Ajel, BSN, RN- BC Primary Care Navigator  Telephone: [phone] [facility] 

## 2016-07-05 NOTE — Telephone Encounter (Signed)
Pt has hospital follow up with Dr. Warrick Parisian 07/11/16, will close encounter.

## 2016-07-11 ENCOUNTER — Encounter: Payer: Self-pay | Admitting: Family Medicine

## 2016-07-11 ENCOUNTER — Ambulatory Visit (INDEPENDENT_AMBULATORY_CARE_PROVIDER_SITE_OTHER): Payer: Medicare HMO | Admitting: Family Medicine

## 2016-07-11 VITALS — BP 131/88 | HR 76 | Temp 97.8°F | Ht 70.0 in | Wt 222.0 lb

## 2016-07-11 DIAGNOSIS — R0789 Other chest pain: Secondary | ICD-10-CM

## 2016-07-11 DIAGNOSIS — K2100 Gastro-esophageal reflux disease with esophagitis, without bleeding: Secondary | ICD-10-CM

## 2016-07-11 NOTE — Progress Notes (Signed)
BP 131/88   Pulse 76   Temp 97.8 F (36.6 C) (Oral)   Ht 5\' 10"  (1.778 m)   Wt 222 lb (100.7 kg)   BMI 31.85 kg/m    Subjective:    Patient ID: Alison Garrett, female    DOB: 1945-12-25, 71 y.o.   MRN: [id number]  HPI: Alison Garrett is a 71 y.o. female presenting on 07/11/2016 for Hospital followup   HPI Chest pain/Hospital follow-up Patient is coming in for chest pain/Hospital follow-up. She was in the hospital on 07/01/2016 until 07/02/2016 for chest pressure and elevated blood pressure. She had been feeling a heaviness and tightness in her chest that started that morning and lasts about 10 minutes when she was up and active and returned to normal on rest. She had another episode later that afternoon as well and then went to the emergency department. She denies any fevers or chills or shortness of breath or chest pressure since leaving the hospital. They did an enema perfusion scan there and troponins and EKGs and did not find anything wrong with the heart on those exams. She says she does have a lot of acid reflux and GERD that she is followed over the years and has tried different medications for it in the past and she feels like it may be correlated to that. She does still have a little bit today but has not been having it the same as she was having prior to going to the hospital that day. We discussed dietary and lifestyle changes for GERD and she would like to try those rather than going on to a medication at this time but will return if it is not working.  Relevant past medical, surgical, family and social history reviewed and updated as indicated. Interim medical history since our last visit reviewed. Allergies and medications reviewed and updated.  Review of Systems  Constitutional: Negative for chills and fever.  Respiratory: Negative for chest tightness and shortness of breath.   Cardiovascular: Positive for chest pain. Negative for leg swelling.  Gastrointestinal: Positive  for abdominal pain and nausea. Negative for constipation, diarrhea and vomiting.  Genitourinary: Negative for difficulty urinating and dysuria.  Musculoskeletal: Negative for back pain and gait problem.  Skin: Negative for rash.  Neurological: Negative for light-headedness and headaches.  Psychiatric/Behavioral: Negative for agitation and behavioral problems.  All other systems reviewed and are negative.   Per HPI unless specifically indicated above        Objective:    BP 131/88   Pulse 76   Temp 97.8 F (36.6 C) (Oral)   Ht 5\' 10"  (1.778 m)   Wt 222 lb (100.7 kg)   BMI 31.85 kg/m   Wt Readings from Last 3 Encounters:  07/11/16 222 lb (100.7 kg)  07/02/16 219 lb 3.2 oz (99.4 kg)  05/23/16 220 lb (99.8 kg)    Physical Exam  Constitutional: She is oriented to person, place, and time. She appears well-developed and well-nourished. No distress.  Eyes: Conjunctivae are normal.  Neck: Neck supple. No thyromegaly present.  Cardiovascular: Normal rate, regular rhythm, normal heart sounds and intact distal pulses.   No murmur heard. Pulmonary/Chest: Effort normal and breath sounds normal. No respiratory distress. She has no wheezes. She has no rales. She exhibits no tenderness.  Abdominal: Soft. Bowel sounds are normal. She exhibits no distension. There is tenderness in the epigastric area. There is no rigidity, no rebound, no guarding and no CVA tenderness.  Musculoskeletal: Normal range  of motion. She exhibits no edema or tenderness.  Lymphadenopathy:    She has no cervical adenopathy.  Neurological: She is alert and oriented to person, place, and time. Coordination normal.  Skin: Skin is warm and dry. No rash noted. She is not diaphoretic.  Psychiatric: She has a normal mood and affect. Her behavior is normal.  Nursing note and vitals reviewed.       Assessment & Plan:   Problem List Items Addressed This Visit    None    Visit Diagnoses    Chest discomfort    -   Primary   ED follow-up, it appears that she had perfusion test and everything was normal with a heart, likely GERD related   Gastroesophageal reflux disease with esophagitis       Patient would like to try dietary and lifestyle changes first and then will come back if she wants to consider medication at that time.       Follow up plan: Return if symptoms worsen or fail to improve.  Counseling provided for all of the vaccine components No orders of the defined types were placed in this encounter.   Caryl Pina, MD [facility] 07/11/2016, 10:37 AM

## 2016-09-15 ENCOUNTER — Other Ambulatory Visit: Payer: Self-pay | Admitting: Family Medicine

## 2016-09-24 ENCOUNTER — Encounter: Payer: Self-pay | Admitting: Cardiology

## 2016-10-09 NOTE — Progress Notes (Signed)
HPI The patient presents for evaluation of atrial fibrillation.  Since I last saw her she was in the hospital with chest pain.  I reviewed these records for this visit.   The pain was not found to be cardiac.   She had a negative perfusion study with a normal EF.   Since that time she has done well.  The patient denies any new symptoms such as chest discomfort, neck or arm discomfort. There has been no new shortness of breath, PND or orthopnea. There have been no reported palpitations, presyncope or syncope.  She is active at work.  She never needs to take beta blocker as she has no palpitations.    Allergies  Allergen Reactions  . Contrast Media [Iodinated Diagnostic Agents] Anaphylaxis  . Penicillins Anaphylaxis  . Sulfonamide Derivatives Hives  . Benicar [Olmesartan] Rash  . Celebrex [Celecoxib] Anxiety  . Cymbalta [Duloxetine Hcl] Itching  . Latex Rash  . Neurontin [Gabapentin] Nausea Only  . Norvasc [Amlodipine Besylate] Swelling  . Prozac [Fluoxetine Hcl] Other (See Comments)  . Xanax [Alprazolam] Other (See Comments)  . Zoloft [Sertraline Hcl] Itching  . Zyprexa [Olanzapine] Other (See Comments)    Wt gain    Current Outpatient Prescriptions  Medication Sig Dispense Refill  . hydrochlorothiazide (HYDRODIURIL) 25 MG tablet Take 12.5 mg by mouth daily.    . metoprolol tartrate (LOPRESSOR) 25 MG tablet Take 25 mg by mouth. Take 1/2 tablet by mouth every  8 hours for palpitations as needed     No current facility-administered medications for this visit.     Past Medical History:  Diagnosis Date  . Afib ([place])   . Dyslipidemia   . Essential tremor 01/12/2014  . Fibromyalgia    Not currently under active treatment  . GERD (gastroesophageal reflux disease)   . Hiatal hernia   . Hypertension   . Obesity     Past Surgical History:  Procedure Laterality Date  . ABDOMINAL HYSTERECTOMY      ROS:   As stated in the HPI and negative for all other systems.  PHYSICAL  EXAM BP (!) 132/96   Pulse 70   Ht 5\' 10"  (1.778 m)   Wt 223 lb (101.2 kg)   BMI 32.00 kg/m   GENERAL:  Well appearing NECK:  No jugular venous distention, waveform within normal limits, carotid upstroke brisk and symmetric, no bruits, no thyromegaly LUNGS:  Clear to auscultation bilaterally CHEST:  Unremarkable HEART:  PMI not displaced or sustained,S1 and S2 within normal limits, no S3, no S4, no clicks, no rubs, no murmurs ABD:  Flat, positive bowel sounds normal in frequency in pitch, no bruits, no rebound, no guarding, no midline pulsatile mass, no hepatomegaly, no splenomegaly EXT:  2 plus pulses throughout, no edema, no cyanosis no clubbing   GENERAL:  Well appearing NECK:  No jugular venous distention, waveform within normal limits, carotid upstroke brisk and symmetric, no bruits, no thyromegaly LUNGS:  Clear to auscultation bilaterally BACK:  No CVA tenderness CHEST:  Unremarkable HEART:  PMI not displaced or sustained,S1 and S2 within normal limits, no S3, no S4, no clicks, no rubs, no murmurs ABD:  Flat, positive bowel sounds normal in frequency in pitch, no bruits, no rebound, no guarding, no midline pulsatile mass, no hepatomegaly, no splenomegaly EXT:  2 plus pulses throughout, no edema, no cyanosis no clubbing  Lab Results  Component Value Date   TSH 1.56 10/07/2015   EKG:  Sinus rhythm, rate 71, axis within  normal limits, intervals within normal limits, no acute ST-T wave changes.   ASSESSMENT AND PLAN  CHEST PAIN:  She has had no further chest pain.  No further work up is indicated.    ATRIAL FIB:    Ms. ELIJAH MICHAELIS has a CHA2DS2 - VASc score of 3 with a risk of stroke of 3.2%.  However, she has not wanted to use any anticoagulation.  She understands her stroke risk and that ASA is not effective.    HTN:  Her blood pressure is slightly elevated.  She has not wanted further therapy.  She needs diet and exercise for improved weight control.   PALPITATIONS:  She  says she has no further palps.

## 2016-10-10 ENCOUNTER — Encounter: Payer: Self-pay | Admitting: Cardiology

## 2016-10-10 ENCOUNTER — Ambulatory Visit (INDEPENDENT_AMBULATORY_CARE_PROVIDER_SITE_OTHER): Payer: Medicare HMO | Admitting: Cardiology

## 2016-10-10 VITALS — BP 132/96 | HR 70 | Ht 70.0 in | Wt 223.0 lb

## 2016-10-10 DIAGNOSIS — I10 Essential (primary) hypertension: Secondary | ICD-10-CM

## 2016-10-10 DIAGNOSIS — R079 Chest pain, unspecified: Secondary | ICD-10-CM

## 2016-10-10 DIAGNOSIS — I480 Paroxysmal atrial fibrillation: Secondary | ICD-10-CM | POA: Diagnosis not present

## 2016-10-10 NOTE — Patient Instructions (Signed)
Medication Instructions:  The current medical regimen is effective;  continue present plan and medications.  Follow-Up: Follow up as needed with Dr Hochrein.  Thank you for choosing [facility]!!     

## 2016-11-09 ENCOUNTER — Encounter: Payer: Self-pay | Admitting: *Deleted

## 2016-11-21 ENCOUNTER — Ambulatory Visit: Payer: Medicare HMO | Admitting: Family Medicine

## 2016-12-04 ENCOUNTER — Encounter: Payer: Self-pay | Admitting: Family Medicine

## 2016-12-04 ENCOUNTER — Ambulatory Visit (INDEPENDENT_AMBULATORY_CARE_PROVIDER_SITE_OTHER): Payer: Medicare HMO | Admitting: Family Medicine

## 2016-12-04 VITALS — BP 135/77 | HR 93 | Temp 97.3°F | Ht 70.0 in | Wt 222.0 lb

## 2016-12-04 DIAGNOSIS — K2100 Gastro-esophageal reflux disease with esophagitis, without bleeding: Secondary | ICD-10-CM

## 2016-12-04 DIAGNOSIS — M26623 Arthralgia of bilateral temporomandibular joint: Secondary | ICD-10-CM

## 2016-12-04 DIAGNOSIS — E559 Vitamin D deficiency, unspecified: Secondary | ICD-10-CM | POA: Diagnosis not present

## 2016-12-04 DIAGNOSIS — I10 Essential (primary) hypertension: Secondary | ICD-10-CM

## 2016-12-04 DIAGNOSIS — I480 Paroxysmal atrial fibrillation: Secondary | ICD-10-CM

## 2016-12-04 DIAGNOSIS — E7800 Pure hypercholesterolemia, unspecified: Secondary | ICD-10-CM | POA: Diagnosis not present

## 2016-12-04 MED ORDER — HYDROCHLOROTHIAZIDE 25 MG PO TABS: 12.5000 mg | ORAL_TABLET | Freq: Every day | ORAL | 3 refills | Status: DC

## 2016-12-04 NOTE — Progress Notes (Signed)
Subjective:    Patient ID: Alison Garrett, female    DOB: [birth year], 71 y.o.   MRN: [id number]  HPI Pt here for follow up and management of chronic medical problems which includes hyperlipidemia, hypertension and a fib. She is taking medication regularly.  Patient complains of her jaw being sore and swollen.  She is requesting a refill on hydrochlorothiazide.  She will get lab work when she returns fasting.  She follows up regularly with a cardiologist.  She has a history of atrial fibrillation.  She also has stage III chronic kidney disease and a essential tremor and fibromyalgia.  She has multiple drug allergies.  The patient denies any chest pain or shortness of breath.  She has had ongoing issues with pain in both TM joint areas.  She has seen the chiropractor and has and has had some manipulations with her neck and some of the pain was worse after chiropractic visit.  She has not seen any swelling in any of the salivary glands immediately following eating so it sounds more like TMJ.  She does not do a lot of chewing gum.  She denies any chest pain pressure or palpitations.  She is still taking her baby aspirin once a day.  She denies any trouble with her stomach including nausea vomiting diarrhea or blood in the stool or change in bowel.  She is passing her water without problems.  Getting back to the jaws they are not swollen but they feel swollen.  She can take ibuprofen and we may let her try this for a short period of time along with using some warm wet compresses and eating foods that are more easy to chew and avoiding chewing gum.     Patient Active Problem List   Diagnosis Date Noted  . Chest pain 07/01/2016  . Paresthesias 06/07/2015  . Chronic kidney disease, stage 3 ([place]) 11/17/2014  . Atrial fibrillation with RVR ([place]) 11/16/2014  . Multiple drug allergies 08/11/2014  . Fatigue 01/12/2014  . Essential tremor 01/12/2014  . Metabolic syndrome 28/78/6767  . Hyperlipemia 09/17/2012    . Vitamin D deficiency 09/17/2012  . Fibromyalgia 09/17/2012  . Atrial fibrillation ([place]) 07/11/2011  . OBESITY, UNSPECIFIED 12/01/2008  . HYPERTENSION, BENIGN 12/01/2008  . ABNORMAL ELECTROCARDIOGRAM 12/01/2008   Outpatient Encounter Prescriptions as of 12/04/2016  Medication Sig  . hydrochlorothiazide (HYDRODIURIL) 25 MG tablet Take 0.5 tablets (12.5 mg total) by mouth daily.  . [DISCONTINUED] hydrochlorothiazide (HYDRODIURIL) 25 MG tablet Take 12.5 mg by mouth daily.  . metoprolol tartrate (LOPRESSOR) 25 MG tablet Take 25 mg by mouth. Take 1/2 tablet by mouth every  8 hours for palpitations as needed   No facility-administered encounter medications on file as of 12/04/2016.      Review of Systems  Constitutional: Negative.   HENT: Negative.        Bilateral jaw soreness / feels "swollen"  Eyes: Negative.   Respiratory: Negative.   Cardiovascular: Negative.   Gastrointestinal: Negative.   Endocrine: Negative.   Genitourinary: Negative.   Musculoskeletal: Negative.   Skin: Negative.   Allergic/Immunologic: Negative.   Neurological: Negative.   Hematological: Negative.   Psychiatric/Behavioral: Negative.        Objective:   Physical Exam  Constitutional: She is oriented to person, place, and time. She appears well-developed and well-nourished. No distress.  The patient is pleasant and alert and has been doing fairly well with her heart issues and heart irregularity issues.  HENT:  Head: Normocephalic and  atraumatic.  Right Ear: External ear normal.  Left Ear: External ear normal.  Nose: Nose normal.  Mouth/Throat: Oropharynx is clear and moist. No oropharyngeal exudate.  Eyes: Pupils are equal, round, and reactive to light. Conjunctivae and EOM are normal. Right eye exhibits no discharge. Left eye exhibits no discharge. No scleral icterus.  Neck: Normal range of motion. Neck supple. No thyromegaly present.  No bruits thyromegaly anterior cervical adenopathy   Cardiovascular: Normal rate, regular rhythm, normal heart sounds and intact distal pulses.   No murmur heard. Heart has a regular rate and rhythm today at 84/min  Pulmonary/Chest: Effort normal and breath sounds normal. No respiratory distress. She has no wheezes. She has no rales.  Clear anteriorly and posteriorly  Abdominal: Soft. Bowel sounds are normal. She exhibits no mass. There is no tenderness. There is no rebound and no guarding.  Slight left upper quadrant tenderness without liver or spleen enlargement bruits or masses  Musculoskeletal: Normal range of motion. She exhibits tenderness. She exhibits no edema.  Patient is tender at both TMJ areas.  This is where she says that she hurts sometimes with eating and biting into food.  She never has any swelling of the salivary glands on either side.  She does have some low neck pain.  This is posteriorly.  Lymphadenopathy:    She has no cervical adenopathy.  Neurological: She is alert and oriented to person, place, and time. She has normal reflexes. No cranial nerve deficit.  Reflexes of both upper and lower extremities are within normal limits  Skin: Skin is warm and dry. No rash noted.  Psychiatric: She has a normal mood and affect. Her behavior is normal. Judgment and thought content normal.  Nursing note and vitals reviewed.  BP 135/77 (BP Location: Left Arm)   Pulse 93   Temp (!) 97.3 F (36.3 C) (Oral)   Ht _0  (1.778 m)   Wt 222 lb (100.7 kg)   BMI 31.85 kg/m         Assessment & Plan:  1. Gastroesophageal reflux disease with esophagitis -Patient does not describe any symptoms with this today.  She was forewarned about taking ibuprofen and that if it bothered her stomach taking it after eating for the next 5 days twice daily that she should stop it. - CBC with Differential/Platelet; Future - Hepatic function panel; Future  2. Vitamin D deficiency -Continue current treatment pending results of lab work - CBC with  Differential/Platelet; Future - VITAMIN D 25 Hydroxy (Vit-D Deficiency, Fractures); Future  3. HYPERTENSION, BENIGN -The blood pressure is good today and she will continue with her current treatment - CBC with Differential/Platelet; Future - BMP8+EGFR; Future - Hepatic function panel; Future  4. Paroxysmal atrial fibrillation (HCC) -She only takes 1 aspirin a day there is a baby aspirin after eating and her rhythm today was normal sinus rhythm - CBC with Differential/Platelet; Future  5. Pure hypercholesterolemia -Continue with aggressive therapeutic lifestyle changes pending results of lab work - CBC with Differential/Platelet; Future - Lipid panel; Future  6. Bilateral temporomandibular joint pain -Use warm wet compresses 20 minutes 3 or 4 times daily take the ibuprofen as directed twice daily after breakfast and supper discontinuing this if there is any problem with stomach irritation swelling or raised blood pressure. -She will get back in touch with [place] after this period of time if she continues to have trouble with both TMJs we will get cervical spine films and may be due a referral at  that time to an oral surgeon for possible injection  Meds ordered this encounter  Medications  . hydrochlorothiazide (HYDRODIURIL) 25 MG tablet    Sig: Take 0.5 tablets (12.5 mg total) by mouth daily.    Dispense:  45 tablet    Refill:  3   Patient Instructions                       Medicare Annual Wellness Visit  [facility] and the medical providers at [facility] strive to bring you the best medical care.  In doing so we not only want to address your current medical conditions and concerns but also to detect new conditions early and prevent illness, disease and health-related problems.    Medicare offers a yearly Wellness Visit which allows our clinical staff to assess your need for preventative services including immunizations, lifestyle education, counseling to  decrease risk of preventable diseases and screening for fall risk and other medical concerns.    This visit is provided free of charge (no copay) for all Medicare recipients. The clinical pharmacists at [facility] have begun to conduct these Wellness Visits which will also include a thorough review of all your medications.    As you primary medical provider recommend that you make an appointment for your Annual Wellness Visit if you have not done so already this year.  You may set up this appointment before you leave today or you may call back ([phone]) and schedule an appointment.  Please make sure when you call that you mention that you are scheduling your Annual Wellness Visit with the clinical pharmacist so that the appointment may be made for the proper length of time.     Continue current medications. Continue good therapeutic lifestyle changes which include good diet and exercise. Fall precautions discussed with patient. If an FOBT was given today- please return it to our front desk. If you are over 74 years old - you may need Prevnar 40 or the adult Pneumonia vaccine.  **Flu shots are available--- please call and schedule a FLU-CLINIC appointment**  After your visit with [place] today you will receive a survey in the mail or online from [employer] regarding your care with [place]. Please take a moment to fill this out. Your feedback is very important to [place] as you can help [place] better understand your patient needs as well as improve your experience and satisfaction. WE CARE ABOUT YOU!!!  Take Advil or ibuprofen 1 twice daily after breakfast and supper for the next 5 days but if this raises her blood pressure irritates her stomach or causes any swelling you should stop taking this.  Make sure you take it after eating.  Use warm wet compresses to both jaws and posterior neck area 20 minutes 3 or 4 times daily. If the pain continues she may need a referral to an oral surgeon for  possible injection and may need C-spine films.   Arrie Senate MD

## 2016-12-04 NOTE — Patient Instructions (Addendum)
Medicare Annual Wellness Visit  [facility] and the medical providers at [facility] strive to bring you the best medical care.  In doing so we not only want to address your current medical conditions and concerns but also to detect new conditions early and prevent illness, disease and health-related problems.    Medicare offers a yearly Wellness Visit which allows our clinical staff to assess your need for preventative services including immunizations, lifestyle education, counseling to decrease risk of preventable diseases and screening for fall risk and other medical concerns.    This visit is provided free of charge (no copay) for all Medicare recipients. The clinical pharmacists at [facility] have begun to conduct these Wellness Visits which will also include a thorough review of all your medications.    As you primary medical provider recommend that you make an appointment for your Annual Wellness Visit if you have not done so already this year.  You may set up this appointment before you leave today or you may call back ([phone]) and schedule an appointment.  Please make sure when you call that you mention that you are scheduling your Annual Wellness Visit with the clinical pharmacist so that the appointment may be made for the proper length of time.     Continue current medications. Continue good therapeutic lifestyle changes which include good diet and exercise. Fall precautions discussed with patient. If an FOBT was given today- please return it to our front desk. If you are over 15 years old - you may need Prevnar 68 or the adult Pneumonia vaccine.  **Flu shots are available--- please call and schedule a FLU-CLINIC appointment**  After your visit with [place] today you will receive a survey in the mail or online from [employer] regarding your care with [place]. Please take a moment to fill this out. Your feedback is very  important to [place] as you can help [place] better understand your patient needs as well as improve your experience and satisfaction. WE CARE ABOUT YOU!!!  Take Advil or ibuprofen 1 twice daily after breakfast and supper for the next 5 days but if this raises her blood pressure irritates her stomach or causes any swelling you should stop taking this.  Make sure you take it after eating.  Use warm wet compresses to both jaws and posterior neck area 20 minutes 3 or 4 times daily. If the pain continues she may need a referral to an oral surgeon for possible injection and may need C-spine films.

## 2016-12-07 ENCOUNTER — Other Ambulatory Visit: Payer: Medicare HMO

## 2016-12-07 DIAGNOSIS — K2100 Gastro-esophageal reflux disease with esophagitis, without bleeding: Secondary | ICD-10-CM

## 2016-12-07 DIAGNOSIS — E7800 Pure hypercholesterolemia, unspecified: Secondary | ICD-10-CM | POA: Diagnosis not present

## 2016-12-07 DIAGNOSIS — E559 Vitamin D deficiency, unspecified: Secondary | ICD-10-CM | POA: Diagnosis not present

## 2016-12-07 DIAGNOSIS — I480 Paroxysmal atrial fibrillation: Secondary | ICD-10-CM | POA: Diagnosis not present

## 2016-12-07 DIAGNOSIS — I10 Essential (primary) hypertension: Secondary | ICD-10-CM | POA: Diagnosis not present

## 2016-12-08 LAB — BMP8+EGFR
BUN / CREAT RATIO: 14 (ref 12–28)
BUN: 15 mg/dL (ref 8–27)
CHLORIDE: 104 mmol/L (ref 96–106)
CO2: 27 mmol/L (ref 20–29)
CREATININE: 1.06 mg/dL — AB (ref 0.57–1.00)
Calcium: 9 mg/dL (ref 8.7–10.3)
GFR calc non Af Amer: 53 mL/min/{1.73_m2} — ABNORMAL LOW (ref >59)
GFR, EST AFRICAN AMERICAN: 61 mL/min/{1.73_m2} (ref >59)
GLUCOSE: 99 mg/dL (ref 65–99)
Potassium: 4.4 mmol/L (ref 3.5–5.2)
SODIUM: 144 mmol/L (ref 134–144)

## 2016-12-08 LAB — HEPATIC FUNCTION PANEL
ALBUMIN: 3.9 g/dL (ref 3.5–4.8)
ALK PHOS: 102 IU/L (ref 39–117)
ALT: 32 IU/L (ref 0–32)
AST: 29 IU/L (ref 0–40)
Bilirubin Total: 0.7 mg/dL (ref 0.0–1.2)
Bilirubin, Direct: 0.16 mg/dL (ref 0.00–0.40)
TOTAL PROTEIN: 6.5 g/dL (ref 6.0–8.5)

## 2016-12-08 LAB — CBC WITH DIFFERENTIAL/PLATELET
BASOS: 1 %
Basophils Absolute: 0 10*3/uL (ref 0.0–0.2)
EOS (ABSOLUTE): 0.1 10*3/uL (ref 0.0–0.4)
EOS: 1 %
Hematocrit: 42.5 % (ref 34.0–46.6)
Hemoglobin: 14.4 g/dL (ref 11.1–15.9)
IMMATURE GRANS (ABS): 0 10*3/uL (ref 0.0–0.1)
IMMATURE GRANULOCYTES: 0 %
LYMPHS: 27 %
Lymphocytes Absolute: 1.5 10*3/uL (ref 0.7–3.1)
MCH: 29.2 pg (ref 26.6–33.0)
MCHC: 33.9 g/dL (ref 31.5–35.7)
MCV: 86 fL (ref 79–97)
MONOS ABS: 0.5 10*3/uL (ref 0.1–0.9)
Monocytes: 8 %
NEUTROS ABS: 3.5 10*3/uL (ref 1.4–7.0)
NEUTROS PCT: 63 %
PLATELETS: 236 10*3/uL (ref 150–379)
RBC: 4.93 x10E6/uL (ref 3.77–5.28)
RDW: 13.9 % (ref 12.3–15.4)
WBC: 5.6 10*3/uL (ref 3.4–10.8)

## 2016-12-08 LAB — LIPID PANEL
CHOLESTEROL TOTAL: 154 mg/dL (ref 100–199)
Chol/HDL Ratio: 4.5 ratio — ABNORMAL HIGH (ref 0.0–4.4)
HDL: 34 mg/dL — ABNORMAL LOW (ref >39)
LDL Calculated: 96 mg/dL (ref 0–99)
Triglycerides: 121 mg/dL (ref 0–149)
VLDL Cholesterol Cal: 24 mg/dL (ref 5–40)

## 2016-12-08 LAB — VITAMIN D 25 HYDROXY (VIT D DEFICIENCY, FRACTURES): VIT D 25 HYDROXY: 22.4 ng/mL — AB (ref 30.0–100.0)

## 2017-01-14 ENCOUNTER — Telehealth: Payer: Self-pay | Admitting: Family Medicine

## 2017-01-14 MED ORDER — HYDROCHLOROTHIAZIDE 25 MG PO TABS: 12.5000 mg | ORAL_TABLET | Freq: Every day | ORAL | 3 refills | Status: DC

## 2017-01-14 MED ORDER — METOPROLOL TARTRATE 25 MG PO TABS: ORAL_TABLET | ORAL | 2 refills | Status: DC

## 2017-01-14 NOTE — Telephone Encounter (Signed)
rx fixed

## 2017-04-15 ENCOUNTER — Emergency Department (EMERGENCY_DEPARTMENT_HOSPITAL)
Admit: 2017-04-15 | Discharge: 2017-04-15 | Disposition: A | Discharge status: Home / Self Care | Payer: Medicare HMO | Attending: Emergency Medicine | Admitting: Emergency Medicine

## 2017-04-15 ENCOUNTER — Other Ambulatory Visit: Payer: Self-pay

## 2017-04-15 ENCOUNTER — Emergency Department (HOSPITAL_COMMUNITY)
Admission: EM | Admit: 2017-04-15 | Discharge: 2017-04-15 | Disposition: A | Discharge status: Home / Self Care | Payer: Medicare HMO | Attending: Emergency Medicine | Admitting: Emergency Medicine

## 2017-04-15 ENCOUNTER — Emergency Department (HOSPITAL_COMMUNITY): Payer: Medicare HMO

## 2017-04-15 ENCOUNTER — Encounter (HOSPITAL_COMMUNITY): Payer: Self-pay

## 2017-04-15 DIAGNOSIS — Z79899 Other long term (current) drug therapy: Secondary | ICD-10-CM | POA: Diagnosis not present

## 2017-04-15 DIAGNOSIS — R0600 Dyspnea, unspecified: Secondary | ICD-10-CM | POA: Diagnosis not present

## 2017-04-15 DIAGNOSIS — Z91040 Latex allergy status: Secondary | ICD-10-CM | POA: Insufficient documentation

## 2017-04-15 DIAGNOSIS — I129 Hypertensive chronic kidney disease with stage 1 through stage 4 chronic kidney disease, or unspecified chronic kidney disease: Secondary | ICD-10-CM | POA: Insufficient documentation

## 2017-04-15 DIAGNOSIS — N183 Chronic kidney disease, stage 3 (moderate): Secondary | ICD-10-CM | POA: Diagnosis not present

## 2017-04-15 DIAGNOSIS — M79604 Pain in right leg: Secondary | ICD-10-CM | POA: Diagnosis not present

## 2017-04-15 DIAGNOSIS — M79605 Pain in left leg: Secondary | ICD-10-CM | POA: Insufficient documentation

## 2017-04-15 DIAGNOSIS — R0609 Other forms of dyspnea: Secondary | ICD-10-CM | POA: Insufficient documentation

## 2017-04-15 DIAGNOSIS — M7989 Other specified soft tissue disorders: Secondary | ICD-10-CM | POA: Diagnosis not present

## 2017-04-15 DIAGNOSIS — I4891 Unspecified atrial fibrillation: Secondary | ICD-10-CM | POA: Diagnosis not present

## 2017-04-15 DIAGNOSIS — R0602 Shortness of breath: Secondary | ICD-10-CM | POA: Diagnosis present

## 2017-04-15 LAB — BASIC METABOLIC PANEL
ANION GAP: 13 (ref 5–15)
BUN: 14 mg/dL (ref 6–20)
CHLORIDE: 103 mmol/L (ref 101–111)
CO2: 24 mmol/L (ref 22–32)
Calcium: 9.3 mg/dL (ref 8.9–10.3)
Creatinine, Ser: 1.03 mg/dL — ABNORMAL HIGH (ref 0.44–1.00)
GFR calc non Af Amer: 54 mL/min — ABNORMAL LOW (ref >60)
Glucose, Bld: 105 mg/dL — ABNORMAL HIGH (ref 65–99)
POTASSIUM: 3.5 mmol/L (ref 3.5–5.1)
Sodium: 140 mmol/L (ref 135–145)

## 2017-04-15 LAB — CBC
HEMATOCRIT: 44.5 % (ref 36.0–46.0)
HEMOGLOBIN: 15.5 g/dL — AB (ref 12.0–15.0)
MCH: 30 pg (ref 26.0–34.0)
MCHC: 34.8 g/dL (ref 30.0–36.0)
MCV: 86.1 fL (ref 78.0–100.0)
Platelets: 237 10*3/uL (ref 150–400)
RBC: 5.17 MIL/uL — AB (ref 3.87–5.11)
RDW: 12.9 % (ref 11.5–15.5)
WBC: 9 10*3/uL (ref 4.0–10.5)

## 2017-04-15 LAB — I-STAT TROPONIN, ED: Troponin i, poc: 0 ng/mL (ref 0.00–0.08)

## 2017-04-15 LAB — D-DIMER, QUANTITATIVE (NOT AT ARMC): D-Dimer, Quant: 0.64 ug/mL-FEU — ABNORMAL HIGH (ref 0.00–0.50)

## 2017-04-15 NOTE — Discharge Instructions (Signed)
Return to the emergency department with any new or concerning symptoms. Follow up with Dr. Percival Spanish for recheck.

## 2017-04-15 NOTE — Progress Notes (Signed)
Bilateral lower extremity venous duplex completed. No evidence of DVT, superficial thrombosis, or Baker's cyst. [place] 04/15/2017 7:58 PM

## 2017-04-15 NOTE — ED Provider Notes (Signed)
[facility] EMERGENCY DEPARTMENT Provider Note   CSN: [id number] Arrival date & time: 04/15/17  1642     History   Chief Complaint Chief Complaint  Patient presents with  . Shortness of Breath  . Leg Pain    HPI Alison Garrett is a 72 y.o. female.  Patient with a history of a fib (on no medications for rate or anticoagulation), HTN, fibromyalgia, HLD presents with SOB. Symptoms have progressed over the last 4-5 days. No cough or fever. She recently visited [place], returning on February 3 and reports bilateral lower extremity swelling on the return flight. Swelling resolved after 1-2 days and her legs became sore. No redness or fever at any time. She denies chest pain, or palpitations like experienced with her atrial fibrillation. No abdominal pain, nausea or vomiting. No diaphoresis.   The history is provided by the patient and a relative. No language interpreter was used.  Shortness of Breath  Associated symptoms include leg pain and leg swelling. Pertinent negatives include no fever and no chest pain.  Leg Pain   Pertinent negatives include no numbness.    Past Medical History:  Diagnosis Date  . Afib ([place])   . Dyslipidemia   . Essential tremor 01/12/2014  . Fibromyalgia    Not currently under active treatment  . GERD (gastroesophageal reflux disease)   . Hiatal hernia   . Hypertension   . Obesity     Patient Active Problem List   Diagnosis Date Noted  . Chest pain 07/01/2016  . Paresthesias 06/07/2015  . Chronic kidney disease, stage 3 ([place]) 11/17/2014  . Atrial fibrillation with RVR ([place]) 11/16/2014  . Multiple drug allergies 08/11/2014  . Fatigue 01/12/2014  . Essential tremor 01/12/2014  . Metabolic syndrome 17/51/0258  . Hyperlipemia 09/17/2012  . Vitamin D deficiency 09/17/2012  . Fibromyalgia 09/17/2012  . Atrial fibrillation ([place]) 07/11/2011  . OBESITY, UNSPECIFIED 12/01/2008  . HYPERTENSION, BENIGN 12/01/2008  . ABNORMAL  ELECTROCARDIOGRAM 12/01/2008    Past Surgical History:  Procedure Laterality Date  . ABDOMINAL HYSTERECTOMY      OB History    No data available       Home Medications    Prior to Admission medications   Medication Sig Start Date End Date Taking? Authorizing Provider  hydrochlorothiazide (HYDRODIURIL) 25 MG tablet Take 0.5 tablets (12.5 mg total) by mouth daily. 01/14/17  Yes Chipper Herb, MD    Family History Family History  Problem Relation Age of Onset  . Coronary artery disease Mother   . Heart disease Mother        CABG  . Cancer Mother   . COPD Father   . Hepatitis Sister        autoimune  . Diabetes Sister     Social History Social History   Tobacco Use  . Smoking status: Never Smoker  . Smokeless tobacco: Never Used  Substance Use Topics  . Alcohol use: No    Alcohol/week: 0.0 oz  . Drug use: No     Allergies   Contrast media [iodinated diagnostic agents]; Diltiazem; Penicillins; Sulfonamide derivatives; Benicar [olmesartan]; Celebrex [celecoxib]; Cymbalta [duloxetine hcl]; Latex; Neurontin [gabapentin]; Norvasc [amlodipine besylate]; Prozac [fluoxetine hcl]; Xanax [alprazolam]; Zoloft [sertraline hcl]; and Zyprexa [olanzapine]   Review of Systems Review of Systems  Constitutional: Negative for chills and fever.  HENT: Negative.   Respiratory: Positive for shortness of breath.   Cardiovascular: Positive for leg swelling. Negative for chest pain and palpitations.  Gastrointestinal: Negative.  Negative for nausea.  Musculoskeletal:       See HPI.  Skin: Negative.  Negative for color change.  Neurological: Negative.  Negative for weakness and numbness.     Physical Exam Updated Vital Signs BP (!) 148/98 (BP Location: Left Arm)   Pulse 68   Temp 97.8 F (36.6 C) (Oral)   Resp 18   SpO2 96%   Physical Exam  Constitutional: She appears well-developed and well-nourished.  HENT:  Head: Normocephalic.  Neck: Normal range of motion. Neck  supple.  Cardiovascular: Normal rate and regular rhythm.  Pulmonary/Chest: Effort normal and breath sounds normal. She has no wheezes. She has no rhonchi. She has no rales.  Abdominal: Soft. Bowel sounds are normal. There is no tenderness. There is no rebound and no guarding.  Musculoskeletal: Normal range of motion.       Right lower leg: She exhibits no edema.       Left lower leg: She exhibits no edema.  Neurological: She is alert. No cranial nerve deficit.  Skin: Skin is warm and dry. No rash noted.  Psychiatric: She has a normal mood and affect.     ED Treatments / Results  Labs (all labs ordered are listed, but only abnormal results are displayed) Labs Reviewed  BASIC METABOLIC PANEL - Abnormal; Notable for the following components:      Result Value   Glucose, Bld 105 (*)    Creatinine, Ser 1.03 (*)    GFR calc non Af Amer 54 (*)    All other components within normal limits  CBC - Abnormal; Notable for the following components:   RBC 5.17 (*)    Hemoglobin 15.5 (*)    All other components within normal limits  D-DIMER, QUANTITATIVE (NOT AT [facility]) - Abnormal; Notable for the following components:   D-Dimer, Quant 0.64 (*)    All other components within normal limits  I-STAT TROPONIN, ED   Results for orders placed or performed during the hospital encounter of 14/48/18  Basic metabolic panel  Result Value Ref Range   Sodium 140 135 - 145 mmol/L   Potassium 3.5 3.5 - 5.1 mmol/L   Chloride 103 101 - 111 mmol/L   CO2 24 22 - 32 mmol/L   Glucose, Bld 105 (H) 65 - 99 mg/dL   BUN 14 6 - 20 mg/dL   Creatinine, Ser 1.03 (H) 0.44 - 1.00 mg/dL   Calcium 9.3 8.9 - 10.3 mg/dL   GFR calc non Af Amer 54 (L) >60 mL/min   GFR calc Af Amer >60 >60 mL/min   Anion gap 13 5 - 15  CBC  Result Value Ref Range   WBC 9.0 4.0 - 10.5 K/uL   RBC 5.17 (H) 3.87 - 5.11 MIL/uL   Hemoglobin 15.5 (H) 12.0 - 15.0 g/dL   HCT 44.5 36.0 - 46.0 %   MCV 86.1 78.0 - 100.0 fL   MCH 30.0 26.0 - 34.0  pg   MCHC 34.8 30.0 - 36.0 g/dL   RDW 12.9 11.5 - 15.5 %   Platelets 237 150 - 400 K/uL  D-dimer, quantitative  Result Value Ref Range   D-Dimer, Quant 0.64 (H) 0.00 - 0.50 ug/mL-FEU  I-stat troponin, ED  Result Value Ref Range   Troponin i, poc 0.00 0.00 - 0.08 ng/mL   Comment 3            EKG  EKG Interpretation  Date/Time:  Monday April 15 2017 16:51:29 EST Ventricular Rate:  88  PR Interval:  164 QRS Duration: 88 QT Interval:  374 QTC Calculation: 452 R Axis:   61 Text Interpretation:  Normal sinus rhythm Normal ECG Confirmed by Isla Pence [phone]) on 04/15/2017 10:39:03 PM       Radiology Dg Chest 2 View  Result Date: 04/15/2017 CLINICAL DATA:  Chest discomfort and dyspnea x3 days. History of atrial fibrillation and hypertension. EXAM: CHEST  2 VIEW COMPARISON:  07/11/2016 FINDINGS: The heart size and mediastinal contours are within normal limits and stable. Both lungs are clear. The visualized skeletal structures are unremarkable. IMPRESSION: No active cardiopulmonary disease. Electronically Signed   By: Ashley Royalty M.D.   On: 04/15/2017 18:22    Procedures Procedures (including critical care time)  Medications Ordered in ED Medications - No data to display   Initial Impression / Assessment and Plan / ED Course  I have reviewed the triage vital signs and the nursing notes.  Pertinent labs & imaging results that were available during my care of the patient were reviewed by me and considered in my medical decision making (see chart for details).     Patient here for evaluation of SOB x 4-5 days without cough or fever. Recent long travel with LE swelling and soreness afterward.   The patient describes SOB as associated with normal exertion that quickly resolved with rest. No history of CHF or fluid overload.   Exam is reassuring. No LE edema. Lungs clear. No tachycardia or hypoxia. Doppler studies of bilateral LE's negative for DVT. Doubt PE. CXR clear without  edema. No evidence CHF. Normal EKG, negative troponin. Doubt ACS. No current atrial fibrillation.   She is seen and evaluated by Dr. Gilford Raid and felt appropriate for discharge home. She will follow up with Dr. Percival Spanish.  Final Clinical Impressions(s) / ED Diagnoses   Final diagnoses:  None   1. Dyspnea   ED Discharge Orders    None       Dennie Bible 04/15/17 2304    Isla Pence, MD 04/15/17 [phone]

## 2017-04-15 NOTE — ED Triage Notes (Addendum)
Pt reports recent trip to [place] with long flight. When returning home pt states she had sever swelling to bilateral lower extremities and continues to have pain behind left calf and knee. Pt endorses sob worse with exertion since returning home as well. Pt reports last night she was awoken from her sleep extremely sob. Endorses central cp. Denies taking blood thinners.

## 2017-04-15 NOTE — ED Notes (Signed)
Results reviewed

## 2017-05-21 NOTE — Progress Notes (Signed)
HPI The patient presents for evaluation of atrial fibrillation.  Since I last saw her she was in the ED with dyspnea.  at that time she had had leg swelling and pain and she had had a long flight from [place].    I reviewed these records for this visit.   Troponin was normal.  CXR was unremarkable.  EKG was NSR.  She said the swelling went away and she was not treated specifically for this.  She has not had it since that time.  She has continued to get episodes of weakness that happens occasionally but only after she is eaten.  She says if she goes to eat something else like something sweet the symptoms improved.  She says she will feel weak.  She will take her blood pressure and it is okay and her heart rate has been okay.  She says it does not feel like her fibrillation.  He does not feel like her heart is out of rhythm.  She is not describing chest discomfort, neck or arm discomfort.  She otherwise is active working at the [employer] and she has no problems with this.   Allergies  Allergen Reactions  . Contrast Media [Iodinated Diagnostic Agents] Anaphylaxis  . Penicillins Anaphylaxis  . Sulfonamide Derivatives Hives  . Benicar [Olmesartan] Rash  . Celebrex [Celecoxib] Anxiety  . Cymbalta [Duloxetine Hcl] Itching  . Latex Rash  . Neurontin [Gabapentin] Nausea Only  . Norvasc [Amlodipine Besylate] Swelling  . Prozac [Fluoxetine Hcl] Other (See Comments)  . Xanax [Alprazolam] Other (See Comments)  . Zoloft [Sertraline Hcl] Itching  . Zyprexa [Olanzapine] Other (See Comments)    Wt gain    Current Outpatient Medications  Medication Sig Dispense Refill  . hydrochlorothiazide (HYDRODIURIL) 25 MG tablet Take 0.5 tablets (12.5 mg total) by mouth daily. 45 tablet 3   No current facility-administered medications for this visit.     Past Medical History:  Diagnosis Date  . Afib ([place])   . Dyslipidemia   . Essential tremor 01/12/2014  . Fibromyalgia    Not currently under active  treatment  . GERD (gastroesophageal reflux disease)   . Hiatal hernia   . Hypertension   . Obesity     Past Surgical History:  Procedure Laterality Date  . ABDOMINAL HYSTERECTOMY      ROS: As stated in the HPI and negative for all other systems.  PHYSICAL EXAM BP (!) 140/100   Pulse 84   Ht 5\' 10"  (1.778 m)   Wt 232 lb (105.2 kg)   BMI 33.29 kg/m   GENERAL:  Well appearing NECK:  No jugular venous distention, waveform within normal limits, carotid upstroke brisk and symmetric, no bruits, no thyromegaly LUNGS:  Clear to auscultation bilaterally CHEST:  Unremarkable HEART:  PMI not displaced or sustained,S1 and S2 within normal limits, no S3, no S4, no clicks, no rubs, no murmurs ABD:  Flat, positive bowel sounds normal in frequency in pitch, no bruits, no rebound, no guarding, no midline pulsatile mass, no hepatomegaly, no splenomegaly EXT:  2 plus pulses throughout, trace leg edema, no cyanosis no clubbing   Lab Results  Component Value Date   TSH 1.56 10/07/2015   EKG:   From ED.   NSR, rate 88, axis WNL, intervals WNL, no acute ST T wave changes 04/16/17   ASSESSMENT AND PLAN  DYSPNEA:     I did review the ED records as above and I see no clear etiology.  This seems to have improved.  No change in therapy is indicated.  ATRIAL FIB:    [name] has a CHA2DS2 - VASc score of 3 with a risk of stroke of 3.2%.    She still does not want to take a blood thinner and we have talked about this.   However, she has not wanted to use any anticoagulation.  She understands her stroke risk and that ASA is not effective.    HTN:  Her blood pressure is at target.  No change in therapy.   WEAKNESS: At this point I do not think there is a cardiac etiology to this necessarily and he clearly does not sound like her arrhythmia.  However, if it continues and there is no other alternative explanation I could put on an event monitor.  She is going to try to get her blood sugar checked  when the episodes happen.  At this point there is no change in therapy.  I will order a TSH.

## 2017-05-22 ENCOUNTER — Ambulatory Visit: Payer: Medicare HMO | Admitting: Cardiology

## 2017-05-22 ENCOUNTER — Encounter: Payer: Self-pay | Admitting: Cardiology

## 2017-05-22 VITALS — BP 140/100 | HR 84 | Ht 70.0 in | Wt 232.0 lb

## 2017-05-22 DIAGNOSIS — I480 Paroxysmal atrial fibrillation: Secondary | ICD-10-CM

## 2017-05-22 DIAGNOSIS — R531 Weakness: Secondary | ICD-10-CM

## 2017-05-22 DIAGNOSIS — I10 Essential (primary) hypertension: Secondary | ICD-10-CM

## 2017-05-22 DIAGNOSIS — R0602 Shortness of breath: Secondary | ICD-10-CM | POA: Diagnosis not present

## 2017-05-22 NOTE — Patient Instructions (Addendum)
Medication Instructions:  The current medical regimen is effective;  continue present plan and medications.  Please have blood work at [facility])  Follow-Up: Follow up in 6 months with Dr. Percival Spanish.  You will receive a letter in the mail 2 months before you are due.  Please call us when you receive this letter to schedule your follow up appointment.  If you need a refill on your cardiac medications before your next appointment, please call your pharmacy.  Thank you for choosing [facility]!!

## 2017-06-12 ENCOUNTER — Encounter: Payer: Self-pay | Admitting: Family Medicine

## 2017-06-12 ENCOUNTER — Ambulatory Visit (INDEPENDENT_AMBULATORY_CARE_PROVIDER_SITE_OTHER): Payer: Medicare HMO | Admitting: Family Medicine

## 2017-06-12 VITALS — BP 127/85 | HR 74 | Temp 97.5°F | Ht 70.0 in | Wt 230.0 lb

## 2017-06-12 DIAGNOSIS — M797 Fibromyalgia: Secondary | ICD-10-CM | POA: Diagnosis not present

## 2017-06-12 DIAGNOSIS — I10 Essential (primary) hypertension: Secondary | ICD-10-CM

## 2017-06-12 DIAGNOSIS — E7800 Pure hypercholesterolemia, unspecified: Secondary | ICD-10-CM | POA: Diagnosis not present

## 2017-06-12 DIAGNOSIS — E559 Vitamin D deficiency, unspecified: Secondary | ICD-10-CM | POA: Diagnosis not present

## 2017-06-12 DIAGNOSIS — K2100 Gastro-esophageal reflux disease with esophagitis, without bleeding: Secondary | ICD-10-CM

## 2017-06-12 DIAGNOSIS — Z1231 Encounter for screening mammogram for malignant neoplasm of breast: Secondary | ICD-10-CM | POA: Diagnosis not present

## 2017-06-12 DIAGNOSIS — R229 Localized swelling, mass and lump, unspecified: Secondary | ICD-10-CM | POA: Diagnosis not present

## 2017-06-12 DIAGNOSIS — Z889 Allergy status to unspecified drugs, medicaments and biological substances status: Secondary | ICD-10-CM | POA: Diagnosis not present

## 2017-06-12 DIAGNOSIS — I480 Paroxysmal atrial fibrillation: Secondary | ICD-10-CM | POA: Diagnosis not present

## 2017-06-12 NOTE — Patient Instructions (Addendum)
Medicare Annual Wellness Visit  [facility] and the medical providers at [facility] strive to bring you the best medical care.  In doing so we not only want to address your current medical conditions and concerns but also to detect new conditions early and prevent illness, disease and health-related problems.    Medicare offers a yearly Wellness Visit which allows our clinical staff to assess your need for preventative services including immunizations, lifestyle education, counseling to decrease risk of preventable diseases and screening for fall risk and other medical concerns.    This visit is provided free of charge (no copay) for all Medicare recipients. The clinical pharmacists at [facility] have begun to conduct these Wellness Visits which will also include a thorough review of all your medications.    As you primary medical provider recommend that you make an appointment for your Annual Wellness Visit if you have not done so already this year.  You may set up this appointment before you leave today or you may call back ([phone]) and schedule an appointment.  Please make sure when you call that you mention that you are scheduling your Annual Wellness Visit with the clinical pharmacist so that the appointment may be made for the proper length of time.     Continue current medications. Continue good therapeutic lifestyle changes which include good diet and exercise. Fall precautions discussed with patient. If an FOBT was given today- please return it to our front desk. If you are over 33 years old - you may need Prevnar 50 or the adult Pneumonia vaccine.  **Flu shots are available--- please call and schedule a FLU-CLINIC appointment**  After your visit with [place] today you will receive a survey in the mail or online from [employer] regarding your care with [place]. Please take a moment to fill this out. Your feedback is very  important to [place] as you can help [place] better understand your patient needs as well as improve your experience and satisfaction. WE CARE ABOUT YOU!!!   Keep a good dietary history to see see if we can correlate any of your shortness of breath symptoms with foods especially those that have food dye in them Continue to drink plenty of water and avoid caffeine Follow-up with cardiology as planned Reduce sugar intake as much as possible

## 2017-06-12 NOTE — Progress Notes (Signed)
Subjective:    Patient ID: Alison Garrett, female    DOB: 01-Jul-1945, 72 y.o.   MRN: [id number]  HPI Pt here for follow up and management of chronic medical problems which includes hyperlipidemia and hypertension. She is taking medication regularly.  The patient had a recent visit to the emergency room in [place] because of shortness of breath and weakness.  She also complains of knots on the left arm.  She had a CBC and BMP.  Only the creatinine was slightly elevated.  EKG had normal sinus rhythm.  She was discharged home with a diagnosis of dyspnea.  She refuses to do pelvic exams and Pap smear but plans to get a mammogram today at 11:00.  She will also get lab work.  She also refuses to take the flu shot or Prevnar vaccine.  Her vital signs today are stable and her weight is down a couple pounds since the last visit.  This patient's history is significant due to multiple allergies to multiple medications.  She does have a history of atrial fibrillation with rapid ventricular response chronic kidney disease and fibromyalgia.  The patient says these episodes of shortness of breath and weakness her fairly frequently and that she does monitor her pulse rates and irregularity and that these are always good.  She also has a monitor for her oxygen level and this is good.  She really thinks it is food/possibly sugar related.  I told her to keep a diet history and see if things that she continues to eat or triggering the symptoms.  She says that she does not eat correctly and definitely knows that she needs to lose weight.  She does drink plenty of water and avoids caffeine.  Other than these episodes of shortness of breath she does not have shortness of breath generally.  One recent episode was triggered after she ate some jellybeans.  I question whether diet could be playing a role.  She does have a lot of allergies.  She does not have any chest pain pressure or tightness other than with these episodes.  She  was seen by the cardiologist as mentioned and he reassured her that he did not think any of this was related to her heart.  She is swallowing her food okay and has no nausea vomiting diarrhea blood in the stool or black tarry bowel movements.  She is passing her water without problems.  She does complain of a knot on her left arm and thinks it could be where she has hit herself.  The knot is tender to touch.      Patient Active Problem List   Diagnosis Date Noted  . SOB (shortness of breath) 05/22/2017  . Chest pain 07/01/2016  . Paresthesias 06/07/2015  . Chronic kidney disease, stage 3 ([place]) 11/17/2014  . Atrial fibrillation with RVR ([place]) 11/16/2014  . Multiple drug allergies 08/11/2014  . Fatigue 01/12/2014  . Essential tremor 01/12/2014  . Metabolic syndrome 83/41/9622  . Hyperlipemia 09/17/2012  . Vitamin D deficiency 09/17/2012  . Fibromyalgia 09/17/2012  . Atrial fibrillation ([place]) 07/11/2011  . OBESITY, UNSPECIFIED 12/01/2008  . HYPERTENSION, BENIGN 12/01/2008  . ABNORMAL ELECTROCARDIOGRAM 12/01/2008   Outpatient Encounter Medications as of 06/12/2017  Medication Sig  . hydrochlorothiazide (HYDRODIURIL) 25 MG tablet Take 0.5 tablets (12.5 mg total) by mouth daily.   No facility-administered encounter medications on file as of 06/12/2017.      Review of Systems  Constitutional: Negative.   HENT: Negative.  Eyes: Negative.   Respiratory: Negative.   Cardiovascular: Negative.   Gastrointestinal: Negative.   Endocrine: Negative.   Genitourinary: Negative.   Musculoskeletal: Negative.        Knot - left arm/ sore   Skin: Negative.   Allergic/Immunologic: Negative.   Neurological: Positive for weakness (went to ER 3/4- SOB and weak ).  Hematological: Negative.   Psychiatric/Behavioral: Negative.        Objective:   Physical Exam  Constitutional: She is oriented to person, place, and time. She appears well-developed and well-nourished.  The patient is pleasant and  relaxed and the recent hospital visit was reviewed with her.  HENT:  Head: Normocephalic and atraumatic.  Right Ear: External ear normal.  Left Ear: External ear normal.  Nose: Nose normal.  Mouth/Throat: Oropharynx is clear and moist. No oropharyngeal exudate.  Nasal congestion and turbinate swelling right greater than left  Eyes: Pupils are equal, round, and reactive to light. Conjunctivae and EOM are normal. Right eye exhibits no discharge. Left eye exhibits no discharge. No scleral icterus.  Eye exam is coming up soon.  Neck: Normal range of motion. Neck supple. No thyromegaly present.  No bruits thyromegaly or anterior cervical adenopathy  Cardiovascular: Normal rate, regular rhythm, normal heart sounds and intact distal pulses.  No murmur heard. Heart is regular at 72/min and no murmurs were present.  Pulmonary/Chest: Effort normal and breath sounds normal. No respiratory distress. She has no wheezes. She has no rales.  Clear anteriorly and posteriorly with no wheezes or rhonchi  Abdominal: Soft. Bowel sounds are normal. She exhibits no mass. There is tenderness. There is no rebound and no guarding.  Slight epigastric tenderness without liver or spleen enlargement bruits or organ enlargement.  Musculoskeletal: Normal range of motion. She exhibits tenderness. She exhibits no edema or deformity.  Muscles in general and especially those that have nodules present are especially tender.  Lymphadenopathy:    She has no cervical adenopathy.  Neurological: She is alert and oriented to person, place, and time. She has normal reflexes. No cranial nerve deficit.  Reflexes are equal bilaterally  Skin: Skin is warm and dry. No rash noted.  Multiple nodules in legs and arms are present in all of these are tender to palpation.  Patient indicates that she had a biopsy of 1 of these in the past and everything was negative.  Psychiatric: She has a normal mood and affect. Her behavior is normal.  Judgment and thought content normal.  Nursing note and vitals reviewed.   BP 127/85 (BP Location: Left Arm)   Pulse 74   Temp (!) 97.5 F (36.4 C) (Oral)   Ht 5' 10"  (1.778 m)   Wt 230 lb (104.3 kg)   BMI 33.00 kg/m        Assessment & Plan:  1. Vitamin D deficiency -Continue with vitamin D replacement pending results of lab work - CBC with Differential/Platelet - VITAMIN D 25 Hydroxy (Vit-D Deficiency, Fractures)  2. Gastroesophageal reflux disease with esophagitis -Continue as needed as needed ranitidine twice daily and avoid irritating foods especially those with caffeine. - CBC with Differential/Platelet - Hepatic function panel  3. Paroxysmal atrial fibrillation (HCC) -Follow-up with cardiology as planned - CBC with Differential/Platelet  4. HYPERTENSION, BENIGN -Blood pressure is good today and continue with current treatment - CBC with Differential/Platelet - BMP8+EGFR - Hepatic function panel  5. Pure hypercholesterolemia -Continue with aggressive therapeutic lifestyle changes - CBC with Differential/Platelet - Lipid panel  6. Fibromyalgia -  Take Tylenol for pain  7. Multiple drug allergies -Avoid and be careful with new medicines as much as possible. -Keep diary and see if there is certain food allergens that seem to be making the shortness of breath worse and report this back to me we can arrange for you to see an allergy specialist for further evaluation.  8. Generalized skin lumps -A referral was offered but patient refused to see a surgeon because she says she is always had these and nothing has ever come from past evaluations.  We can certainly do this referral if she decides or changes her mind about this.  Patient Instructions                       Medicare Annual Wellness Visit  [facility] and the medical providers at [facility] strive to bring you the best medical care.  In doing so we not only want to address your  current medical conditions and concerns but also to detect new conditions early and prevent illness, disease and health-related problems.    Medicare offers a yearly Wellness Visit which allows our clinical staff to assess your need for preventative services including immunizations, lifestyle education, counseling to decrease risk of preventable diseases and screening for fall risk and other medical concerns.    This visit is provided free of charge (no copay) for all Medicare recipients. The clinical pharmacists at [facility] have begun to conduct these Wellness Visits which will also include a thorough review of all your medications.    As you primary medical provider recommend that you make an appointment for your Annual Wellness Visit if you have not done so already this year.  You may set up this appointment before you leave today or you may call back ([phone]) and schedule an appointment.  Please make sure when you call that you mention that you are scheduling your Annual Wellness Visit with the clinical pharmacist so that the appointment may be made for the proper length of time.     Continue current medications. Continue good therapeutic lifestyle changes which include good diet and exercise. Fall precautions discussed with patient. If an FOBT was given today- please return it to our front desk. If you are over 83 years old - you may need Prevnar 20 or the adult Pneumonia vaccine.  **Flu shots are available--- please call and schedule a FLU-CLINIC appointment**  After your visit with [place] today you will receive a survey in the mail or online from [employer] regarding your care with [place]. Please take a moment to fill this out. Your feedback is very important to [place] as you can help [place] better understand your patient needs as well as improve your experience and satisfaction. WE CARE ABOUT YOU!!!   Keep a good dietary history to see see if we can correlate any of your  shortness of breath symptoms with foods especially those that have food dye in them Continue to drink plenty of water and avoid caffeine Follow-up with cardiology as planned Reduce sugar intake as much as possible  Arrie Senate MD

## 2017-06-13 LAB — LIPID PANEL
Chol/HDL Ratio: 4.7 ratio — ABNORMAL HIGH (ref 0.0–4.4)
Cholesterol, Total: 179 mg/dL (ref 100–199)
HDL: 38 mg/dL — AB (ref >39)
LDL Calculated: 115 mg/dL — ABNORMAL HIGH (ref 0–99)
TRIGLYCERIDES: 132 mg/dL (ref 0–149)
VLDL Cholesterol Cal: 26 mg/dL (ref 5–40)

## 2017-06-13 LAB — CBC WITH DIFFERENTIAL/PLATELET
BASOS ABS: 0 10*3/uL (ref 0.0–0.2)
Basos: 1 %
EOS (ABSOLUTE): 0.1 10*3/uL (ref 0.0–0.4)
EOS: 1 %
HEMATOCRIT: 43.4 % (ref 34.0–46.6)
HEMOGLOBIN: 14.6 g/dL (ref 11.1–15.9)
IMMATURE GRANULOCYTES: 0 %
Immature Grans (Abs): 0 10*3/uL (ref 0.0–0.1)
LYMPHS ABS: 1.6 10*3/uL (ref 0.7–3.1)
Lymphs: 29 %
MCH: 28.8 pg (ref 26.6–33.0)
MCHC: 33.6 g/dL (ref 31.5–35.7)
MCV: 86 fL (ref 79–97)
MONOCYTES: 10 %
MONOS ABS: 0.6 10*3/uL (ref 0.1–0.9)
Neutrophils Absolute: 3.3 10*3/uL (ref 1.4–7.0)
Neutrophils: 59 %
Platelets: 240 10*3/uL (ref 150–379)
RBC: 5.07 x10E6/uL (ref 3.77–5.28)
RDW: 13.7 % (ref 12.3–15.4)
WBC: 5.6 10*3/uL (ref 3.4–10.8)

## 2017-06-13 LAB — BMP8+EGFR
BUN/Creatinine Ratio: 13 (ref 12–28)
BUN: 14 mg/dL (ref 8–27)
CALCIUM: 9.2 mg/dL (ref 8.7–10.3)
CO2: 26 mmol/L (ref 20–29)
Chloride: 101 mmol/L (ref 96–106)
Creatinine, Ser: 1.11 mg/dL — ABNORMAL HIGH (ref 0.57–1.00)
GFR calc Af Amer: 58 mL/min/{1.73_m2} — ABNORMAL LOW (ref >59)
GFR, EST NON AFRICAN AMERICAN: 50 mL/min/{1.73_m2} — AB (ref >59)
GLUCOSE: 103 mg/dL — AB (ref 65–99)
POTASSIUM: 3.9 mmol/L (ref 3.5–5.2)
SODIUM: 142 mmol/L (ref 134–144)

## 2017-06-13 LAB — HEPATIC FUNCTION PANEL
ALBUMIN: 3.8 g/dL (ref 3.5–4.8)
ALT: 38 IU/L — AB (ref 0–32)
AST: 32 IU/L (ref 0–40)
Alkaline Phosphatase: 88 IU/L (ref 39–117)
BILIRUBIN TOTAL: 0.8 mg/dL (ref 0.0–1.2)
BILIRUBIN, DIRECT: 0.2 mg/dL (ref 0.00–0.40)
Total Protein: 6.7 g/dL (ref 6.0–8.5)

## 2017-06-13 LAB — VITAMIN D 25 HYDROXY (VIT D DEFICIENCY, FRACTURES): Vit D, 25-Hydroxy: 25.9 ng/mL — ABNORMAL LOW (ref 30.0–100.0)

## 2017-07-10 DIAGNOSIS — Z0289 Encounter for other administrative examinations: Secondary | ICD-10-CM

## 2017-09-30 ENCOUNTER — Emergency Department (HOSPITAL_COMMUNITY)
Admission: EM | Admit: 2017-09-30 | Discharge: 2017-09-30 | Disposition: A | Discharge status: Home / Self Care | Payer: Medicare HMO | Attending: Emergency Medicine | Admitting: Emergency Medicine

## 2017-09-30 ENCOUNTER — Other Ambulatory Visit: Payer: Self-pay

## 2017-09-30 ENCOUNTER — Encounter (HOSPITAL_COMMUNITY): Payer: Self-pay | Admitting: Emergency Medicine

## 2017-09-30 ENCOUNTER — Emergency Department (HOSPITAL_COMMUNITY): Payer: Medicare HMO

## 2017-09-30 DIAGNOSIS — E860 Dehydration: Secondary | ICD-10-CM | POA: Diagnosis not present

## 2017-09-30 DIAGNOSIS — I129 Hypertensive chronic kidney disease with stage 1 through stage 4 chronic kidney disease, or unspecified chronic kidney disease: Secondary | ICD-10-CM | POA: Insufficient documentation

## 2017-09-30 DIAGNOSIS — I4891 Unspecified atrial fibrillation: Secondary | ICD-10-CM | POA: Diagnosis not present

## 2017-09-30 DIAGNOSIS — Z79899 Other long term (current) drug therapy: Secondary | ICD-10-CM | POA: Insufficient documentation

## 2017-09-30 DIAGNOSIS — R0602 Shortness of breath: Secondary | ICD-10-CM | POA: Diagnosis not present

## 2017-09-30 DIAGNOSIS — N183 Chronic kidney disease, stage 3 (moderate): Secondary | ICD-10-CM | POA: Diagnosis not present

## 2017-09-30 DIAGNOSIS — R002 Palpitations: Secondary | ICD-10-CM | POA: Insufficient documentation

## 2017-09-30 LAB — CBC
HCT: 47.2 % — ABNORMAL HIGH (ref 36.0–46.0)
HEMOGLOBIN: 15.7 g/dL — AB (ref 12.0–15.0)
MCH: 28.7 pg (ref 26.0–34.0)
MCHC: 33.3 g/dL (ref 30.0–36.0)
MCV: 86.3 fL (ref 78.0–100.0)
Platelets: 228 10*3/uL (ref 150–400)
RBC: 5.47 MIL/uL — AB (ref 3.87–5.11)
RDW: 13 % (ref 11.5–15.5)
WBC: 7.2 10*3/uL (ref 4.0–10.5)

## 2017-09-30 LAB — TROPONIN I

## 2017-09-30 LAB — BASIC METABOLIC PANEL
ANION GAP: 11 (ref 5–15)
BUN: 16 mg/dL (ref 8–23)
CO2: 25 mmol/L (ref 22–32)
Calcium: 9.2 mg/dL (ref 8.9–10.3)
Chloride: 106 mmol/L (ref 98–111)
Creatinine, Ser: 1.2 mg/dL — ABNORMAL HIGH (ref 0.44–1.00)
GFR, EST AFRICAN AMERICAN: 52 mL/min — AB (ref >60)
GFR, EST NON AFRICAN AMERICAN: 45 mL/min — AB (ref >60)
Glucose, Bld: 157 mg/dL — ABNORMAL HIGH (ref 70–99)
POTASSIUM: 5.1 mmol/L (ref 3.5–5.1)
Sodium: 142 mmol/L (ref 135–145)

## 2017-09-30 LAB — MAGNESIUM: Magnesium: 2.1 mg/dL (ref 1.7–2.4)

## 2017-09-30 MED ORDER — SODIUM CHLORIDE 0.9 % IV BOLUS
1000.0000 mL | Freq: Once | INTRAVENOUS | Status: AC
  Administered 2017-09-30: 1000 mL via INTRAVENOUS

## 2017-09-30 NOTE — ED Triage Notes (Signed)
Pt states she woke up in "AFIB".  States her "chest has that feeling" and she is weak when she tries to walk.

## 2017-09-30 NOTE — ED Notes (Signed)
Pt ambulated well around POD D pt stated she was feeling fine no dizziness no light headedness.  Pt kept a steady pace when walking.

## 2017-09-30 NOTE — ED Provider Notes (Signed)
[facility] EMERGENCY DEPARTMENT Provider Note   CSN: [id number] Arrival date & time: 09/30/17  0501     History   Chief Complaint Chief Complaint  Patient presents with  . Atrial Fibrillation    HPI Alison Garrett is a 72 y.o. female.  HPI   72 year old female with past medical history of A. fib, intermittent, here with palpitations.  The patient states that just prior to arrival, she turned over in bed and felt acute onset of palpitations and shortness of breath.  She has a history of atrial fibrillation and knows every time she goes into A. fib.  She states she has felt somewhat tired for the last several days after being out in the heat all day.  She also drink caffeine yesterday for the first time in several years.  She states that her symptoms have now resolved.  She denies any associated chest pain.  She has been on multiple medications in the past that she has not tolerated so she just treats her A. fib symptomatically and as needed.  Denies any recent fevers, chills, or infectious symptoms.  She has not missed or started any new medications.  Past Medical History:  Diagnosis Date  . Afib ([place])   . Dyslipidemia   . Essential tremor 01/12/2014  . Fibromyalgia    Not currently under active treatment  . GERD (gastroesophageal reflux disease)   . Hiatal hernia   . Hypertension   . Obesity     Patient Active Problem List   Diagnosis Date Noted  . SOB (shortness of breath) 05/22/2017  . Chest pain 07/01/2016  . Paresthesias 06/07/2015  . Chronic kidney disease, stage 3 ([place]) 11/17/2014  . Atrial fibrillation with RVR ([place]) 11/16/2014  . Multiple drug allergies 08/11/2014  . Fatigue 01/12/2014  . Essential tremor 01/12/2014  . Metabolic syndrome 64/33/2951  . Hyperlipemia 09/17/2012  . Vitamin D deficiency 09/17/2012  . Fibromyalgia 09/17/2012  . Atrial fibrillation ([place]) 07/11/2011  . OBESITY, UNSPECIFIED 12/01/2008  . HYPERTENSION, BENIGN  12/01/2008  . ABNORMAL ELECTROCARDIOGRAM 12/01/2008    Past Surgical History:  Procedure Laterality Date  . ABDOMINAL HYSTERECTOMY       OB History   None      Home Medications    Prior to Admission medications   Medication Sig Start Date End Date Taking? Authorizing Provider  hydrochlorothiazide (HYDRODIURIL) 25 MG tablet Take 0.5 tablets (12.5 mg total) by mouth daily. 01/14/17  Yes Chipper Herb, MD    Family History Family History  Problem Relation Age of Onset  . Coronary artery disease Mother   . Heart disease Mother        CABG  . Cancer Mother   . COPD Father   . Hepatitis Sister        autoimune  . Diabetes Sister     Social History Social History   Tobacco Use  . Smoking status: Never Smoker  . Smokeless tobacco: Never Used  Substance Use Topics  . Alcohol use: No    Alcohol/week: 0.0 standard drinks  . Drug use: No     Allergies   Contrast media [iodinated diagnostic agents]; Diltiazem; Penicillins; Sulfonamide derivatives; Benicar [olmesartan]; Celebrex [celecoxib]; Cymbalta [duloxetine hcl]; Latex; Neurontin [gabapentin]; Norvasc [amlodipine besylate]; Prozac [fluoxetine hcl]; Xanax [alprazolam]; Zoloft [sertraline hcl]; and Zyprexa [olanzapine]   Review of Systems Review of Systems  Constitutional: Negative for chills, fatigue and fever.  HENT: Negative for congestion and rhinorrhea.   Eyes: Negative for  visual disturbance.  Respiratory: Positive for shortness of breath. Negative for cough and wheezing.   Cardiovascular: Positive for palpitations. Negative for chest pain and leg swelling.  Gastrointestinal: Negative for abdominal pain, diarrhea, nausea and vomiting.  Genitourinary: Negative for dysuria and flank pain.  Musculoskeletal: Negative for neck pain and neck stiffness.  Skin: Negative for rash and wound.  Allergic/Immunologic: Negative for immunocompromised state.  Neurological: Negative for syncope, weakness and headaches.  All  other systems reviewed and are negative.    Physical Exam Updated Vital Signs BP 127/82   Pulse 65   Temp 98 F (36.7 C) (Oral)   Resp 16   Ht 5\' 10"  (1.778 m)   Wt 99.8 kg   SpO2 95%   BMI 31.57 kg/m   Physical Exam  Constitutional: She is oriented to person, place, and time. She appears well-developed and well-nourished. No distress.  HENT:  Head: Normocephalic and atraumatic.  Eyes: Conjunctivae are normal.  Neck: Neck supple.  Cardiovascular: Normal rate, regular rhythm and normal heart sounds. Exam reveals no friction rub.  No murmur heard. Pulmonary/Chest: Effort normal and breath sounds normal. No respiratory distress. She has no wheezes. She has no rales.  Abdominal: She exhibits no distension.  Musculoskeletal: She exhibits no edema.  Neurological: She is alert and oriented to person, place, and time. She exhibits normal muscle tone.  Skin: Skin is warm. Capillary refill takes less than 2 seconds.  Psychiatric: She has a normal mood and affect.  Nursing note and vitals reviewed.    ED Treatments / Results  Labs (all labs ordered are listed, but only abnormal results are displayed) Labs Reviewed  BASIC METABOLIC PANEL - Abnormal; Notable for the following components:      Result Value   Glucose, Bld 157 (*)    Creatinine, Ser 1.20 (*)    GFR calc non Af Amer 45 (*)    GFR calc Af Amer 52 (*)    All other components within normal limits  CBC - Abnormal; Notable for the following components:   RBC 5.47 (*)    Hemoglobin 15.7 (*)    HCT 47.2 (*)    All other components within normal limits  MAGNESIUM  TROPONIN I    EKG EKG Interpretation  Date/Time:  Monday September 30 2017 06:38:35 EDT Ventricular Rate:  71 PR Interval:    QRS Duration: 96 QT Interval:  398 QTC Calculation: 433 R Axis:   56 Text Interpretation:  Sinus rhythm No significant change since last tracing Confirmed by Duffy Bruce [phone]) on 09/30/2017 6:41:49 AM Also confirmed by  Duffy Bruce [phone]), editor Lynder Parents [phone])  on 09/30/2017 9:09:11 AM   Radiology Dg Chest 2 View  Result Date: 09/30/2017 CLINICAL DATA:  Atrial fibrillation.  Weakness. EXAM: CHEST - 2 VIEW COMPARISON:  04/15/2017 FINDINGS: The heart size and mediastinal contours are within normal limits. Both lungs are clear. The visualized skeletal structures are unremarkable. IMPRESSION: No active cardiopulmonary disease. Electronically Signed   By: Lucienne Capers M.D.   On: 09/30/2017 05:59    Procedures Procedures (including critical care time)  Medications Ordered in ED Medications  sodium chloride 0.9 % bolus 1,000 mL (0 mLs Intravenous Stopped 09/30/17 0821)     Initial Impression / Assessment and Plan / ED Course  I have reviewed the triage vital signs and the nursing notes.  Pertinent labs & imaging results that were available during my care of the patient were reviewed by me and considered in my  medical decision making (see chart for details).     72 yo F here with transient palpitations, suspect episode of pAFib in setting of dehydration and caffeine use. She is in NSR here with no ectopy or arrhythmia on tele. Lab work is reassuring with baseline Hgb, renal function, lytes wnl. She is possibly mildly hemoconcentrated and has been given IVF. Pt remains HDS, well appearing, asymptomatic here. She is ambulatory w/o difficulty. Will d/c with outpt follow-up, encourage hydration and avoidance of caffeine.  Final Clinical Impressions(s) / ED Diagnoses   Final diagnoses:  Dehydration  Palpitations    ED Discharge Orders    None       Duffy Bruce, MD 09/30/17 1447

## 2017-09-30 NOTE — Discharge Instructions (Addendum)
Drink at least 6 to 8 glasses of water per day for the next several days  Try your best to avoid caffeine  Avoid excessive heat

## 2017-11-05 ENCOUNTER — Emergency Department (HOSPITAL_COMMUNITY): Payer: Medicare HMO

## 2017-11-05 ENCOUNTER — Encounter (HOSPITAL_COMMUNITY): Payer: Self-pay

## 2017-11-05 ENCOUNTER — Observation Stay (HOSPITAL_COMMUNITY)
Admission: EM | Admit: 2017-11-05 | Discharge: 2017-11-07 | Disposition: A | Discharge status: Home / Self Care | Payer: Medicare HMO | Attending: Cardiology | Admitting: Cardiology

## 2017-11-05 ENCOUNTER — Other Ambulatory Visit: Payer: Self-pay

## 2017-11-05 DIAGNOSIS — R0789 Other chest pain: Secondary | ICD-10-CM | POA: Diagnosis not present

## 2017-11-05 DIAGNOSIS — E785 Hyperlipidemia, unspecified: Secondary | ICD-10-CM | POA: Diagnosis not present

## 2017-11-05 DIAGNOSIS — R079 Chest pain, unspecified: Secondary | ICD-10-CM | POA: Diagnosis not present

## 2017-11-05 DIAGNOSIS — N183 Chronic kidney disease, stage 3 (moderate): Secondary | ICD-10-CM | POA: Diagnosis not present

## 2017-11-05 DIAGNOSIS — E669 Obesity, unspecified: Secondary | ICD-10-CM | POA: Diagnosis not present

## 2017-11-05 DIAGNOSIS — I4891 Unspecified atrial fibrillation: Secondary | ICD-10-CM | POA: Diagnosis not present

## 2017-11-05 DIAGNOSIS — I2699 Other pulmonary embolism without acute cor pulmonale: Secondary | ICD-10-CM | POA: Diagnosis not present

## 2017-11-05 DIAGNOSIS — M797 Fibromyalgia: Secondary | ICD-10-CM | POA: Diagnosis present

## 2017-11-05 DIAGNOSIS — Z91040 Latex allergy status: Secondary | ICD-10-CM | POA: Diagnosis not present

## 2017-11-05 DIAGNOSIS — R0609 Other forms of dyspnea: Secondary | ICD-10-CM | POA: Diagnosis not present

## 2017-11-05 DIAGNOSIS — I129 Hypertensive chronic kidney disease with stage 1 through stage 4 chronic kidney disease, or unspecified chronic kidney disease: Secondary | ICD-10-CM | POA: Diagnosis not present

## 2017-11-05 DIAGNOSIS — I200 Unstable angina: Secondary | ICD-10-CM | POA: Diagnosis not present

## 2017-11-05 DIAGNOSIS — I480 Paroxysmal atrial fibrillation: Secondary | ICD-10-CM | POA: Diagnosis present

## 2017-11-05 DIAGNOSIS — E66811 Obesity, class 1: Secondary | ICD-10-CM | POA: Diagnosis present

## 2017-11-05 DIAGNOSIS — I10 Essential (primary) hypertension: Secondary | ICD-10-CM | POA: Diagnosis present

## 2017-11-05 HISTORY — DX: Other complications of anesthesia, initial encounter: T88.59XA

## 2017-11-05 HISTORY — DX: Adverse effect of unspecified anesthetic, initial encounter: T41.45XA

## 2017-11-05 LAB — BASIC METABOLIC PANEL
Anion gap: 9 (ref 5–15)
BUN: 17 mg/dL (ref 8–23)
CO2: 27 mmol/L (ref 22–32)
Calcium: 9.4 mg/dL (ref 8.9–10.3)
Chloride: 104 mmol/L (ref 98–111)
Creatinine, Ser: 1.17 mg/dL — ABNORMAL HIGH (ref 0.44–1.00)
GFR calc Af Amer: 53 mL/min — ABNORMAL LOW (ref >60)
GFR calc non Af Amer: 46 mL/min — ABNORMAL LOW (ref >60)
Glucose, Bld: 137 mg/dL — ABNORMAL HIGH (ref 70–99)
POTASSIUM: 4.1 mmol/L (ref 3.5–5.1)
Sodium: 140 mmol/L (ref 135–145)

## 2017-11-05 LAB — TROPONIN I
Troponin I: <0.03 ng/mL (ref <0.03)
Troponin I: <0.03 ng/mL (ref <0.03)

## 2017-11-05 LAB — CBC
HEMATOCRIT: 45.7 % (ref 36.0–46.0)
HEMOGLOBIN: 15.2 g/dL — AB (ref 12.0–15.0)
MCH: 29.2 pg (ref 26.0–34.0)
MCHC: 33.3 g/dL (ref 30.0–36.0)
MCV: 87.9 fL (ref 78.0–100.0)
Platelets: 227 10*3/uL (ref 150–400)
RBC: 5.2 MIL/uL — ABNORMAL HIGH (ref 3.87–5.11)
RDW: 13 % (ref 11.5–15.5)
WBC: 7 10*3/uL (ref 4.0–10.5)

## 2017-11-05 LAB — I-STAT TROPONIN, ED: Troponin i, poc: 0 ng/mL (ref 0.00–0.08)

## 2017-11-05 LAB — BRAIN NATRIURETIC PEPTIDE: B NATRIURETIC PEPTIDE 5: 29.4 pg/mL (ref 0.0–100.0)

## 2017-11-05 MED ORDER — ONDANSETRON HCL 4 MG/2ML IJ SOLN: 4.0000 mg | Freq: Four times a day (QID) | INTRAMUSCULAR | Status: DC | PRN

## 2017-11-05 MED ORDER — ACETAMINOPHEN 325 MG PO TABS: 650.0000 mg | ORAL_TABLET | ORAL | Status: DC | PRN

## 2017-11-05 MED ORDER — NITROGLYCERIN 0.4 MG SL SUBL: 0.4000 mg | SUBLINGUAL_TABLET | SUBLINGUAL | Status: DC | PRN

## 2017-11-05 MED ORDER — HEPARIN SODIUM (PORCINE) 5000 UNIT/ML IJ SOLN
5000.0000 [IU] | Freq: Three times a day (TID) | INTRAMUSCULAR | Status: DC
  Administered 2017-11-05 – 2017-11-06 (×2): 5000 [IU] via SUBCUTANEOUS
  Filled 2017-11-05 (×2): qty 1

## 2017-11-05 MED ORDER — PREDNISONE 20 MG PO TABS
50.0000 mg | ORAL_TABLET | Freq: Four times a day (QID) | ORAL | Status: AC
  Administered 2017-11-05 – 2017-11-06 (×3): 50 mg via ORAL
  Filled 2017-11-05 (×3): qty 1

## 2017-11-05 MED ORDER — DIPHENHYDRAMINE HCL 25 MG PO CAPS
50.0000 mg | ORAL_CAPSULE | Freq: Once | ORAL | Status: AC
  Administered 2017-11-06: 50 mg via ORAL
  Filled 2017-11-05: qty 2

## 2017-11-05 MED ORDER — HYDROCHLOROTHIAZIDE 25 MG PO TABS: 12.5000 mg | ORAL_TABLET | Freq: Every day | ORAL | Status: DC

## 2017-11-05 MED ORDER — HYDROCHLOROTHIAZIDE 25 MG PO TABS
12.5000 mg | ORAL_TABLET | Freq: Every day | ORAL | Status: DC
  Administered 2017-11-05 – 2017-11-07 (×2): 12.5 mg via ORAL
  Filled 2017-11-05 (×3): qty 1

## 2017-11-05 MED ORDER — HEPARIN SODIUM (PORCINE) 5000 UNIT/ML IJ SOLN: 5000.0000 [IU] | Freq: Three times a day (TID) | INTRAMUSCULAR | Status: DC

## 2017-11-05 NOTE — ED Notes (Signed)
CT called an reported that the pt will not be going in for CT due to an allergy. CT will be performed after the pt is admitted.

## 2017-11-05 NOTE — ED Triage Notes (Signed)
Patient here for chest pain and shortness of breath for the last 2 weeks.  Said every time she does any extraneous work the pain comes back.  A&Ox4.

## 2017-11-05 NOTE — ED Notes (Signed)
Meal given to pt. [place] sandwich with two cokes.

## 2017-11-05 NOTE — ED Provider Notes (Addendum)
[facility] EMERGENCY DEPARTMENT Provider Note   CSN: [id number] Arrival date & time: 11/05/17  0235     History   Chief Complaint Chief Complaint  Patient presents with  . Chest Pain  . Shortness of Breath    HPI Alison Garrett is a 72 y.o. female.  HPI Patient with multiple medical issues including hypertension, fibromyalgia, paroxysmal atrial fibrillation presents with new exertional chest pain and dyspnea. Symptoms began a few weeks ago, has become worse since onset. She notes that with anything beyond typical walking she feels increasingly dyspneic, with chest pressure, related to the amount of exertion. Symptoms improve at rest. Currently she is asymptomatic, denies complaints, but with more severe episodes in the past few hours she presents for evaluation. She denies recent medication change, diet change, activity change. She has had prior stress testing, but not cardiac catheterization.  Past Medical History:  Diagnosis Date  . Afib ([place])   . Dyslipidemia   . Essential tremor 01/12/2014  . Fibromyalgia    Not currently under active treatment  . GERD (gastroesophageal reflux disease)   . Hiatal hernia   . Hypertension   . Obesity     Patient Active Problem List   Diagnosis Date Noted  . SOB (shortness of breath) 05/22/2017  . Chest pain 07/01/2016  . Paresthesias 06/07/2015  . Chronic kidney disease, stage 3 ([place]) 11/17/2014  . Atrial fibrillation with RVR ([place]) 11/16/2014  . Multiple drug allergies 08/11/2014  . Fatigue 01/12/2014  . Essential tremor 01/12/2014  . Metabolic syndrome 45/62/5638  . Hyperlipemia 09/17/2012  . Vitamin D deficiency 09/17/2012  . Fibromyalgia 09/17/2012  . Atrial fibrillation ([place]) 07/11/2011  . OBESITY, UNSPECIFIED 12/01/2008  . HYPERTENSION, BENIGN 12/01/2008  . ABNORMAL ELECTROCARDIOGRAM 12/01/2008    Past Surgical History:  Procedure Laterality Date  . ABDOMINAL HYSTERECTOMY       OB History     None      Home Medications    Prior to Admission medications   Medication Sig Start Date End Date Taking? Authorizing Provider  hydrochlorothiazide (HYDRODIURIL) 25 MG tablet Take 0.5 tablets (12.5 mg total) by mouth daily. 01/14/17   Chipper Herb, MD    Family History Family History  Problem Relation Age of Onset  . Coronary artery disease Mother   . Heart disease Mother        CABG  . Cancer Mother   . COPD Father   . Hepatitis Sister        autoimune  . Diabetes Sister     Social History Social History   Tobacco Use  . Smoking status: Never Smoker  . Smokeless tobacco: Never Used  Substance Use Topics  . Alcohol use: No    Alcohol/week: 0.0 standard drinks  . Drug use: No     Allergies   Contrast media [iodinated diagnostic agents]; Diltiazem; Penicillins; Sulfonamide derivatives; Benicar [olmesartan]; Celebrex [celecoxib]; Cymbalta [duloxetine hcl]; Latex; Neurontin [gabapentin]; Norvasc [amlodipine besylate]; Prozac [fluoxetine hcl]; Xanax [alprazolam]; Zoloft [sertraline hcl]; and Zyprexa [olanzapine]   Review of Systems Review of Systems  Constitutional:       Per HPI, otherwise negative  HENT:       Per HPI, otherwise negative  Respiratory:       Per HPI, otherwise negative  Cardiovascular:       Per HPI, otherwise negative  Gastrointestinal: Negative for vomiting.  Endocrine:       Negative aside from HPI  Genitourinary:  Neg aside from HPI   Musculoskeletal:       Per HPI, otherwise negative  Skin: Negative.   Neurological: Negative for syncope.     Physical Exam Updated Vital Signs BP 136/80   Pulse 63   Temp 97.9 F (36.6 C) (Oral)   Resp 15   SpO2 92%   Physical Exam  Constitutional: She is oriented to person, place, and time. She appears well-developed and well-nourished. No distress.  HENT:  Head: Normocephalic and atraumatic.  Eyes: Conjunctivae and EOM are normal.  Cardiovascular: Normal rate and regular rhythm.   Pulmonary/Chest: Effort normal and breath sounds normal. No stridor. No respiratory distress.  Abdominal: She exhibits no distension.  Musculoskeletal: She exhibits no edema.  Neurological: She is alert and oriented to person, place, and time. No cranial nerve deficit.  Skin: Skin is warm and dry.  Psychiatric: She has a normal mood and affect.  Nursing note and vitals reviewed.    ED Treatments / Results  Labs (all labs ordered are listed, but only abnormal results are displayed) Labs Reviewed  BASIC METABOLIC PANEL - Abnormal; Notable for the following components:      Result Value   Glucose, Bld 137 (*)    Creatinine, Ser 1.17 (*)    GFR calc non Af Amer 46 (*)    GFR calc Af Amer 53 (*)    All other components within normal limits  CBC - Abnormal; Notable for the following components:   RBC 5.20 (*)    Hemoglobin 15.2 (*)    All other components within normal limits  BRAIN NATRIURETIC PEPTIDE  TROPONIN I  I-STAT TROPONIN, ED    EKG EKG Interpretation  Date/Time:  Tuesday November 05 2017 02:57:06 EDT Ventricular Rate:  76 PR Interval:  204 QRS Duration: 88 QT Interval:  384 QTC Calculation: 432 R Axis:   50 Text Interpretation:  Normal sinus rhythm Normal ECG When compared with ECG of 09/30/2017, No significant change was found Confirmed by Delora Fuel ([id number]) on 11/05/2017 7:12:29 AM   Radiology Dg Chest 2 View  Result Date: 11/05/2017 CLINICAL DATA:  72 year old female with chest pain. EXAM: CHEST - 2 VIEW COMPARISON:  Chest radiograph dated 09/30/2017 FINDINGS: The lungs are clear. There is no pleural effusion or pneumothorax. The cardiac silhouette is within normal limits. Lower thoracic degenerative changes. IMPRESSION: No active cardiopulmonary disease. Electronically Signed   By: Anner Crete M.D.   On: 11/05/2017 03:52    Procedures Procedures (including critical care time)    Initial Impression / Assessment and Plan / ED Course  I have  reviewed the triage vital signs and the nursing notes.  Pertinent labs & imaging results that were available during my care of the patient were reviewed by me and considered in my medical decision making (see chart for details).     Elderly female with paroxysmal atrial fibrillation, hypertension presents with new dyspnea on exertion, chest pain as well. At rest, currently, the patient is asymptomatic, but given her description of new symptoms, there is suspicion for angina, new, unstable. Initial studies reassuring, but with these concerns I discussed patient's case with her cardiology colleagues, her care team, and she was admitted for further evaluation and management. No other early evidence for concurrent disease including infection, PE, or substantial letter light abnormalities.    Final Clinical Impressions(s) / ED Diagnoses  Atypical chest pain   Carmin Muskrat, MD 11/05/17 1220    Carmin Muskrat, MD 11/05/17 1221

## 2017-11-05 NOTE — H&P (Addendum)
Cardiology Admission History and Physical:   Patient ID: [name] MRN: [id number]; DOB: 11/09/1945   Admission date: 11/05/2017  Primary Care Provider: Chipper Herb, MD Primary Cardiologist: Dr. Percival Spanish   Chief Complaint: Chest pain and shortness of breath   Patient Profile:   Alison Garrett is a 72 y.o. female with paroxysmal atrial fibrillation (she has declined anticoagulation in past) and hypertension presented for evaluation of chest pain and shortness of breath.  Remote history of intermittent palpitation due to atrial fibrillation.  She can tell when she goes in atrial fibrillation.  She has declined anticoagulation in past and she knows the risk of stroke.  History of Present Illness:   [name] presented for evaluation of shortness of breath and chest pain.  Patient had a sudden onset substernal chest pressure along with shortness of breath while helping her husband load 50 pound of bag.  She might had associated palpitation.  No radiation, nausea or vomiting.  Symptom lasted for about 5 to 10 minutes and eventually resolved.  However never felt better.  She continues to have intermittent shortness of breath and chest pain leading to ER evaluation.  She was seen in ER last month for palpitation.  Felt dehydrated.  She does drink caffeine soda in 10 years and noted palpitation.  Felt like A. fib.  No recurrent symptoms.  He denies orthopnea, PND, syncope, lower extremity edema or melena.  No slurred speech or one-sided weakness.  He has noted recent exertional chest pain and shortness of breath (more) which relieves with rest.  She also has some exertional fatigue at work.  In ER, BNP normal.  Troponin negative.  Electrolyte normal.  Creatinine 1.17.  Hemoglobin stable.  Chest x-ray without acute cardiopulmonary disease.  EKG shows sinus rhythm with nonspecific T wave changes in inferior lead which is similar to prior EKG.   Past Medical History:  Diagnosis Date  . Afib  ([place])   . Dyslipidemia   . Essential tremor 01/12/2014  . Fibromyalgia    Not currently under active treatment  . GERD (gastroesophageal reflux disease)   . Hiatal hernia   . Hypertension   . Obesity     Past Surgical History:  Procedure Laterality Date  . ABDOMINAL HYSTERECTOMY       Medications Prior to Admission: Prior to Admission medications   Medication Sig Start Date End Date Taking? Authorizing Provider  hydrochlorothiazide (HYDRODIURIL) 25 MG tablet Take 0.5 tablets (12.5 mg total) by mouth daily. 01/14/17   Chipper Herb, MD     Allergies:    Allergies  Allergen Reactions  . Contrast Media [Iodinated Diagnostic Agents] Anaphylaxis  . Diltiazem Other (See Comments)    Pt. Had pain, jitters and felt terrible  . Penicillins Anaphylaxis      . Sulfonamide Derivatives Hives  . Benicar [Olmesartan] Rash  . Celebrex [Celecoxib] Anxiety  . Cymbalta [Duloxetine Hcl] Itching  . Latex Rash  . Neurontin [Gabapentin] Nausea Only  . Norvasc [Amlodipine Besylate] Swelling  . Prozac [Fluoxetine Hcl] Other (See Comments)  . Xanax [Alprazolam] Other (See Comments)  . Zoloft [Sertraline Hcl] Itching  . Zyprexa [Olanzapine] Other (See Comments)    Wt gain    Social History:   Social History   Socioeconomic History  . Marital status: Divorced    Spouse name: Not on file  . Number of children: 3  . Years of education: some colle  . Highest education level: Not on file  Occupational History  .  Occupation: Unemployed and disabled  Social Needs  . Financial resource strain: Not on file  . Food insecurity:    Worry: Not on file    Inability: Not on file  . Transportation needs:    Medical: Not on file    Non-medical: Not on file  Tobacco Use  . Smoking status: Never Smoker  . Smokeless tobacco: Never Used  Substance and Sexual Activity  . Alcohol use: No    Alcohol/week: 0.0 standard drinks  . Drug use: No  . Sexual activity: Not on file  Lifestyle  . Physical  activity:    Days per week: Not on file    Minutes per session: Not on file  . Stress: Not on file  Relationships  . Social connections:    Talks on phone: Not on file    Gets together: Not on file    Attends religious service: Not on file    Active member of club or organization: Not on file    Attends meetings of clubs or organizations: Not on file    Relationship status: Not on file  . Intimate partner violence:    Fear of current or ex partner: Not on file    Emotionally abused: Not on file    Physically abused: Not on file    Forced sexual activity: Not on file  Other Topics Concern  . Not on file  Social History Narrative   Lives in [place].   Patient is right handed   Drinks very little caffeine.    Family History:   The patient's family history includes COPD in her father; Cancer in her mother; Coronary artery disease in her mother; Diabetes in her sister; Heart disease in her mother; Hepatitis in her sister.    ROS:  Please see the history of present illness.  All other ROS reviewed and negative.     Physical Exam/Data:   Vitals:   11/05/17 0930 11/05/17 1015 11/05/17 1030 11/05/17 1115  BP: 134/83 131/76 123/81 (!) 148/89  Pulse: (!) 59 60 61 64  Resp: 17 16 14 10   Temp:      TempSrc:      SpO2: 96% 96% 94% 97%   No intake or output data in the 24 hours ending 11/05/17 1200 There were no vitals filed for this visit. There is no height or weight on file to calculate BMI.  General:  Well nourished, well developed, in no acute distress HEENT: normal Lymph: no adenopathy Neck: no JVD Endocrine:  No thryomegaly Vascular: No carotid bruits; FA pulses 2+ bilaterally without bruits  Cardiac:  normal S1, S2; RRR; no murmur  Lungs:  clear to auscultation bilaterally, no wheezing, rhonchi or rales  Abd: soft, nontender, no hepatomegaly  Ext: no edema Musculoskeletal:  No deformities, BUE and BLE strength normal and equal Skin: warm and dry  Neuro:  CNs 2-12  intact, no focal abnormalities noted Psych:  Normal affect     Relevant CV Studies: Ech[facility]ogram 04/2013 at [facility] Normal LV function at 60 to 65%  Laboratory Data:  Chemistry Recent Labs  Lab 11/05/17 0305  NA 140  K 4.1  CL 104  CO2 27  GLUCOSE 137*  BUN 17  CREATININE 1.17*  CALCIUM 9.4  GFRNONAA 46*  GFRAA 53*  ANIONGAP 9    Hematology Recent Labs  Lab 11/05/17 0305  WBC 7.0  RBC 5.20*  HGB 15.2*  HCT 45.7  MCV 87.9  MCH 29.2  MCHC 33.3  RDW 13.0  PLT 227   Cardiac Enzymes Recent Labs  Lab 11/05/17 0833  TROPONINI <0.03    Recent Labs  Lab 11/05/17 0311  TROPIPOC 0.00    BNP Recent Labs  Lab 11/05/17 0833  BNP 29.4    Radiology/Studies:  Dg Chest 2 View  Result Date: 11/05/2017 CLINICAL DATA:  72 year old female with chest pain. EXAM: CHEST - 2 VIEW COMPARISON:  Chest radiograph dated 09/30/2017 FINDINGS: The lungs are clear. There is no pleural effusion or pneumothorax. The cardiac silhouette is within normal limits. Lower thoracic degenerative changes. IMPRESSION: No active cardiopulmonary disease. Electronically Signed   By: Anner Crete M.D.   On: 11/05/2017 03:52    Assessment and Plan:   1.  Chest pain/shortness of breath Her symptoms is concerning for cardiac etiology.  Either angina versus recurrent atrial fibrillation.  Recent exertional limitation.  Troponin negative.  EKG without acute ischemic changes.  2.  Paroxysmal atrial fibrillation -Chadsvas score of 3.  He has declined anticoagulation in past.  Recent worse episode of palpitation last month while evaluated in the ER after drinking caffeinated soda. No reoccurrence.  She will need 30-day event monitor.  3.  Hypertension -Blood pressure relatively stable on hydrochlorothiazide 12.5 mg daily.  4. Presumed CKD stage III - SCr stable around 1.1   Severity of Illness: The appropriate patient status for this patient is OBSERVATION. Observation status is judged to  be reasonable and necessary in order to provide the required intensity of service to ensure the patient's safety. The patient's presenting symptoms, physical exam findings, and initial radiographic and laboratory data in the context of their medical condition is felt to place them at decreased risk for further clinical deterioration. Furthermore, it is anticipated that the patient will be medically stable for discharge from the hospital within 2 midnights of admission. The following factors support the patient status of observation.   " The patient's presenting symptoms include Chest pain . " The physical exam findings include None " The initial radiographic and laboratory data are None     For questions or updates, please contact [place] Please consult [email or web address] for contact info under   Signed, Leanor Kail, PA  11/05/2017 12:00 PM   History and all data above reviewed.  Patient examined.  I agree with the findings as above.  The patient has no past history of CAD.  She had a negative perfusion study last year.  She has PAF.  Her current symptoms happened after she was carrying something heavy last night.  However, she has had similar symptoms on going anytime she carries something heavy recently .  She feels very fatigued.  She had might have pain in her chest when this happens though not as severe as last night.  She does not describe associated symptoms. .  She does not report arm or neck discomfort.  After this happened last night she "just did not feel right"  She has had no objective evidence of ischemia.  EKG and troponins are negative.    The patient exam reveals COR:RRR  ,  Lungs: Clear  ,  Abd: Positive bowel sounds, no rebound no guarding, Ext No edema  .  All available labs, radiology testing, previous records reviewed. Agree with documented assessment and plan.   Chest pain:  Plan observation and coronary CT.  She has a dye allergy and will need prophylaxis.    Jeneen Rinks  Intisar Claudio  12:07 PM  11/05/2017

## 2017-11-06 ENCOUNTER — Observation Stay (HOSPITAL_COMMUNITY): Payer: Medicare HMO

## 2017-11-06 DIAGNOSIS — I2699 Other pulmonary embolism without acute cor pulmonale: Secondary | ICD-10-CM | POA: Diagnosis not present

## 2017-11-06 DIAGNOSIS — R079 Chest pain, unspecified: Secondary | ICD-10-CM | POA: Diagnosis not present

## 2017-11-06 LAB — BASIC METABOLIC PANEL
Anion gap: 9 (ref 5–15)
BUN: 14 mg/dL (ref 8–23)
CALCIUM: 9.1 mg/dL (ref 8.9–10.3)
CO2: 26 mmol/L (ref 22–32)
Chloride: 104 mmol/L (ref 98–111)
Creatinine, Ser: 1.05 mg/dL — ABNORMAL HIGH (ref 0.44–1.00)
GFR calc Af Amer: >60 mL/min (ref >60)
GFR calc non Af Amer: 53 mL/min — ABNORMAL LOW (ref >60)
GLUCOSE: 146 mg/dL — AB (ref 70–99)
Potassium: 3.9 mmol/L (ref 3.5–5.1)
Sodium: 139 mmol/L (ref 135–145)

## 2017-11-06 LAB — HIV ANTIBODY (ROUTINE TESTING W REFLEX): HIV Screen 4th Generation wRfx: NONREACTIVE

## 2017-11-06 LAB — TROPONIN I

## 2017-11-06 MED ORDER — NITROGLYCERIN 0.4 MG SL SUBL
0.8000 mg | SUBLINGUAL_TABLET | Freq: Once | SUBLINGUAL | Status: AC
  Administered 2017-11-06: 0.8 mg via SUBLINGUAL

## 2017-11-06 MED ORDER — APIXABAN 5 MG PO TABS: 5.0000 mg | ORAL_TABLET | Freq: Two times a day (BID) | ORAL | Status: DC

## 2017-11-06 MED ORDER — METOPROLOL TARTRATE 5 MG/5ML IV SOLN
10.0000 mg | INTRAVENOUS | Status: AC | PRN
  Administered 2017-11-06: 10 mg via INTRAVENOUS

## 2017-11-06 MED ORDER — IOPAMIDOL (ISOVUE-370) INJECTION 76%
100.0000 mL | Freq: Once | INTRAVENOUS | Status: AC
  Administered 2017-11-06: 80 mL via INTRAVENOUS

## 2017-11-06 MED ORDER — METOPROLOL TARTRATE 5 MG/5ML IV SOLN
5.0000 mg | INTRAVENOUS | Status: DC | PRN
  Administered 2017-11-06: 5 mg via INTRAVENOUS

## 2017-11-06 MED ORDER — NITROGLYCERIN 0.4 MG SL SUBL
SUBLINGUAL_TABLET | SUBLINGUAL | Status: AC
  Filled 2017-11-06: qty 2

## 2017-11-06 MED ORDER — METOPROLOL TARTRATE 5 MG/5ML IV SOLN
INTRAVENOUS | Status: AC
  Filled 2017-11-06: qty 20

## 2017-11-06 MED ORDER — APIXABAN 5 MG PO TABS
10.0000 mg | ORAL_TABLET | Freq: Two times a day (BID) | ORAL | Status: DC
  Administered 2017-11-06 – 2017-11-07 (×3): 10 mg via ORAL
  Filled 2017-11-06 (×3): qty 2

## 2017-11-06 NOTE — Progress Notes (Signed)
Progress Note  Patient Name: Alison Garrett Date of Encounter: 11/06/2017  Primary Cardiologist:   No primary care provider on file.   Subjective   No chest pain.  No SOB.   Inpatient Medications    Scheduled Meds: . diphenhydrAMINE  50 mg Oral Once  . heparin  5,000 Units Subcutaneous Q8H  . hydrochlorothiazide  12.5 mg Oral Daily  . predniSONE  50 mg Oral Q6H   Continuous Infusions:  PRN Meds: acetaminophen, nitroGLYCERIN, ondansetron (ZOFRAN) IV   Vital Signs    Vitals:   11/05/17 1605 11/05/17 2034 11/06/17 0648 11/06/17 0800  BP: 134/70 121/86 131/81   Pulse: 62 64 69   Resp:  18 18   Temp: 97.7 F (36.5 C) 98.5 F (36.9 C) 98.3 F (36.8 C)   TempSrc: Oral Oral Oral   SpO2: 95% 95% (!) 89% 96%  Weight: 99.5 kg     Height: 5\' 10"  (1.778 m)       Intake/Output Summary (Last 24 hours) at 11/06/2017 0942 Last data filed at 11/06/2017 0800 Gross per 24 hour  Intake -  Output 1 ml  Net -1 ml   Filed Weights   11/05/17 1605  Weight: 99.5 kg    Telemetry    NSR - Personally Reviewed  ECG    NA - Personally Reviewed  Physical Exam   GEN: No acute distress.   Neck: No  JVD Cardiac: RRR, no murmurs, rubs, or gallops.  Respiratory: Clear  to auscultation bilaterally. GI: Soft, nontender, non-distended  MS: No  edema; No deformity. Neuro:  Nonfocal  Psych: Normal affect   Labs    Chemistry Recent Labs  Lab 11/05/17 0305 11/06/17 0234  NA 140 139  K 4.1 3.9  CL 104 104  CO2 27 26  GLUCOSE 137* 146*  BUN 17 14  CREATININE 1.17* 1.05*  CALCIUM 9.4 9.1  GFRNONAA 46* 53*  GFRAA 53* >60  ANIONGAP 9 9     Hematology Recent Labs  Lab 11/05/17 0305  WBC 7.0  RBC 5.20*  HGB 15.2*  HCT 45.7  MCV 87.9  MCH 29.2  MCHC 33.3  RDW 13.0  PLT 227    Cardiac Enzymes Recent Labs  Lab 11/05/17 0833 11/05/17 1543 11/05/17 2103 11/06/17 0234  TROPONINI <0.03 <0.03 <0.03 <0.03    Recent Labs  Lab 11/05/17 0311  TROPIPOC 0.00       BNP Recent Labs  Lab 11/05/17 0833  BNP 29.4     DDimer No results for input(s): DDIMER in the last 168 hours.   Radiology    Dg Chest 2 View  Result Date: 11/05/2017 CLINICAL DATA:  72 year old female with chest pain. EXAM: CHEST - 2 VIEW COMPARISON:  Chest radiograph dated 09/30/2017 FINDINGS: The lungs are clear. There is no pleural effusion or pneumothorax. The cardiac silhouette is within normal limits. Lower thoracic degenerative changes. IMPRESSION: No active cardiopulmonary disease. Electronically Signed   By: Anner Crete M.D.   On: 11/05/2017 03:52    Cardiac Studies   CT Pending  Patient Profile     72 y.o. female with paroxysmal atrial fibrillation (she has declined anticoagulation in past) and hypertension presented for evaluation of chest pain and shortness of breath.  Assessment & Plan    CHEST PAIN:  Enzymes negative.  CT.  If coronaries OK then home today.   ATRIAL FIB:   NSR.  No further treatment.    For questions or updates, please contact [place]  Please consult [email or web address] for contact info under Cardiology/STEMI.   Signed, Minus Breeding, MD  11/06/2017, 9:42 AM

## 2017-11-06 NOTE — Progress Notes (Signed)
ANTICOAGULATION CONSULT NOTE - Initial Consult  Pharmacy Consult for apixaban Indication: pulmonary embolus  Patient Measurements: Height: 5\' 10"  (177.8 cm) Weight: 219 lb 5.7 oz (99.5 kg) IBW/kg (Calculated) : 68.5 Vital Signs: Temp: 98.3 F (36.8 C) (09/25 0648) Temp Source: Oral (09/25 0648) BP: 131/81 (09/25 0648) Pulse Rate: 69 (09/25 0648)  Labs: Recent Labs    11/05/17 0305  11/05/17 1543 11/05/17 2103 11/06/17 0234  HGB 15.2*  --   --   --   --   HCT 45.7  --   --   --   --   PLT 227  --   --   --   --   CREATININE 1.17*  --   --   --  1.05*  TROPONINI  --    < > <0.03 <0.03 <0.03   < > = values in this interval not displayed.    Estimated Creatinine Clearance: 63.7 mL/min (A) (by C-G formula based on SCr of 1.05 mg/dL (H)).   Medical History: Past Medical History:  Diagnosis Date  . Afib ([place])   . Complication of anesthesia    propathal   . Dyslipidemia   . Essential tremor 01/12/2014  . Fibromyalgia    Not currently under active treatment  . GERD (gastroesophageal reflux disease)   . Hiatal hernia   . Hypertension   . Obesity    Assessment: 28 yof with history of PAF not on anticoagulation on admit. Patient was being ruled out for coronary disease with CT scan and a small emboli in the RLL branch of the pulmonary artery was found. New orders received to start apixaban. CBC yesterday was within normal limits.    Goal of Therapy:  Monitor platelets by anticoagulation protocol: Yes   Plan:  Apixaban 10mg  bid x 7 days then 5mg  bid  Erin Hearing PharmD., BCPS Clinical Pharmacist 11/06/2017 1:59 PM

## 2017-11-06 NOTE — Plan of Care (Signed)
  Problem: Education: Goal: Knowledge of General Education information will improve Description Including pain rating scale, medication(s)/side effects and non-pharmacologic comfort measures Outcome: Completed/Met   Problem: Clinical Measurements: Goal: Diagnostic test results will improve Outcome: Completed/Met Goal: Respiratory complications will improve Outcome: Completed/Met

## 2017-11-06 NOTE — Care Management Note (Signed)
Case Management Note  Patient Details  Name: Alison Garrett MRN: [id number] Date of Birth: February 04, 1946  Subjective/Objective: Pt presented for Chest Pain and SOB. Positive for PE- Plan for home on Eliquis. Benefits check in process and will make patient aware of cost once completed. Pt uses [facility] [place] and Eliquis is in stock. PTA Independent from home. No home needs identified at this time.                  Action/Plan: CM will continue to monitor for additional needs. 30 day free card to be provided/ pt may be eligible for [facility].   Expected Discharge Date:                  Expected Discharge Plan:  Home/Self Care  In-House Referral:  NA  Discharge planning Services  CM Consult  Post Acute Care Choice:  NA Choice offered to:  NA  DME Arranged:  N/A DME Agency:  NA  HH Arranged:  NA HH Agency:  NA  Status of Service:  Completed, signed off  If discussed at [place] of Stay Meetings, dates discussed:    Additional Comments:  Bethena Roys, RN 11/06/2017, 2:28 PM

## 2017-11-06 NOTE — Care Management Obs Status (Signed)
[place] NOTIFICATION   Patient Details  Name: Alison Garrett MRN: [id number] Date of Birth: [birth year]   Medicare Observation Status Notification Given:  Yes    Bethena Roys, RN 11/06/2017, 2:00 PM

## 2017-11-06 NOTE — Care Management (Signed)
11-06-12 BENEFIT CHECK :  # 3.  S/W   [facility]  @ [employer] RX # [phone]  ELIQUIS  5 MG BID COVER- YES CO-PAY- $ 45.00 TIER-  3 DRUG PRIOR APPROVAL- YES  # [phone]  FOR EXCEPTION  NO DEDUCTIBLE  PREFERRED PHARMACY : YES [facility]  AND [employer] [facility]

## 2017-11-06 NOTE — Discharge Instructions (Addendum)
Information on my medicine - ELIQUIS (apixaban)  This medication education was reviewed with me or my healthcare representative as part of my discharge preparation.  The pharmacist that spoke with me during my hospital stay was:  Georgina Peer, [facility]  Why was Eliquis prescribed for you? Eliquis was prescribed for you to reduce the risk of forming blood clots that can cause a stroke if you have a medical condition called atrial fibrillation (a type of irregular heartbeat) OR to reduce the risk of a blood clots forming after orthopedic surgery.  What do You need to know about Eliquis ? Take your Eliquis TWICE DAILY - one dose in the morning and one dose in the evening with or without food.  It would be best to take the doses about the same time each day. The dose will change to 1 tablet twice a day on 11/13/17.   If you have difficulty swallowing the tablet whole please discuss with your pharmacist how to take the medication safely.  Take Eliquis exactly as prescribed by your doctor and DO NOT stop taking Eliquis without talking to the doctor who prescribed the medication.  Stopping may increase your risk of developing a new clot or stroke.  Refill your prescription before you run out.  After discharge, you should have regular check-up appointments with your healthcare provider that is prescribing your Eliquis.  In the future your dose may need to be changed if your kidney function or weight changes by a significant amount or as you get older.  What do you do if you miss a dose? If you miss a dose, take it as soon as you remember on the same day and resume taking twice daily.  Do not take more than one dose of ELIQUIS at the same time.  Important Safety Information A possible side effect of Eliquis is bleeding. You should call your healthcare provider right away if you experience any of the following: ? Bleeding from an injury or your nose that does not stop. ? Unusual colored urine  (red or dark brown) or unusual colored stools (red or black). ? Unusual bruising for unknown reasons. ? A serious fall or if you hit your head (even if there is no bleeding).  Some medicines may interact with Eliquis and might increase your risk of bleeding or clotting while on Eliquis. To help avoid this, consult your healthcare provider or pharmacist prior to using any new prescription or non-prescription medications, including herbals, vitamins, non-steroidal anti-inflammatory drugs (NSAIDs) and supplements.  This website has more information on Eliquis (apixaban): [email or web address].    Pulmonary Embolism A pulmonary embolism (PE) is a sudden blockage or decrease of blood flow in one lung or both lungs. Most blockages come from a blood clot that forms in a lower leg, thigh, or arm vein (deep vein thrombosis, DVT) and travels to the lungs. A clot is blood that has thickened into a gel or solid. PE is a dangerous and life-threatening condition that needs to be treated right away. What are the causes? This condition is usually caused by a blood clot that forms in a vein and moves to the lungs. In rare cases, it may be caused by air, fat, part of a tumor, or other tissue that moves through the veins and into the lungs. What increases the risk? The following factors may make you more likely to develop this condition:  Having DVT or a history of DVT.  Being older than age 11.  Personal or  family history of blood clots or blood clotting disease.  Major or lengthy surgery.  Orthopedic surgery, especially hip or knee replacement.  Traumatic injury, such as breaking a hip or leg.  Spinal cord injury.  Stroke.  Taking medicines that contain estrogen. These include birth control pills and hormone replacement therapy.  Long-term (chronic) lung or heart disease.  Cancer and chemotherapy.  Having a central venous catheter.  Pregnancy and the period after delivery.  What are the signs  or symptoms? Symptoms of this condition usually start suddenly and include:  Shortness of breath while active or at rest.  Coughing or coughing up blood or blood-tinged mucus.  Chest pain that is often worse with deep breaths.  Rapid or irregular heartbeat.  Feeling light-headed or dizzy.  Fainting.  Feeling anxious.  Sweating.  Pain and swelling in a leg. This is a symptom of DVT, which can lead to PE.  How is this diagnosed? This condition may be diagnosed based on:  Your medical history.  A physical exam.  Blood tests to check blood oxygen level and how well your blood clots, and a D-dimer blood test, which checks your blood for a substance that is released when a blood clot breaks apart.  CT pulmonary angiogram. This test checks blood flow in and around your lungs.  Ventilation-perfusion scan, also called a lung VQ scan. This test measures air flow and blood flow to the lungs.  Ultrasound of the legs to look for blood clots.  How is this treated? Treatment for this conditions depends on many factors, such as the cause of your PE, your risk for bleeding or developing more clots, and other medical conditions you have. Treatment aims to remove, dissolve, or stop blood clots from forming or growing larger. Treatment may include:  Blood thinning medicines (anticoagulants) to stop clots from forming or growing. These medicines may be given as a pill, as an injection, or through an IV tube (infusion).  Medicines that dissolve clots (thrombolytics).  A procedure in which a flexible tube is used to remove a blood clot (embolectomy) or deliver medicine to destroy it (catheter-directed thrombolysis).  A procedure in which a filter is inserted into a large vein that carries blood to the heart (inferior vena cava). This filter (vena cava filter) catches blood clots before they reach the lungs.  Surgery to remove the clot (surgical embolectomy). This is rare.  You may need a  combination of immediate, long-term (up to 3 months after diagnosis), and extended (more than 3 months after diagnosis) treatments. Your treatment may continue for several months (maintenance therapy). You and your health care provider will work together to choose the treatment program that is best for you. Follow these instructions at home: If you are taking an anticoagulant medicine:  Take the medicine every day at the same time each day.  Understand what foods and drugs interact with your medicine.  Understand the side effects of this medicine, including excessive bruising or bleeding. Ask your health care provider or pharmacist about other side effects. General instructions  Take over-the-counter and prescription medicines only as told by your health care provider.  Anticoagulant medicines may cause side effects, including easy bruising and difficulty stopping bleeding. If you are prescribed an anticoagulant: ? Hold pressure over cuts for longer than usual. ? Tell your dentist and other health care providers that you are taking anticoagulants before you have any procedure that may cause bleeding. ? Avoid contact sports. ? Be extra careful when  handling sharp objects. ? Use a soft toothbrush. Floss with waxed dental floss. ? Shave with an Copy.  Wear a medical alert bracelet or carry a medical alert card that says you have had a PE.  Ask your health care provider when you may return to your normal activities.  Talk with your health care provider about any travel plans. It is important to make sure that you are still able to take your medicine while on trips.  Keep all follow-up visits as told by your health care provider. This is important. How is this prevented? Take these actions to lower your risk of developing another PE:  Exercise regularly. Take frequent walks. For at least 30 minutes every day, engage in: ? Activity that involves moving your arms and  legs. ? Activity that encourages good blood flow through your body by increasing your heart rate.  While traveling, drink plenty of water and avoid drinking alcohol. Ask your health care provider if you should wear below-the-knee compression stockings.  Avoid sitting or lying in bed for long periods of time without moving your legs. Exercise your arms and legs every hour during long-distance travel (over 4 hours).  If you are hospitalized or have surgery, ask your health care provider about your risks and what treatments can help prevent blood clots.  Maintain a healthy weight. Ask your health care provider what weight is healthy for you.  If you are a woman who is over age 38, avoid unnecessary use of medicines that contain estrogen, including birth control pills.  Do not use any products that contain nicotine or tobacco, such as cigarettes and e-cigarettes. This is especially important if you take estrogen medicines. If you need help quitting, ask your health care provider.  See your health care provider for regular checkups. This may include blood tests and ultrasound testing on your legs to check for new blood clots.  Contact a health care provider if:  You missed a dose of your blood thinner medicine. Get help right away if:  You have new or increased pain, swelling, warmth, or redness in an arm or leg.  You have numbness or tingling in an arm or leg.  You have shortness of breath while active or at rest.  You have chest pain.  You have a rapid or irregular heartbeat.  You feel light-headed or dizzy.  You cough up blood.  You have blood in your vomit, stool, or urine.  You have a fever.  You have abdomen (abdominal) pain.  You have a severe fall or head injury.  You have a severe headache.  You have vision changes.  You cannot move your arms or legs.  You are confused or have memory loss.  You are bleeding for 10 minutes or more, even with strong pressure on  the wound. These symptoms may represent a serious problem that is an emergency. Do not wait to see if the symptoms will go away. Get medical help right away. Call your local emergency services (911 in the [place] Do not drive yourself to the hospital. Summary  A pulmonary embolism (PE) is a sudden blockage or decrease of blood flow in one lung or both lungs. PE is a dangerous and life-threatening condition that needs to be treated right away.  Having deep vein thrombosis (DVT) or a history of DVT is the most common risk factor for PE.  Treatments for this condition usually include medicines to thin your blood (anticoagulants) or medicines to break apart  blood clots (thrombolytics).  If you are prescribed blood thinners, it is important to take the medicine every single day at the same time each day.  If you have signs of PE or DVT, call your local emergency services (911 in the [place] This information is not intended to replace advice given to you by your health care provider. Make sure you discuss any questions you have with your health care provider. Document Released: 01/27/2000 Document Revised: 03/03/2016 Document Reviewed: 03/03/2016 Elsevier Interactive Patient Education  2018 [place] [facility]-healthy meal planning includes:  Limiting unhealthy fats.  Increasing healthy fats.  Making other small dietary changes.  You may need to talk with your doctor or a diet specialist (dietitian) to create an eating plan that is right for you. What types of fat should I choose?  Choose healthy fats. These include olive oil and canola oil, flaxseeds, walnuts, almonds, and seeds.  Eat more omega-3 fats. These include salmon, mackerel, sardines, tuna, flaxseed oil, and ground flaxseeds. Try to eat fish at least twice each week.  Limit saturated fats. ? Saturated fats are often found in animal products, such as meats, butter, and cream. ? Plant sources of  saturated fats include palm oil, palm kernel oil, and coconut oil.  Avoid foods with partially hydrogenated oils in them. These include stick margarine, some tub margarines, cookies, crackers, and other baked goods. These contain trans fats. What general guidelines do I need to follow?  Check food labels carefully. Identify foods with trans fats or high amounts of saturated fat.  Fill one half of your plate with vegetables and green salads. Eat 4-5 servings of vegetables per day. A serving of vegetables is: ? 1 cup of raw leafy vegetables. ?  cup of raw or cooked cut-up vegetables. ?  cup of vegetable juice.  Fill one fourth of your plate with whole grains. Look for the word "whole" as the first word in the ingredient list.  Fill one fourth of your plate with lean protein foods.  Eat 4-5 servings of fruit per day. A serving of fruit is: ? One medium whole fruit. ?  cup of dried fruit. ?  cup of fresh, frozen, or canned fruit. ?  cup of 100% fruit juice.  Eat more foods that contain soluble fiber. These include apples, broccoli, carrots, beans, peas, and barley. Try to get 20-30 g of fiber per day.  Eat more home-cooked food. Eat less restaurant, buffet, and fast food.  Limit or avoid alcohol.  Limit foods high in starch and sugar.  Avoid fried foods.  Avoid frying your food. Try baking, boiling, grilling, or broiling it instead. You can also reduce fat by: ? Removing the skin from poultry. ? Removing all visible fats from meats. ? Skimming the fat off of stews, soups, and gravies before serving them. ? Steaming vegetables in water or broth.  Lose weight if you are overweight.  Eat 4-5 servings of nuts, legumes, and seeds per week: ? One serving of dried beans or legumes equals  cup after being cooked. ? One serving of nuts equals 1 ounces. ? One serving of seeds equals  ounce or one tablespoon.  You may need to keep track of how much salt or sodium you eat. This  is especially true if you have high blood pressure. Talk with your doctor or dietitian to get more information. What foods can I eat? Grains Breads, including [place], white, pita, wheat, raisin, rye, oatmeal,  and [place]. Tortillas that are neither fried nor made with lard or trans fat. Low-fat rolls, including hotdog and hamburger buns and English muffins. Biscuits. Muffins. Waffles. Pancakes. Light popcorn. Whole-grain cereals. Flatbread. Melba toast. Pretzels. Breadsticks. Rusks. Low-fat snacks. Low-fat crackers, including oyster, saltine, matzo, graham, animal, and rye. Rice and pasta, including brown rice and pastas that are made with whole wheat. Vegetables All vegetables. Fruits All fruits, but limit coconut. Meats and Other Protein Sources Lean, well-trimmed beef, veal, pork, and lamb. Chicken and [place] without skin. All fish and shellfish. Wild duck, rabbit, pheasant, and venison. Egg whites or low-cholesterol egg substitutes. Dried beans, peas, lentils, and tofu. Seeds and most nuts. Dairy Low-fat or nonfat cheeses, including ricotta, string, and mozzarella. Skim or 1% milk that is liquid, powdered, or evaporated. Buttermilk that is made with low-fat milk. Nonfat or low-fat yogurt. Beverages Mineral water. Diet carbonated beverages. Sweets and Desserts Sherbets and fruit ices. Honey, jam, marmalade, jelly, and syrups. Meringues and gelatins. Pure sugar candy, such as hard candy, jelly beans, gumdrops, mints, marshmallows, and small amounts of dark chocolate. [employer] Eat all sweets and desserts in moderation. Fats and Oils Nonhydrogenated (trans-free) margarines. Vegetable oils, including soybean, sesame, sunflower, olive, peanut, safflower, corn, canola, and cottonseed. Salad dressings or mayonnaise made with a vegetable oil. Limit added fats and oils that you use for cooking, baking, salads, and as spreads. Other Cocoa powder. Coffee and tea. All seasonings and  condiments. The items listed above may not be a complete list of recommended foods or beverages. Contact your dietitian for more options. What foods are not recommended? Grains Breads that are made with saturated or trans fats, oils, or whole milk. Croissants. Butter rolls. Cheese breads. Sweet rolls. Donuts. Buttered popcorn. Chow mein noodles. High-fat crackers, such as cheese or butter crackers. Meats and Other Protein Sources Fatty meats, such as hotdogs, short ribs, sausage, spareribs, bacon, rib eye roast or steak, and mutton. High-fat deli meats, such as salami and bologna. Caviar. Domestic duck and goose. Organ meats, such as kidney, liver, sweetbreads, and heart. Dairy Cream, sour cream, cream cheese, and creamed cottage cheese. Whole-milk cheeses, including blue (bleu), [place] Jack, [place], [place], American, [place], Swiss, cheddar, [place], and [place]. Whole or 2% milk that is liquid, evaporated, or condensed. Whole buttermilk. Cream sauce or high-fat cheese sauce. Yogurt that is made from whole milk. Beverages Regular sodas and juice drinks with added sugar. Sweets and Desserts Frosting. Pudding. Cookies. Cakes other than angel food cake. Candy that has milk chocolate or white chocolate, hydrogenated fat, butter, coconut, or unknown ingredients. Buttered syrups. Full-fat ice cream or ice cream drinks. Fats and Oils Gravy that has suet, meat fat, or shortening. Cocoa butter, hydrogenated oils, palm oil, coconut oil, palm kernel oil. These can often be found in baked products, candy, fried foods, nondairy creamers, and whipped toppings. Solid fats and shortenings, including bacon fat, salt pork, lard, and butter. Nondairy cream substitutes, such as coffee creamers and sour cream substitutes. Salad dressings that are made of unknown oils, cheese, or sour cream. The items listed above may not be a complete list of foods and beverages to avoid. Contact your dietitian for more  information. This information is not intended to replace advice given to you by your health care provider. Make sure you discuss any questions you have with your health care provider. Document Released: 07/31/2011 Document Revised: 07/07/2015 Document Reviewed: 07/23/2013 Elsevier Interactive Patient Education  [employer].

## 2017-11-06 NOTE — Progress Notes (Signed)
    Coronary CTA showed low calcium score of 0.7 which is at 40th percentile for age and sex, only minimal non-obstructive CAD. However, it did reveal small pulmonary emboli in the RLL branches of the right pulmonary artery. Discussed with Dr. Percival Spanish. Will start DOAC with Eliquis. I spoke to the patient about her results. She says that she had some unsteadiness while on Eliquis in the past but was not significant enough for her not to take it now.   Alison Garrett, AGNP-C [facility] 11/06/2017  1:44 PM Pager: [phone]

## 2017-11-06 NOTE — Plan of Care (Signed)
  Problem: Education: Goal: Knowledge of General Education information will improve Description Including pain rating scale, medication(s)/side effects and non-pharmacologic comfort measures Outcome: Progressing Note:  POC reviewed with pt; aware she's NPO for CT scan this am.

## 2017-11-07 DIAGNOSIS — R079 Chest pain, unspecified: Secondary | ICD-10-CM | POA: Diagnosis not present

## 2017-11-07 DIAGNOSIS — I2699 Other pulmonary embolism without acute cor pulmonale: Secondary | ICD-10-CM | POA: Diagnosis not present

## 2017-11-07 MED ORDER — ELIQUIS 5 MG VTE STARTER PACK: ORAL_TABLET | ORAL | 0 refills | Status: DC

## 2017-11-07 MED ORDER — APIXABAN 5 MG PO TABS: 5.0000 mg | ORAL_TABLET | Freq: Two times a day (BID) | ORAL | 3 refills | Status: DC

## 2017-11-07 NOTE — Progress Notes (Signed)
Progress Note  Patient Name: Alison Garrett Date of Encounter: 11/07/2017  Primary Cardiologist:   No primary care provider on file.   Subjective   No chest pain.  No SOB.   Inpatient Medications    Scheduled Meds: . apixaban  10 mg Oral BID  . [START ON 11/13/2017] apixaban  5 mg Oral BID  . hydrochlorothiazide  12.5 mg Oral Daily   Continuous Infusions:  PRN Meds: acetaminophen, metoprolol tartrate, nitroGLYCERIN, ondansetron (ZOFRAN) IV   Vital Signs    Vitals:   11/06/17 0800 11/06/17 1450 11/06/17 2056 11/07/17 0623  BP:  109/64 (!) 104/58 116/63  Pulse:  65 61 64  Resp:      Temp:  97.9 F (36.6 C) 97.8 F (36.6 C) 98.1 F (36.7 C)  TempSrc:  Oral Oral Oral  SpO2: 96% (!) 87% 92% 92%  Weight:    99.8 kg  Height:        Intake/Output Summary (Last 24 hours) at 11/07/2017 0909 Last data filed at 11/06/2017 2100 Gross per 24 hour  Intake 960 ml  Output -  Net 960 ml   Filed Weights   11/05/17 1605 11/07/17 0623  Weight: 99.5 kg 99.8 kg    Telemetry    NSR - Personally Reviewed  ECG    NA - Personally Reviewed  Physical Exam   GEN: No  acute distress.   Neck: No  JVD Cardiac: RRR, no murmurs, rubs, or gallops.  Respiratory: Clear   to auscultation bilaterally. GI: Soft, nontender, non-distended, normal bowel sounds  MS:  No edema; No deformity. Neuro:   Nonfocal  Psych: Oriented and appropriate    Labs    Chemistry Recent Labs  Lab 11/05/17 0305 11/06/17 0234  NA 140 139  K 4.1 3.9  CL 104 104  CO2 27 26  GLUCOSE 137* 146*  BUN 17 14  CREATININE 1.17* 1.05*  CALCIUM 9.4 9.1  GFRNONAA 46* 53*  GFRAA 53* >60  ANIONGAP 9 9     Hematology Recent Labs  Lab 11/05/17 0305  WBC 7.0  RBC 5.20*  HGB 15.2*  HCT 45.7  MCV 87.9  MCH 29.2  MCHC 33.3  RDW 13.0  PLT 227    Cardiac Enzymes Recent Labs  Lab 11/05/17 0833 11/05/17 1543 11/05/17 2103 11/06/17 0234  TROPONINI <0.03 <0.03 <0.03 <0.03    Recent Labs  Lab  11/05/17 0311  TROPIPOC 0.00     BNP Recent Labs  Lab 11/05/17 0833  BNP 29.4     DDimer No results for input(s): DDIMER in the last 168 hours.   Radiology    Ct Coronary Morph W/cta Cor W/score W/ca W/cm &/or Wo/cm  Addendum Date: 11/06/2017   ADDENDUM REPORT: 11/06/2017 13:11 CLINICAL DATA:  72 year old female with PAF, hypertension who presented with chest pain and dyspnea on exertion. EXAM: Cardiac/Coronary  CT TECHNIQUE: The patient was scanned on a [employer]. FINDINGS: A 120 kV prospective scan was triggered in the descending thoracic aorta at 111 HU's. Axial non-contrast 3 mm slices were carried out through the heart. The data set was analyzed on a dedicated work station and scored using the [place]. Gantry rotation speed was 250 msecs and collimation was .6 mm. No beta blockade and 0.8 mg of sl NTG was given. The 3D data set was reconstructed in 5% intervals of the 67-82 % of the R-R cycle. Diastolic phases were analyzed on a dedicated work station using MPR, MIP and VRT  modes. The patient received 80 cc of contrast. Aorta:  Normal size.  No calcifications.  No dissection. Aortic Valve:  Trileaflet.  No calcifications. Coronary Arteries:  Normal coronary origin.  Right dominance. RCA is a large dominant artery that gives rise to PDA and PLVB. There is minimal plaque with stenosis 0-25%. Left main is a large artery that gives rise to LAD and LCX arteries. Left main has no plaque. LAD is a large vessel that gives rise to one large diagonal artery. There is no plaque. LCX is a non-dominant artery that gives rise to one small OM1 branch. There is no plaque. Other findings: Normal pulmonary vein drainage into the left atrium. Normal let atrial appendage without a thrombus. Normal size of the pulmonary artery. IMPRESSION: 1. Coronary calcium score of 0.7. This was 66 percentile for age and sex matched control. 2. Normal coronary origin with right dominance. 3. Minimal  non-obstructive CAD. 4. A small pulmonary emboli is seen in the right lower lobe branches of the right pulmonary artery. (Already described by Dr Ardeen Garland) Electronically Signed   By: Ena Dawley   On: 11/06/2017 13:11   Result Date: 11/06/2017 EXAM: OVER-READ INTERPRETATION  CT CHEST The following report is an over-read performed by radiologist Dr. Rolm Baptise of [facility], [place] on 11/06/2017. This over-read does not include interpretation of cardiac or coronary anatomy or pathology. The coronary CTA interpretation by the cardiologist is attached. COMPARISON:  None. FINDINGS: Vascular: Heart is normal size. Visualized aorta is normal caliber. Filling defects are noted in the right lower lobe pulmonary arteries compatible with pulmonary emboli. Mediastinum/Nodes: No adenopathy in the lower mediastinum or hila. Lungs/Pleura: Linear atelectasis or scarring dependently in the lung bases. No effusions. Upper Abdomen: No acute findings Musculoskeletal: Chest wall soft tissues are unremarkable. No acute bony abnormality IMPRESSION: Pulmonary emboli noted in right lower lobe pulmonary arterial branches. No evidence of right heart strain. These results were called by telephone at the time of interpretation on 11/06/2017 at 12:49 pm to Dr. Leanor Kail , who verbally acknowledged these results. Electronically Signed: By: Rolm Baptise M.D. On: 11/06/2017 12:49    Cardiac Studies   CT:    Aorta:  Normal size.  No calcifications.  No dissection. Aortic Valve:  Trileaflet.  No calcifications. Coronary Arteries:  Normal coronary origin.  Right dominance. RCA is a large dominant artery that gives rise to PDA and PLVB. There is minimal plaque with stenosis 0-25%. Left main is a large artery that gives rise to LAD and LCX arteries. Left main has no plaque. LAD is a large vessel that gives rise to one large diagonal artery. There is no plaque. LCX is a non-dominant artery that gives rise to one small  OM1 branch. There is no plaque.  Other findings: Normal pulmonary vein drainage into the left atrium. Normal let atrial appendage without a thrombus. Normal size of the pulmonary artery.  IMPRESSION: 1. Coronary calcium score of 0.7. This was 43 percentile for age and sex matched control. 2. Normal coronary origin with right dominance. 3. Minimal non obstructive CAD. 4. A small pulmonary emboli is seen in the right lower lobe branches of the right pulmonary artery. (Already described by Dr Ardeen Garland)   Patient Profile     72 y.o. female with paroxysmal atrial fibrillation (she has declined anticoagulation in past) and hypertension presented for evaluation of chest pain and shortness of breath.  Assessment & Plan    CHEST PAIN:  Enzymes negative.  CT  mild coronary plaque.    PULMONARY EMBOLISM:  Unprovoked.   On Eliquis.  Home today.  I think she will need lifelong Eliquis but she and I will discuss this over time.    ATRIAL FIB:   NSR.  No further treatment.      For questions or updates, please contact [place] Please consult [email or web address] for contact info under Cardiology/STEMI.   Signed, Minus Breeding, MD  11/07/2017, 9:09 AM

## 2017-11-07 NOTE — Discharge Summary (Addendum)
Discharge Summary    Patient ID: Alison Garrett MRN: [id number]; DOB: 1945-09-16  Admit date: 11/05/2017 Discharge date: 11/07/2017  Primary Care Provider: Chipper Herb, MD  Primary Cardiologist: Minus Breeding, MD  Primary Electrophysiologist:  None   Discharge Diagnoses    Principal Problem:   Chest pain with moderate risk for cardiac etiology Active Problems:   Obesity, unspecified   HYPERTENSION, BENIGN   Atrial fibrillation (HCC)   Hyperlipemia   Fibromyalgia   Allergies Allergies  Allergen Reactions  . Contrast Media [Iodinated Diagnostic Agents] Anaphylaxis  . Diltiazem Other (See Comments)    Pt. Had pain, jitters and felt terrible  . Penicillins Anaphylaxis    Has patient had a PCN reaction causing immediate rash, facial/tongue/throat swelling, SOB or lightheadedness with hypotension: yes Has patient had a PCN reaction causing severe rash involving mucus membranes or skin necrosis: Yesyes Has patient had a PCN reaction that required hospitalization nknown Has patient had a PCN reaction occurring within the last 10 years: no If all of the above answers are "NO", then may proceed with Cephalo  . Sulfonamide Derivatives Hives  . Benicar [Olmesartan] Rash  . Celebrex [Celecoxib] Anxiety  . Cymbalta [Duloxetine Hcl] Itching  . Latex Rash  . Neurontin [Gabapentin] Nausea Only  . Norvasc [Amlodipine Besylate] Swelling  . Prozac [Fluoxetine Hcl] Other (See Comments)  . Xanax [Alprazolam] Other (See Comments)  . Zoloft [Sertraline Hcl] Itching  . Zyprexa [Olanzapine] Other (See Comments)    Wt gain    Diagnostic Studies/Procedures    CT coronary 11/06/17: IMPRESSION: 1. Coronary calcium score of 0.7. This was 83 percentile for age and sex matched control.  2. Normal coronary origin with right dominance.  3. Minimal non-obstructive CAD.  4. A small pulmonary emboli is seen in the right lower lobe branches of the right pulmonary artery. (Already  described by Dr Ardeen Garland)   History of Present Illness     Alison Garrett is a 72 y.o. female with paroxysmal atrial fibrillation (she has declined anticoagulation in past) and hypertension presented for evaluation of chest pain and shortness of breath.  Remote history of intermittent palpitation due to atrial fibrillation.  She can tell when she goes in atrial fibrillation.  She has declined anticoagulation in past and she knows the risk of stroke  Patient had a sudden onset substernal chest pressure along with shortness of breath while helping her husband load a 50 pound of bag.  She had associated palpitations.  No radiation, nausea or vomiting.  Symptom lasted for about 5 to 10 minutes and eventually resolved.  However she never felt better.  She continues to have intermittent shortness of breath and chest pain leading to ER evaluation.  She was seen in ER last month for palpitations.  Felt dehydrated.  She does drink caffeine or soda and noted palpitation. She states it felt like A. fib.  No recurrent symptoms.  She denied orthopnea, PND, syncope, lower extremity edema, and melena.  No slurred speech or one-sided weakness.  She has noted recent exertional chest pain and shortness of breath (more) which relieves with rest.  She also has some exertional fatigue at work.  In ER, BNP normal, troponin negative, and electrolytes were normal.  Creatinine 1.17.  Hemoglobin stable.  Chest x-ray without acute cardiopulmonary disease.  EKG shows sinus rhythm with nonspecific T wave changes in inferior lead which is similar to prior EKG.  Hospital Course     Consultants: none  Chest pain, shortness  of breath Troponins remained negative and EKG without acute ischemic changes. Pt underwent CT coronary which showed minimal nonobstructive CAD.   Pulmonary embolism CT coronary noted small PE in the right lower lobe. She has declined anticoagulation in the past, but is now willing to take eliquis. Will need  lifelong anticoagulation as this PE is considered unprovoked.   Atrial fibrillation NSR this admission. No further medication changes.    Follow up with cardiology in 2-4 weeks.   Pt seen and examined by Dr. Percival Spanish and deemed stable for discharge. All follow up has been arranged.    _____________  Discharge Vitals Blood pressure 116/63, pulse 64, temperature 98.1 F (36.7 C), temperature source Oral, resp. rate 18, height 5\' 10"  (1.778 m), weight 99.8 kg, SpO2 92 %.  Filed Weights   11/05/17 1605 11/07/17 0623  Weight: 99.5 kg 99.8 kg    Labs & Radiologic Studies    CBC Recent Labs    11/05/17 0305  WBC 7.0  HGB 15.2*  HCT 45.7  MCV 87.9  PLT 269   Basic Metabolic Panel Recent Labs    11/05/17 0305 11/06/17 0234  NA 140 139  K 4.1 3.9  CL 104 104  CO2 27 26  GLUCOSE 137* 146*  BUN 17 14  CREATININE 1.17* 1.05*  CALCIUM 9.4 9.1   Liver Function Tests No results for input(s): AST, ALT, ALKPHOS, BILITOT, PROT, ALBUMIN in the last 72 hours. No results for input(s): LIPASE, AMYLASE in the last 72 hours. Cardiac Enzymes Recent Labs    11/05/17 1543 11/05/17 2103 11/06/17 0234  TROPONINI <0.03 <0.03 <0.03   BNP Invalid input(s): POCBNP D-Dimer No results for input(s): DDIMER in the last 72 hours. Hemoglobin A1C No results for input(s): HGBA1C in the last 72 hours. Fasting Lipid Panel No results for input(s): CHOL, HDL, LDLCALC, TRIG, CHOLHDL, LDLDIRECT in the last 72 hours. Thyroid Function Tests No results for input(s): TSH, T4TOTAL, T3FREE, THYROIDAB in the last 72 hours.  Invalid input(s): FREET3 _____________  Dg Chest 2 View  Result Date: 11/05/2017 CLINICAL DATA:  72 year old female with chest pain. EXAM: CHEST - 2 VIEW COMPARISON:  Chest radiograph dated 09/30/2017 FINDINGS: The lungs are clear. There is no pleural effusion or pneumothorax. The cardiac silhouette is within normal limits. Lower thoracic degenerative changes. IMPRESSION: No  active cardiopulmonary disease. Electronically Signed   By: Anner Crete M.D.   On: 11/05/2017 03:52   Ct Coronary Morph W/cta Cor W/score W/ca W/cm &/or Wo/cm  Addendum Date: 11/06/2017   ADDENDUM REPORT: 11/06/2017 13:11 CLINICAL DATA:  72 year old female with PAF, hypertension who presented with chest pain and dyspnea on exertion. EXAM: Cardiac/Coronary  CT TECHNIQUE: The patient was scanned on a [employer]. FINDINGS: A 120 kV prospective scan was triggered in the descending thoracic aorta at 111 HU's. Axial non-contrast 3 mm slices were carried out through the heart. The data set was analyzed on a dedicated work station and scored using the [place]. Gantry rotation speed was 250 msecs and collimation was .6 mm. No beta blockade and 0.8 mg of sl NTG was given. The 3D data set was reconstructed in 5% intervals of the 67-82 % of the R-R cycle. Diastolic phases were analyzed on a dedicated work station using MPR, MIP and VRT modes. The patient received 80 cc of contrast. Aorta:  Normal size.  No calcifications.  No dissection. Aortic Valve:  Trileaflet.  No calcifications. Coronary Arteries:  Normal coronary origin.  Right dominance. RCA  is a large dominant artery that gives rise to PDA and PLVB. There is minimal plaque with stenosis 0-25%. Left main is a large artery that gives rise to LAD and LCX arteries. Left main has no plaque. LAD is a large vessel that gives rise to one large diagonal artery. There is no plaque. LCX is a non-dominant artery that gives rise to one small OM1 branch. There is no plaque. Other findings: Normal pulmonary vein drainage into the left atrium. Normal let atrial appendage without a thrombus. Normal size of the pulmonary artery. IMPRESSION: 1. Coronary calcium score of 0.7. This was 70 percentile for age and sex matched control. 2. Normal coronary origin with right dominance. 3. Minimal non-obstructive CAD. 4. A small pulmonary emboli is seen in the right  lower lobe branches of the right pulmonary artery. (Already described by Dr Ardeen Garland) Electronically Signed   By: Ena Dawley   On: 11/06/2017 13:11   Result Date: 11/06/2017 EXAM: OVER-READ INTERPRETATION  CT CHEST The following report is an over-read performed by radiologist Dr. Rolm Baptise of [facility], [place] on 11/06/2017. This over-read does not include interpretation of cardiac or coronary anatomy or pathology. The coronary CTA interpretation by the cardiologist is attached. COMPARISON:  None. FINDINGS: Vascular: Heart is normal size. Visualized aorta is normal caliber. Filling defects are noted in the right lower lobe pulmonary arteries compatible with pulmonary emboli. Mediastinum/Nodes: No adenopathy in the lower mediastinum or hila. Lungs/Pleura: Linear atelectasis or scarring dependently in the lung bases. No effusions. Upper Abdomen: No acute findings Musculoskeletal: Chest wall soft tissues are unremarkable. No acute bony abnormality IMPRESSION: Pulmonary emboli noted in right lower lobe pulmonary arterial branches. No evidence of right heart strain. These results were called by telephone at the time of interpretation on 11/06/2017 at 12:49 pm to Dr. Leanor Kail , who verbally acknowledged these results. Electronically Signed: By: Rolm Baptise M.D. On: 11/06/2017 12:49   Disposition   Pt is being discharged home today in good condition.  Follow-up Plans & Appointments    Follow-up Information    Minus Breeding, MD Follow up in 2 week(s).   Specialty:  Cardiology Why:  Office will call with appt time. Follow up in 2-4 weeks. Contact information: [address] [place] [address] [phone]          Discharge Instructions    Diet - low sodium heart healthy   Complete by:  As directed    Increase activity slowly   Complete by:  As directed       Discharge Medications   Allergies as of 11/07/2017      Reactions   Contrast Media [iodinated  Diagnostic Agents] Anaphylaxis   Diltiazem Other (See Comments)   Pt. Had pain, jitters and felt terrible   Penicillins Anaphylaxis   Has patient had a PCN reaction causing immediate rash, facial/tongue/throat swelling, SOB or lightheadedness with hypotension: yes Has patient had a PCN reaction causing severe rash involving mucus membranes or skin necrosis: yes Has patient had a PCN reaction that required hospitalization unknown Has patient had a PCN reaction occurring within the last 10 years: No If all of the above answers are "NO", then may proceed with Cephalo   Sulfonamide Derivatives Hives   Benicar [olmesartan] Rash   Celebrex [celecoxib] Anxiety   Cymbalta [duloxetine Hcl] Itching   Latex Rash   Neurontin [gabapentin] Nausea Only   Norvasc [amlodipine Besylate] Swelling   Prozac [fluoxetine Hcl] Other (See Comments)  Xanax [alprazolam] Other (See Comments)   Zoloft [sertraline Hcl] Itching   Zyprexa [olanzapine] Other (See Comments)   Wt gain      Medication List    TAKE these medications   ELIQUIS STARTER PACK 5 MG Tabs Take as directed on package: start with two-5mg  tablets twice daily for 7 days. On day 8, switch to one-5mg  tablet twice daily.   apixaban 5 MG Tabs tablet Commonly known as:  ELIQUIS Take 1 tablet (5 mg total) by mouth 2 (two) times daily. Begin after starter pack is completed. Start taking on:  11/13/2017   hydrochlorothiazide 25 MG tablet Commonly known as:  HYDRODIURIL Take 0.5 tablets (12.5 mg total) by mouth daily.        Acute coronary syndrome (MI, NSTEMI, STEMI, etc) this admission?: No.    Outstanding Labs/Studies     Duration of Discharge Encounter   Greater than 30 minutes including physician time.  Signed, [place], PA 11/07/2017, 9:50 AM  Patient seen and examined.  Plan as discussed in my rounding note for today and outlined above. Jeneen Rinks [facility]  11/07/2017  10:06 AM

## 2017-11-10 ENCOUNTER — Emergency Department (HOSPITAL_COMMUNITY): Payer: Medicare HMO

## 2017-11-10 ENCOUNTER — Other Ambulatory Visit: Payer: Self-pay

## 2017-11-10 ENCOUNTER — Emergency Department (HOSPITAL_COMMUNITY)
Admission: EM | Admit: 2017-11-10 | Discharge: 2017-11-10 | Disposition: A | Discharge status: Home / Self Care | Payer: Medicare HMO | Attending: Emergency Medicine | Admitting: Emergency Medicine

## 2017-11-10 DIAGNOSIS — I129 Hypertensive chronic kidney disease with stage 1 through stage 4 chronic kidney disease, or unspecified chronic kidney disease: Secondary | ICD-10-CM | POA: Diagnosis not present

## 2017-11-10 DIAGNOSIS — R1013 Epigastric pain: Secondary | ICD-10-CM | POA: Diagnosis not present

## 2017-11-10 DIAGNOSIS — Z91040 Latex allergy status: Secondary | ICD-10-CM | POA: Insufficient documentation

## 2017-11-10 DIAGNOSIS — R140 Abdominal distension (gaseous): Secondary | ICD-10-CM | POA: Diagnosis not present

## 2017-11-10 DIAGNOSIS — N183 Chronic kidney disease, stage 3 (moderate): Secondary | ICD-10-CM | POA: Insufficient documentation

## 2017-11-10 DIAGNOSIS — Z79899 Other long term (current) drug therapy: Secondary | ICD-10-CM | POA: Diagnosis not present

## 2017-11-10 DIAGNOSIS — Z7901 Long term (current) use of anticoagulants: Secondary | ICD-10-CM | POA: Insufficient documentation

## 2017-11-10 DIAGNOSIS — R9431 Abnormal electrocardiogram [ECG] [EKG]: Secondary | ICD-10-CM | POA: Diagnosis not present

## 2017-11-10 LAB — COMPREHENSIVE METABOLIC PANEL
ALBUMIN: 3.4 g/dL — AB (ref 3.5–5.0)
ALK PHOS: 81 U/L (ref 38–126)
ALT: 21 U/L (ref 0–44)
AST: 29 U/L (ref 15–41)
Anion gap: 7 (ref 5–15)
BUN: 16 mg/dL (ref 8–23)
CALCIUM: 8.9 mg/dL (ref 8.9–10.3)
CO2: 28 mmol/L (ref 22–32)
Chloride: 104 mmol/L (ref 98–111)
Creatinine, Ser: 1.16 mg/dL — ABNORMAL HIGH (ref 0.44–1.00)
GFR calc Af Amer: 54 mL/min — ABNORMAL LOW (ref >60)
GFR calc non Af Amer: 47 mL/min — ABNORMAL LOW (ref >60)
GLUCOSE: 129 mg/dL — AB (ref 70–99)
Potassium: 4.1 mmol/L (ref 3.5–5.1)
SODIUM: 139 mmol/L (ref 135–145)
Total Bilirubin: 1.3 mg/dL — ABNORMAL HIGH (ref 0.3–1.2)
Total Protein: 6.2 g/dL — ABNORMAL LOW (ref 6.5–8.1)

## 2017-11-10 LAB — CBC
HEMATOCRIT: 43.9 % (ref 36.0–46.0)
Hemoglobin: 14.6 g/dL (ref 12.0–15.0)
MCH: 29.4 pg (ref 26.0–34.0)
MCHC: 33.3 g/dL (ref 30.0–36.0)
MCV: 88.3 fL (ref 78.0–100.0)
Platelets: 242 10*3/uL (ref 150–400)
RBC: 4.97 MIL/uL (ref 3.87–5.11)
RDW: 13 % (ref 11.5–15.5)
WBC: 6.9 10*3/uL (ref 4.0–10.5)

## 2017-11-10 LAB — LIPASE, BLOOD: LIPASE: 32 U/L (ref 11–51)

## 2017-11-10 LAB — I-STAT TROPONIN, ED: Troponin i, poc: 0.01 ng/mL (ref 0.00–0.08)

## 2017-11-10 LAB — PROTIME-INR
INR: 1.36
Prothrombin Time: 16.6 seconds — ABNORMAL HIGH (ref 11.4–15.2)

## 2017-11-10 MED ORDER — GI COCKTAIL ~~LOC~~
30.0000 mL | Freq: Once | ORAL | Status: AC
  Administered 2017-11-10: 30 mL via ORAL
  Filled 2017-11-10: qty 30

## 2017-11-10 NOTE — Discharge Instructions (Addendum)
Your x-ray showed moderate constipation.  We recommend that you try to increase her daily fiber.  Follow-up with your primary care doctor regarding your visit today.  Should you experience worsening pain in your abdomen, onset of nausea or vomiting, development of fever, or other concerning symptoms, return to the ED for repeat evaluation.

## 2017-11-10 NOTE — ED Provider Notes (Signed)
[facility] EMERGENCY DEPARTMENT Provider Note   CSN: [id number] Arrival date & time: 11/10/17  0410     History   Chief Complaint Chief Complaint  Patient presents with  . Abdominal Pain    HPI Alison Garrett is a 72 y.o. female.  72 year old female with a history of atrial fibrillation, esophageal reflux, hypertension, fibromyalgia, irritable bowel syndrome presents to the emergency department for complaints of upper abdominal discomfort.  Reports of pain in her epigastrium associated with intermittent abdominal bloating sensation.  She denies increased belching.  She has not had any nausea or vomiting.  Denies diarrhea.  Had a normal bowel movement today.  No history of melena or hematochezia, fevers.  Denies questionable food ingestion.  Was recently started on Eliquis after diagnosis of PE and medical admission.  Has been doing well otherwise.  Abdominal surgical history significant for hysterectomy.     Past Medical History:  Diagnosis Date  . Afib ([place])   . Complication of anesthesia    propathal   . Dyslipidemia   . Essential tremor 01/12/2014  . Fibromyalgia    Not currently under active treatment  . GERD (gastroesophageal reflux disease)   . Hiatal hernia   . Hypertension   . Obesity     Patient Active Problem List   Diagnosis Date Noted  . Chest pain with moderate risk for cardiac etiology 11/05/2017  . SOB (shortness of breath) 05/22/2017  . Chest pain 07/01/2016  . Paresthesias 06/07/2015  . Chronic kidney disease, stage 3 ([place]) 11/17/2014  . Atrial fibrillation with RVR ([place]) 11/16/2014  . Multiple drug allergies 08/11/2014  . Fatigue 01/12/2014  . Essential tremor 01/12/2014  . Metabolic syndrome 84/13/2440  . Hyperlipemia 09/17/2012  . Vitamin D deficiency 09/17/2012  . Fibromyalgia 09/17/2012  . Atrial fibrillation ([place]) 07/11/2011  . Obesity, unspecified 12/01/2008  . HYPERTENSION, BENIGN 12/01/2008  . ABNORMAL  ELECTROCARDIOGRAM 12/01/2008    Past Surgical History:  Procedure Laterality Date  . ABDOMINAL HYSTERECTOMY       OB History   None      Home Medications    Prior to Admission medications   Medication Sig Start Date End Date Taking? Authorizing Provider  ELIQUIS STARTER PACK (ELIQUIS STARTER PACK) 5 MG TABS Take as directed on package: start with two-5mg  tablets twice daily for 7 days. On day 8, switch to one-5mg  tablet twice daily. 11/07/17  Yes Duke, Tami Lin, PA  hydrochlorothiazide (HYDRODIURIL) 25 MG tablet Take 0.5 tablets (12.5 mg total) by mouth daily. 01/14/17  Yes Chipper Herb, MD  apixaban (ELIQUIS) 5 MG TABS tablet Take 1 tablet (5 mg total) by mouth 2 (two) times daily. Begin after starter pack is completed. Patient not taking: Reported on 11/10/2017 11/13/17   Ledora Bottcher, PA    Family History Family History  Problem Relation Age of Onset  . Coronary artery disease Mother   . Heart disease Mother        CABG  . Cancer Mother   . COPD Father   . Hepatitis Sister        autoimune  . Diabetes Sister     Social History Social History   Tobacco Use  . Smoking status: Never Smoker  . Smokeless tobacco: Never Used  Substance Use Topics  . Alcohol use: No    Alcohol/week: 0.0 standard drinks  . Drug use: No     Allergies   Contrast media [iodinated diagnostic agents]; Diltiazem; Penicillins; Sulfonamide derivatives;  Benicar [olmesartan]; Celebrex [celecoxib]; Cymbalta [duloxetine hcl]; Latex; Neurontin [gabapentin]; Norvasc [amlodipine besylate]; Prozac [fluoxetine hcl]; Xanax [alprazolam]; Zoloft [sertraline hcl]; and Zyprexa [olanzapine]   Review of Systems Review of Systems Ten systems reviewed and are negative for acute change, except as noted in the HPI.    Physical Exam Updated Vital Signs BP 123/69   Pulse 67   Temp 97.8 F (36.6 C) (Oral)   Resp 10   Ht 5\' 10"  (1.778 m)   Wt 99.8 kg   SpO2 95%   BMI 31.57 kg/m    Physical Exam  Constitutional: She is oriented to person, place, and time. She appears well-developed and well-nourished. No distress.  Nontoxic appearing and in NAD  HENT:  Head: Normocephalic and atraumatic.  Eyes: Conjunctivae and EOM are normal. No scleral icterus.  Neck: Normal range of motion.  Cardiovascular: Normal rate, regular rhythm and intact distal pulses.  Pulmonary/Chest: Effort normal. No respiratory distress.  Respirations even and unlabored  Abdominal:  Mild epigastric TTP. No abdominal distension or guarding. Abdomen soft without peritoneal signs.  Musculoskeletal: Normal range of motion.  Neurological: She is alert and oriented to person, place, and time. She exhibits normal muscle tone. Coordination normal.  Skin: Skin is warm and dry. No rash noted. She is not diaphoretic. No erythema. No pallor.  Psychiatric: She has a normal mood and affect. Her behavior is normal.  Nursing note and vitals reviewed.    ED Treatments / Results  Labs (all labs ordered are listed, but only abnormal results are displayed) Labs Reviewed  COMPREHENSIVE METABOLIC PANEL - Abnormal; Notable for the following components:      Result Value   Glucose, Bld 129 (*)    Creatinine, Ser 1.16 (*)    Total Protein 6.2 (*)    Albumin 3.4 (*)    Total Bilirubin 1.3 (*)    GFR calc non Af Amer 47 (*)    GFR calc Af Amer 54 (*)    All other components within normal limits  PROTIME-INR - Abnormal; Notable for the following components:   Prothrombin Time 16.6 (*)    All other components within normal limits  CBC  LIPASE, BLOOD  I-STAT TROPONIN, ED    EKG EKG Interpretation  Date/Time:  Sunday November 10 2017 04:34:36 EDT Ventricular Rate:  81 PR Interval:  196 QRS Duration: 84 QT Interval:  378 QTC Calculation: 439 R Axis:   43 Text Interpretation:  Normal sinus rhythm Nonspecific ST and T wave abnormality Abnormal ECG Confirmed by Thayer Jew [id number]) on 11/10/2017 5:51:55  AM   Radiology Dg Abd 2 Views  Result Date: 11/10/2017 CLINICAL DATA:  72 year old female with abdominal bloating. EXAM: ABDOMEN - 2 VIEW COMPARISON:  None. FINDINGS: There is moderate stool throughout the colon. No bowel dilatation or evidence of obstruction. No free air or radiopaque calculi. The osseous structures and soft tissues are grossly unremarkable. IMPRESSION: Moderate colonic stool burden.  No evidence of bowel obstruction. Electronically Signed   By: Anner Crete M.D.   On: 11/10/2017 06:48    Procedures Procedures (including critical care time)  Medications Ordered in ED Medications  gi cocktail (Maalox,Lidocaine,Donnatal) (30 mLs Oral Given 11/10/17 0700)    7:05 AM On repeat assessment, abdominal reexam is stable.  Patient expresses comfort with discharge.  Declines additional evaluation with CT.  Have discussed return precautions.  Patient verbalizes understanding.   Initial Impression / Assessment and Plan / ED Course  I have reviewed the triage vital signs  and the nursing notes.  Pertinent labs & imaging results that were available during my care of the patient were reviewed by me and considered in my medical decision making (see chart for details).     72 year old female presents for epigastric discomfort with intermittent bloating.  She has had no nausea or vomiting.  Notes normal bowel movements free of blood.  The patient is afebrile in the emergency department.  Her vital signs are reassuring.  Troponin ordered to evaluate for atypical cardiac etiology.  This is negative.  EKG also with out evidence of acute ischemic change.  She has no evidence of leukocytosis or electrolyte derangements.  Liver and kidney function are largely preserved.  Lipase is normal.  An x-ray was performed to evaluate for free air, obstruction.  It is notable for moderate colonic stool burden.  Question whether symptoms may be secondary to indigestion in the setting of known esophageal  reflux.  She was given a GI cocktail prior to discharge.  Repeat abdominal exam has been stable.  I did discuss further evaluation with CT scan.  Patient declines additional imaging at this time, stating that she is comfortable with outpatient follow-up with her primary doctor.  She has been instructed to return for new or concerning symptoms.  Return precautions discussed and provided. Patient discharged in stable condition with no unaddressed concerns.   Final Clinical Impressions(s) / ED Diagnoses   Final diagnoses:  Abdominal bloating  Epigastric pain    ED Discharge Orders    None       Antonietta Breach, PA-C 11/11/17 0540    Merryl Hacker, MD 11/12/17 [phone]

## 2017-11-10 NOTE — ED Notes (Signed)
Pt discharged from ED; instructions provided; Pt encouraged to return to ED if symptoms worsen and to f/u with PCP; Pt verbalized understanding of all instructions 

## 2017-11-10 NOTE — ED Triage Notes (Signed)
C/o abdominal pain that started all day. States was d/c'd here on Thursday with a dx of PE. Was started on Eloquis.

## 2017-11-18 ENCOUNTER — Encounter: Payer: Self-pay | Admitting: *Deleted

## 2017-11-18 ENCOUNTER — Ambulatory Visit (INDEPENDENT_AMBULATORY_CARE_PROVIDER_SITE_OTHER): Payer: Medicare HMO | Admitting: *Deleted

## 2017-11-18 VITALS — BP 132/87 | HR 76 | Ht 67.5 in | Wt 218.0 lb

## 2017-11-18 DIAGNOSIS — Z0000 Encounter for general adult medical examination without abnormal findings: Secondary | ICD-10-CM | POA: Diagnosis not present

## 2017-11-18 NOTE — Patient Instructions (Signed)
  Alison Garrett , Thank you for taking time to come for your Medicare Wellness Visit. I appreciate your ongoing commitment to your health goals. Please review the following plan we discussed and let me know if I can assist you in the future.   These are the goals we discussed: Goals    . Exercise 150 min/wk Moderate Activity       This is a list of the screening recommended for you and due dates:  Health Maintenance  Topic Date Due  . Pneumonia vaccines (1 of 2 - PCV13) 11/18/2011  . Stool Blood Test  07/16/2017  . Colon Cancer Screening  06/13/2018*  . Tetanus Vaccine  06/13/2018*  . Flu Shot  01/18/2020*  .  Hepatitis C: One time screening is recommended by Center for Disease Control  (CDC) for  adults born from [birth year] through 1964.   03/06/2024*  . Mammogram  06/13/2019  . DEXA scan (bone density measurement)  Completed  *Topic was postponed. The date shown is not the original due date.

## 2017-11-18 NOTE — Progress Notes (Addendum)
Subjective:   Alison Garrett is a 72 y.o. female who presents for a Medicare Annual Wellness Visit. Alison Garrett lives at home alone. She is employed at the [employer] and typically works 5 days a week. 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 She is also a member of the [employer]. She has 3 children. Two live locally and one lives in [place]. She also has 5 grandchildren. 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    Review of Systems    Patient reports that her overall health is unchanged compared to last year.  Cardiac Risk Factors include: advanced age (>30men, >47 women);hypertension   All other systems negative       Current Medications (verified) Outpatient Encounter Medications as of 11/18/2017  Medication Sig  . apixaban (ELIQUIS) 5 MG TABS tablet Take 1 tablet (5 mg total) by mouth 2 (two) times daily. Begin after starter pack is completed.  . hydrochlorothiazide (HYDRODIURIL) 25 MG tablet Take 0.5 tablets (12.5 mg total) by mouth daily.  . [DISCONTINUED] ELIQUIS STARTER PACK (ELIQUIS STARTER PACK) 5 MG TABS Take as directed on package: start with two-5mg  tablets twice daily for 7 days. On day 8, switch to one-5mg  tablet twice daily.   No facility-administered encounter medications on file as of 11/18/2017.     Allergies (verified) Contrast media [iodinated diagnostic agents]; Diltiazem; Penicillins; Sulfonamide derivatives; Benicar [olmesartan]; Celebrex [celecoxib]; Cymbalta [duloxetine hcl]; Latex; Neurontin [gabapentin]; Norvasc [amlodipine besylate]; Prozac [fluoxetine hcl]; Xanax [alprazolam]; Zoloft [sertraline hcl]; and Zyprexa [olanzapine]   History: Past Medical History:  Diagnosis Date  . Afib ([place])   . Complication of anesthesia    propathal   . Dyslipidemia   . Essential tremor 01/12/2014  . Fibromyalgia    Not currently under active  treatment  . GERD (gastroesophageal reflux disease)   . Hiatal hernia   . Hypertension   . Obesity    Past Surgical History:  Procedure Laterality Date  . ABDOMINAL HYSTERECTOMY     Family History  Problem Relation Age of Onset  . Coronary artery disease Mother   . Heart disease Mother        CABG  . Cancer Mother        trachea  . COPD Father   . Hepatitis Sister        autoimune  . Diabetes Sister    Social History   Socioeconomic History  . Marital status: Divorced    Spouse name: Not on file  . Number of children: 3  . Years of education: some college  . Highest education level: Some college, no degree  Occupational History  . Occupation: Unemployed and disabled  Social Needs  . Financial resource strain: Not hard at all  . Food insecurity:    Worry: Never true    Inability: Never true  . Transportation needs:    Medical: No    Non-medical: No  Tobacco Use  . Smoking status: Never Smoker  . Smokeless tobacco: Never Used  Substance and Sexual Activity  . Alcohol use: No    Alcohol/week: 0.0 standard drinks  . Drug use: No  . Sexual activity: Not Currently  Lifestyle  . Physical activity:    Days per week: 0 days    Minutes per session: 0 min  . Stress: Only a little  Relationships  . Social connections:  Talks on phone: More than three times a week    Gets together: More than three times a week    Attends religious service: Never    Active member of club or organization: Yes    Attends meetings of clubs or organizations: More than 4 times per year    Relationship status: Divorced  Other Topics Concern  . Not on file  Social History Narrative   Lives in [place].   Patient is right handed   Drinks very little caffeine.    Tobacco Use No.  Clinical Intake:  Pre-visit preparation completed: No  Pain : No/denies pain Pain Score: 0-No pain     Nutritional Status: BMI > 30  Obese Diabetes: No  How often do you need to have someone  help you when you read instructions, pamphlets, or other written materials from your doctor or pharmacy?: 1 - Never What is the last grade level you completed in school?: [employer] after high school  Interpreter Needed?: No  Information entered by :: Chong Sicilian, RN   Activities of Daily Living In your present state of health, do you have any difficulty performing the following activities: 11/18/2017 11/05/2017  Hearing? N N  Vision? N N  Difficulty concentrating or making decisions? N N  Walking or climbing stairs? N Y  Dressing or bathing? N N  Doing errands, shopping? N N  Preparing Food and eating ? N -  Using the Toilet? N -  In the past six months, have you accidently leaked urine? N -  Do you have problems with loss of bowel control? N -  Managing your Medications? N -  Managing your Finances? N -  Housekeeping or managing your Housekeeping? N -  Some recent data might be hidden     Diet Seeing a nutritionist 3 meals a day or doesn't eat supper if dinner is late Drinks mostly water  Cooks most meals and eats out on occasion. Has a light lunch.   Exercise Current Exercise Habits: The patient does not participate in regular exercise at present(walks dogs daily at work), Type of exercise: walking, Time (Minutes): 60, Frequency (Times/Week): 5, Weekly Exercise (Minutes/Week): 300, Intensity: Mild, Exercise limited by: None identified   Depression Screen PHQ 2/9 Scores 11/18/2017 06/12/2017 12/04/2016 07/11/2016 05/23/2016 01/18/2016 08/12/2015  PHQ - 2 Score 0 0 0 0 0 0 0  PHQ- 9 Score - - - - - - -     Fall Risk Fall Risk  11/18/2017 06/12/2017 12/04/2016 07/11/2016 05/23/2016  Falls in the past year? No No No No No    Safety Is the patient's home free of loose throw rugs in walkways, pet beds, electrical cords, etc?   yes        Handrails on the stairs?   yes      Adequate lighting?   yes  Patient Care Team: Chipper Herb, MD as PCP - General (Family  Medicine) Minus Breeding, MD as PCP - Cardiology (Cardiology)  Hospitalizations, surgeries, and ER visits in previous 12 months Several ED visits over the past year due to chest pain or dyspnea. One admission for CP in September 2019 that turned out to be a PE.   Objective:    Today's Vitals   11/18/17 1522 11/18/17 1523  BP: 132/87   Pulse: 76   Weight: 218 lb (98.9 kg)   Height: 5' 7.5" (1.715 m)   PainSc:  0-No pain   Body mass index is 33.64 kg/m.  Advanced Directives 11/18/2017 11/05/2017 11/05/2017 07/01/2016 07/01/2016 10/05/2015 08/16/2015  Does Patient Have a Medical Advance Directive? Yes No No No No No No  Type of Advance Directive Living will;Healthcare Power of Attorney - - - - - -  Does patient want to make changes to medical advance directive? No - Patient declined - - - - - -  Copy of [place] in Chart? No - copy requested - - - - - -  Would patient like information on creating a medical advance directive? - No - Patient declined - No - Patient declined No - Patient declined No - patient declined information No - patient declined information    Hearing/Vision  No hearing or vision deficits noted during visit.  Cognitive Function: MMSE - Mini Mental State Exam 11/18/2017  Orientation to time 5  Orientation to Place 5  Registration 3  Attention/ Calculation 5  Recall 3  Language- name 2 objects 2  Language- repeat 1  Language- follow 3 step command 3  Language- read & follow direction 1  Write a sentence 1  Copy design 1  Total score 30       Normal Cognitive Function Screening: Yes    Immunizations and Health Maintenance  There is no immunization history on file for this patient. Health Maintenance Due  Topic Date Due  . PNA vac Low Risk Adult (1 of 2 - PCV13) 11/18/2011  . COLON CANCER SCREENING ANNUAL FOBT  07/16/2017   Health Maintenance  Topic Date Due  . PNA vac Low Risk Adult (1 of 2 - PCV13) 11/18/2011  . COLON CANCER  SCREENING ANNUAL FOBT  07/16/2017  . COLONOSCOPY  06/13/2018 (Originally 03/25/2013)  . TETANUS/TDAP  06/13/2018 (Originally 12/14/2014)  . INFLUENZA VACCINE  01/18/2020 (Originally 09/12/2017)  . Hepatitis C Screening  03/06/2024 (Originally [birth year])  . MAMMOGRAM  06/13/2019  . DEXA SCAN  Completed        Assessment:   This is a routine wellness examination for Tessi.    Plan:    Goals    . Exercise 150 min/wk Moderate Activity        Health Maintenance Recommendations: Pneumococcal vaccine  Influenza vaccine Td vaccine Colorectal cancer screening *Declines vaccines and states that she returned a FOBT to her insurance company recently. They will forward results to our office.   Additional Screening Recommendations: Lung: Low Dose CT Chest recommended if Age 40-80 years, 30 pack-year currently smoking OR have quit w/in 15years. Patient does not qualify. Hepatitis C Screening recommended: yes  Keep f/u with Chipper Herb, MD and any other specialty appointments you may have Continue current medications Move carefully to avoid falls. Use assistive devices like a cane or walker if needed. Aim for at least 150 minutes of moderate activity a week. This can be done with chair exercises if necessary. Read or work on puzzles daily Stay connected with friends and family  I have personally reviewed and noted the following in the patient's chart:   . Medical and social history . Use of alcohol, tobacco or illicit drugs  . Current medications and supplements . Functional ability and status . Nutritional status . Physical activity . Advanced directives . List of other physicians . Hospitalizations, surgeries, and ER visits in previous 12 months . Vitals . Screenings to include cognitive, depression, and falls . Referrals and appointments  In addition, I have reviewed and discussed with patient certain preventive protocols, quality metrics, and best practice recommendations. A  written  personalized care plan for preventive services as well as general preventive health recommendations were provided to patient.     Chong Sicilian, RN   11/18/2017   I have reviewed and agree with the above AWV documentation.   Arrie Senate MD

## 2017-11-26 NOTE — Progress Notes (Signed)
HPI The patient presents for evaluation of atrial fibrillation.  She was in the hospital recently for chest pain and was found to have a small PE.  She was sent home on anticoagulation.  Since I last saw her she was in the ED with abdominal pain.  I reviewed these records for this visit.   She was thought to have possible indigestion and there were no other acute findings.    She is had continued abdominal discomfort across the mid right and left upper abdomen.  This seems to hurt all the time.  She is also had episodes where her heart is racing.  This does not feel like her previous A. fib.  She does not think it is irregular.  She noticed it yesterday after she was trying to do a little work unscrewing some screws.  Might last for several minutes.  She does not have presyncope or syncope.  She gets anxious and wonders whether it could have been new pulmonary emboli.  She is not having any substernal pain, neck or arm pain.  She has some chronic dyspnea but is not having any PND or orthopnea.   Allergies  Allergen Reactions  . Contrast Media [Iodinated Diagnostic Agents] Anaphylaxis  . Penicillins Anaphylaxis  . Sulfonamide Derivatives Hives  . Benicar [Olmesartan] Rash  . Celebrex [Celecoxib] Anxiety  . Cymbalta [Duloxetine Hcl] Itching  . Latex Rash  . Neurontin [Gabapentin] Nausea Only  . Norvasc [Amlodipine Besylate] Swelling  . Prozac [Fluoxetine Hcl] Other (See Comments)  . Xanax [Alprazolam] Other (See Comments)  . Zoloft [Sertraline Hcl] Itching  . Zyprexa [Olanzapine] Other (See Comments)    Wt gain    Current Outpatient Medications  Medication Sig Dispense Refill  . apixaban (ELIQUIS) 5 MG TABS tablet Take 1 tablet (5 mg total) by mouth 2 (two) times daily. Begin after starter pack is completed. 180 tablet 3  . hydrochlorothiazide (HYDRODIURIL) 25 MG tablet Take 0.5 tablets (12.5 mg total) by mouth daily. 45 tablet 3   No current facility-administered medications for  this visit.     Past Medical History:  Diagnosis Date  . Afib ([place])   . Complication of anesthesia    propathal   . Dyslipidemia   . Essential tremor 01/12/2014  . Fibromyalgia    Not currently under active treatment  . GERD (gastroesophageal reflux disease)   . Hiatal hernia   . Hypertension   . Obesity     Past Surgical History:  Procedure Laterality Date  . ABDOMINAL HYSTERECTOMY       ROS: As stated in the HPI and negative for all other systems.   PHYSICAL EXAM BP (!) 138/96   Pulse 88   Ht 5\' 10"  (1.778 m)   Wt 219 lb (99.3 kg)   BMI 31.42 kg/m   GENERAL:  Well appearing NECK:  No jugular venous distention, waveform within normal limits, carotid upstroke brisk and symmetric, no bruits, no thyromegaly LUNGS:  Clear to auscultation bilaterally CHEST:  Unremarkable HEART:  PMI not displaced or sustained,S1 and S2 within normal limits, no S3, no S4, no clicks, no rubs, no murmurs ABD:  Flat, positive bowel sounds normal in frequency in pitch, no bruits, no rebound, no guarding, no midline pulsatile mass, no hepatomegaly, no splenomegaly EXT:  2 plus pulses throughout, trace leg edema, no cyanosis no clubbing   Lab Results  Component Value Date   TSH 1.56 10/07/2015   EKG:   NA   ASSESSMENT  AND PLAN   ATRIAL FIB:    [name] has a CHA2DS2 - VASc score of 3 with a risk of stroke of 3.2%.    She will continue meds as listed.  PALPITATIONS: She does not think this is atrial fibrillation but it could be another dysrhythmia. I will apply a Zio Patch  HTN:  Her blood pressure is at target.  No change in therapy.   PULMONARY EMBOLISM:   She will continue anticoagulation indefinitely as this was an unprovoked pulmonary embolism.  ABDOMINAL PAIN:  She is due to see Chipper Herb, MD.  I do not see a cardiovascular etiology.

## 2017-11-27 ENCOUNTER — Ambulatory Visit (INDEPENDENT_AMBULATORY_CARE_PROVIDER_SITE_OTHER): Payer: Medicare HMO | Admitting: Cardiology

## 2017-11-27 ENCOUNTER — Encounter: Payer: Self-pay | Admitting: Cardiology

## 2017-11-27 VITALS — BP 138/96 | HR 88 | Ht 70.0 in | Wt 219.0 lb

## 2017-11-27 DIAGNOSIS — I480 Paroxysmal atrial fibrillation: Secondary | ICD-10-CM | POA: Diagnosis not present

## 2017-11-27 DIAGNOSIS — R002 Palpitations: Secondary | ICD-10-CM | POA: Diagnosis not present

## 2017-11-27 DIAGNOSIS — I10 Essential (primary) hypertension: Secondary | ICD-10-CM

## 2017-11-27 DIAGNOSIS — I2699 Other pulmonary embolism without acute cor pulmonale: Secondary | ICD-10-CM

## 2017-11-27 NOTE — Patient Instructions (Signed)
Medication Instructions:  The current medical regimen is effective;  continue present plan and medications.  If you need a refill on your cardiac medications before your next appointment, please call your pharmacy.   Testing/Procedures: Your physician has recommended that you wear an event monitor. Event monitors are medical devices that record the heart's electrical activity. Doctors most often [place] these monitors to diagnose arrhythmias. Arrhythmias are problems with the speed or rhythm of the heartbeat. The monitor is a small, portable device. You can wear one while you do your normal daily activities. This is usually used to diagnose what is causing palpitations/syncope (passing out).   Follow-Up: Follow up in 3 months with Dr Percival Spanish in [place].  Thank you for choosing [facility]!!

## 2017-11-29 DIAGNOSIS — H2513 Age-related nuclear cataract, bilateral: Secondary | ICD-10-CM | POA: Diagnosis not present

## 2017-11-29 DIAGNOSIS — H43813 Vitreous degeneration, bilateral: Secondary | ICD-10-CM | POA: Diagnosis not present

## 2017-11-29 DIAGNOSIS — H1789 Other corneal scars and opacities: Secondary | ICD-10-CM | POA: Diagnosis not present

## 2017-12-06 ENCOUNTER — Ambulatory Visit (INDEPENDENT_AMBULATORY_CARE_PROVIDER_SITE_OTHER): Payer: Medicare HMO

## 2017-12-06 DIAGNOSIS — I480 Paroxysmal atrial fibrillation: Secondary | ICD-10-CM | POA: Diagnosis not present

## 2017-12-06 DIAGNOSIS — I2699 Other pulmonary embolism without acute cor pulmonale: Secondary | ICD-10-CM

## 2017-12-18 ENCOUNTER — Encounter: Payer: Self-pay | Admitting: Family Medicine

## 2017-12-18 ENCOUNTER — Ambulatory Visit (INDEPENDENT_AMBULATORY_CARE_PROVIDER_SITE_OTHER): Payer: Medicare HMO | Admitting: Family Medicine

## 2017-12-18 VITALS — BP 128/82 | HR 82 | Temp 97.6°F | Ht 70.0 in | Wt 220.0 lb

## 2017-12-18 DIAGNOSIS — R631 Polydipsia: Secondary | ICD-10-CM | POA: Diagnosis not present

## 2017-12-18 DIAGNOSIS — K2100 Gastro-esophageal reflux disease with esophagitis, without bleeding: Secondary | ICD-10-CM

## 2017-12-18 DIAGNOSIS — I10 Essential (primary) hypertension: Secondary | ICD-10-CM | POA: Diagnosis not present

## 2017-12-18 DIAGNOSIS — E559 Vitamin D deficiency, unspecified: Secondary | ICD-10-CM

## 2017-12-18 DIAGNOSIS — R358 Other polyuria: Secondary | ICD-10-CM

## 2017-12-18 DIAGNOSIS — I480 Paroxysmal atrial fibrillation: Secondary | ICD-10-CM | POA: Diagnosis not present

## 2017-12-18 DIAGNOSIS — R3589 Other polyuria: Secondary | ICD-10-CM

## 2017-12-18 DIAGNOSIS — E7800 Pure hypercholesterolemia, unspecified: Secondary | ICD-10-CM | POA: Diagnosis not present

## 2017-12-18 DIAGNOSIS — R0602 Shortness of breath: Secondary | ICD-10-CM | POA: Diagnosis not present

## 2017-12-18 LAB — BAYER DCA HB A1C WAIVED: HB A1C: 5.7 % (ref <7.0)

## 2017-12-18 NOTE — Patient Instructions (Addendum)
Medicare Annual Wellness Visit  [facility] and the medical providers at [facility] strive to bring you the best medical care.  In doing so we not only want to address your current medical conditions and concerns but also to detect new conditions early and prevent illness, disease and health-related problems.    Medicare offers a yearly Wellness Visit which allows our clinical staff to assess your need for preventative services including immunizations, lifestyle education, counseling to decrease risk of preventable diseases and screening for fall risk and other medical concerns.    This visit is provided free of charge (no copay) for all Medicare recipients. The clinical pharmacists at [facility] have begun to conduct these Wellness Visits which will also include a thorough review of all your medications.    As you primary medical provider recommend that you make an appointment for your Annual Wellness Visit if you have not done so already this year.  You may set up this appointment before you leave today or you may call back ([phone]) and schedule an appointment.  Please make sure when you call that you mention that you are scheduling your Annual Wellness Visit with the clinical pharmacist so that the appointment may be made for the proper length of time   Continue current medications. Continue good therapeutic lifestyle changes which include good diet and exercise. Fall precautions discussed with patient. If an FOBT was given today- please return it to our front desk. If you are over 28 years old - you may need Prevnar 68 or the adult Pneumonia vaccine.  **Flu shots are available--- please call and schedule a FLU-CLINIC appointment**  After your visit with [place] today you will receive a survey in the mail or online from [employer] regarding your care with [place]. Please take a moment to fill this out. Your feedback is very important  to [place] as you can help [place] better understand your patient needs as well as improve your experience and satisfaction. WE CARE ABOUT YOU!!!   Continue to work on weight loss through diet and exercise Stay away from carbs and sugar as much as possible We will call with lab work results as soon as those results become available and will make sure that the cardiologist gets a copy of this report Continue to drink plenty of water and fluids

## 2017-12-18 NOTE — Progress Notes (Signed)
Subjective:    Patient ID: Alison Garrett, female    DOB: 10-Feb-1946, 72 y.o.   MRN: [id number]  HPI Pt here for follow up and management of chronic medical problems which includes hyperlipidemia, htn, and a fib. She is taking medication regularly.  She is doing well overall.  Her vital signs are stable.  It is of note that she has had 2 or 3 visits to the emergency room during the month of September.  She had abdominal pain and it was felt like she just had some type of viral issue with bloating.  She also presented with some chest pain and dehydration at another time.  Troponins were negative CBC was within normal limits the BMP had a creatinine that was slightly elevated.  During 1 of the visit she did have plain films of the abdomen which showed an increased stool burden.  She did have coronary calcium scoring and this showed minimal obstructive coronary artery disease.  She apparently has seen Dr. Percival Spanish and has an event monitor.  Patient today denies any chest pain.  She still has shortness of breath episodes all the time and has to stop what she is doing and rest for this to go away.  She associates this with a rapid heartbeat.  She is currently having a monitor as already mentioned and will follow up with the cardiologist.  She denies any nausea vomiting diarrhea blood in the stool or black tarry bowel movements.  She is passing her water without problems.  She recently had an eye exam on October 18 and everything was stable with this.  The hospitalizations were reviewed with her during the visit.  The ER notes were also reviewed.   Patient Active Problem List   Diagnosis Date Noted  . Palpitations 11/27/2017  . Chest pain with moderate risk for cardiac etiology 11/05/2017  . SOB (shortness of breath) 05/22/2017  . Chest pain 07/01/2016  . Paresthesias 06/07/2015  . Chronic kidney disease, stage 3 ([place]) 11/17/2014  . Atrial fibrillation with RVR ([place]) 11/16/2014  . Multiple drug allergies  08/11/2014  . Fatigue 01/12/2014  . Essential tremor 01/12/2014  . Metabolic syndrome 84/66/5993  . Hyperlipemia 09/17/2012  . Vitamin D deficiency 09/17/2012  . Fibromyalgia 09/17/2012  . Atrial fibrillation ([place]) 07/11/2011  . Obesity, unspecified 12/01/2008  . HYPERTENSION, BENIGN 12/01/2008  . ABNORMAL ELECTROCARDIOGRAM 12/01/2008   Outpatient Encounter Medications as of 12/18/2017  Medication Sig  . apixaban (ELIQUIS) 5 MG TABS tablet Take 1 tablet (5 mg total) by mouth 2 (two) times daily. Begin after starter pack is completed.  . hydrochlorothiazide (HYDRODIURIL) 25 MG tablet Take 0.5 tablets (12.5 mg total) by mouth daily.   No facility-administered encounter medications on file as of 12/18/2017.       Review of Systems  Constitutional: Negative.   HENT: Negative.   Eyes: Negative.   Respiratory: Negative.   Cardiovascular: Positive for palpitations ("racing heart").  Gastrointestinal: Negative.   Endocrine: Negative.   Genitourinary: Negative.   Musculoskeletal: Negative.   Skin: Negative.   Allergic/Immunologic: Negative.   Neurological: Negative.   Hematological: Negative.   Psychiatric/Behavioral: Negative.        Objective:   Physical Exam  Constitutional: She is oriented to person, place, and time. She appears well-developed and well-nourished. No distress.  Patient is pleasant and alert and relates her history well.  She does have a lot of allergies to medicines.  HENT:  Head: Normocephalic and atraumatic.  Right Ear:  External ear normal.  Left Ear: External ear normal.  Mouth/Throat: Oropharynx is clear and moist. No oropharyngeal exudate.  Turbinate congestion bilaterally left greater than right  Eyes: Pupils are equal, round, and reactive to light. Conjunctivae and EOM are normal. Right eye exhibits no discharge. Left eye exhibits no discharge. No scleral icterus.  Recent eye exam was normal  Neck: Normal range of motion. Neck supple. No thyromegaly  present.  No bruits thyromegaly or anterior cervical adenopathy  Cardiovascular: Normal rate, regular rhythm, normal heart sounds and intact distal pulses.  No murmur heard. Heart is regular this morning at 72/min with good pedal pulses and no edema  Pulmonary/Chest: Effort normal and breath sounds normal. She has no wheezes. She has no rales.  Clear anteriorly and posteriorly  Abdominal: Soft. Bowel sounds are normal. She exhibits no mass. There is no tenderness.  No liver or spleen enlargement.  No masses.  No epigastric tenderness.  Good inguinal pulses with no adenopathy.  Musculoskeletal: Normal range of motion. She exhibits no edema.  Lymphadenopathy:    She has no cervical adenopathy.  Neurological: She is alert and oriented to person, place, and time. She has normal reflexes. No cranial nerve deficit.  Reflexes are 1+ and equal bilaterally in the lower extremities  Skin: Skin is warm and dry. No rash noted.  Psychiatric: She has a normal mood and affect. Her behavior is normal. Judgment and thought content normal.  The patient's mood affect and behavior are all normal for her.  She is concerned about the ongoing problems with shortness of breath and heart racing and S1 reason she is wearing this monitor and will follow up with a cardiologist.  Nursing note and vitals reviewed.  BP 128/82 (BP Location: Left Arm)   Pulse 82   Temp 97.6 F (36.4 C) (Oral)   Ht 5' 10"  (1.778 m)   Wt 220 lb (99.8 kg)   BMI 31.57 kg/m         Assessment & Plan:  1. HYPERTENSION, BENIGN -The blood pressure is good today and she will continue with current treatment and sodium restriction - BMP8+EGFR - CBC with Differential/Platelet - Hepatic function panel  2. Gastroesophageal reflux disease with esophagitis -No complaints today with reflux or abdominal pain - CBC with Differential/Platelet - Hepatic function panel  3. Vitamin D deficiency - CBC with Differential/Platelet - VITAMIN D 25  Hydroxy (Vit-D Deficiency, Fractures)  4. Pure hypercholesterolemia -Continue with as aggressive therapeutic lifestyle changes as possible to achieve weight loss and control cholesterol and any elevated sugar issues. - CBC with Differential/Platelet - Lipid panel  5. Paroxysmal atrial fibrillation (HCC) -Follow-up with cardiology as planned -Continue to watch caffeine intake - CBC with Differential/Platelet  6. Polydipsia - Bayer DCA Hb A1c Waived  7. Polyuria -Hemoglobin A1c  8. Shortness of breath -Continue to wear monitor and follow-up with cardiologymed  No orders of the defined types were placed in this encounter.  Patient Instructions                       Medicare Annual Wellness Visit  [facility] and the medical providers at [facility] strive to bring you the best medical care.  In doing so we not only want to address your current medical conditions and concerns but also to detect new conditions early and prevent illness, disease and health-related problems.    Medicare offers a yearly Wellness Visit which allows our clinical staff to  assess your need for preventative services including immunizations, lifestyle education, counseling to decrease risk of preventable diseases and screening for fall risk and other medical concerns.    This visit is provided free of charge (no copay) for all Medicare recipients. The clinical pharmacists at [facility] have begun to conduct these Wellness Visits which will also include a thorough review of all your medications.    As you primary medical provider recommend that you make an appointment for your Annual Wellness Visit if you have not done so already this year.  You may set up this appointment before you leave today or you may call back ([phone]) and schedule an appointment.  Please make sure when you call that you mention that you are scheduling your Annual Wellness Visit with the  clinical pharmacist so that the appointment may be made for the proper length of time   Continue current medications. Continue good therapeutic lifestyle changes which include good diet and exercise. Fall precautions discussed with patient. If an FOBT was given today- please return it to our front desk. If you are over 40 years old - you may need Prevnar 31 or the adult Pneumonia vaccine.  **Flu shots are available--- please call and schedule a FLU-CLINIC appointment**  After your visit with [place] today you will receive a survey in the mail or online from [employer] regarding your care with [place]. Please take a moment to fill this out. Your feedback is very important to [place] as you can help [place] better understand your patient needs as well as improve your experience and satisfaction. WE CARE ABOUT YOU!!!   Continue to work on weight loss through diet and exercise Stay away from carbs and sugar as much as possible We will call with lab work results as soon as those results become available and will make sure that the cardiologist gets a copy of this report Continue to drink plenty of water and fluids  Arrie Senate MD   .

## 2017-12-19 LAB — BMP8+EGFR
BUN/Creatinine Ratio: 18 (ref 12–28)
BUN: 20 mg/dL (ref 8–27)
CALCIUM: 9.2 mg/dL (ref 8.7–10.3)
CHLORIDE: 102 mmol/L (ref 96–106)
CO2: 22 mmol/L (ref 20–29)
Creatinine, Ser: 1.1 mg/dL — ABNORMAL HIGH (ref 0.57–1.00)
GFR calc Af Amer: 58 mL/min/{1.73_m2} — ABNORMAL LOW (ref >59)
GFR, EST NON AFRICAN AMERICAN: 51 mL/min/{1.73_m2} — AB (ref >59)
Glucose: 97 mg/dL (ref 65–99)
Potassium: 3.9 mmol/L (ref 3.5–5.2)
Sodium: 141 mmol/L (ref 134–144)

## 2017-12-19 LAB — HEPATIC FUNCTION PANEL
ALK PHOS: 109 IU/L (ref 39–117)
ALT: 28 IU/L (ref 0–32)
AST: 22 IU/L (ref 0–40)
Albumin: 4 g/dL (ref 3.5–4.8)
BILIRUBIN, DIRECT: 0.18 mg/dL (ref 0.00–0.40)
Bilirubin Total: 0.8 mg/dL (ref 0.0–1.2)
TOTAL PROTEIN: 6.5 g/dL (ref 6.0–8.5)

## 2017-12-19 LAB — LIPID PANEL
CHOLESTEROL TOTAL: 177 mg/dL (ref 100–199)
Chol/HDL Ratio: 4.7 ratio — ABNORMAL HIGH (ref 0.0–4.4)
HDL: 38 mg/dL — AB (ref >39)
LDL Calculated: 115 mg/dL — ABNORMAL HIGH (ref 0–99)
TRIGLYCERIDES: 120 mg/dL (ref 0–149)
VLDL CHOLESTEROL CAL: 24 mg/dL (ref 5–40)

## 2017-12-19 LAB — CBC WITH DIFFERENTIAL/PLATELET
BASOS ABS: 0.1 10*3/uL (ref 0.0–0.2)
Basos: 1 %
EOS (ABSOLUTE): 0.1 10*3/uL (ref 0.0–0.4)
Eos: 1 %
Hematocrit: 42.2 % (ref 34.0–46.6)
Hemoglobin: 15 g/dL (ref 11.1–15.9)
IMMATURE GRANS (ABS): 0 10*3/uL (ref 0.0–0.1)
Immature Granulocytes: 1 %
LYMPHS: 25 %
Lymphocytes Absolute: 1.7 10*3/uL (ref 0.7–3.1)
MCH: 30.3 pg (ref 26.6–33.0)
MCHC: 35.5 g/dL (ref 31.5–35.7)
MCV: 85 fL (ref 79–97)
MONOCYTES: 8 %
Monocytes Absolute: 0.5 10*3/uL (ref 0.1–0.9)
NEUTROS PCT: 64 %
Neutrophils Absolute: 4.2 10*3/uL (ref 1.4–7.0)
PLATELETS: 236 10*3/uL (ref 150–450)
RBC: 4.95 x10E6/uL (ref 3.77–5.28)
RDW: 13.2 % (ref 12.3–15.4)
WBC: 6.5 10*3/uL (ref 3.4–10.8)

## 2017-12-19 LAB — VITAMIN D 25 HYDROXY (VIT D DEFICIENCY, FRACTURES): Vit D, 25-Hydroxy: 22 ng/mL — ABNORMAL LOW (ref 30.0–100.0)

## 2017-12-23 ENCOUNTER — Other Ambulatory Visit: Payer: Medicare HMO

## 2017-12-23 DIAGNOSIS — Z1211 Encounter for screening for malignant neoplasm of colon: Secondary | ICD-10-CM | POA: Diagnosis not present

## 2017-12-24 LAB — FECAL OCCULT BLOOD, IMMUNOCHEMICAL: FECAL OCCULT BLD: NEGATIVE

## 2017-12-25 DIAGNOSIS — I480 Paroxysmal atrial fibrillation: Secondary | ICD-10-CM | POA: Diagnosis not present

## 2018-01-01 ENCOUNTER — Telehealth: Payer: Self-pay | Admitting: *Deleted

## 2018-01-01 NOTE — Telephone Encounter (Signed)
Left message on voicemail to call back to review results and orders as below:  Minus Breeding, MD 12/29/2017 Routine    Narrative & Impression    NSR, One run of NSVT.  SVT lasting 23 seconds and less frequent episodes  PVCs and PACs relatively infrequent.       Notes recorded by Minus Breeding, MD on 12/31/2017 at 8:57 PM EST SVT. She could try metoprolol XL 25 mg po daily. Disp number 30 with 11 refills. Call Ms. [place] with the results and send results to Chipper Herb, MD

## 2018-01-01 NOTE — Telephone Encounter (Signed)
New message:      Pt is returning a call for results. Pt states she will try and call the [facility] office to reach you.

## 2018-01-01 NOTE — Telephone Encounter (Signed)
Returned call to pt's home number.  LM to c/b

## 2018-01-02 NOTE — Telephone Encounter (Signed)
Attempted to contact patient on both numbers listed in her chart.  LM on vm to c/b for results.

## 2018-01-02 NOTE — Telephone Encounter (Signed)
Follow up   Patient is returning call for test results.

## 2018-01-02 NOTE — Telephone Encounter (Signed)
Follow up    Please call patient at cell phone # on file

## 2018-01-03 ENCOUNTER — Other Ambulatory Visit: Payer: Self-pay

## 2018-01-03 MED ORDER — METOPROLOL SUCCINATE ER 25 MG PO TB24: 25.0000 mg | ORAL_TABLET | Freq: Every day | ORAL | 11 refills | Status: DC

## 2018-01-03 NOTE — Telephone Encounter (Signed)
Follow up   Patient is returning call for test results.

## 2018-01-03 NOTE — Telephone Encounter (Signed)
Called patient, gave results, advised of new medication, sent into pharmacy. Patient verbalized understanding.

## 2018-01-31 ENCOUNTER — Other Ambulatory Visit: Payer: Self-pay | Admitting: Family Medicine

## 2018-02-17 NOTE — Progress Notes (Signed)
HPI The patient presents for evaluation of atrial fibrillation.  She was in the hospital in Sept for chest pain and was found to have a small PE.  She was sent home on anticoagulation.  She returns for follow up.   She was having palpitations.  She wore a Zio patch.  She was found to have SVT for 23 seconds. It was suggested that she start metoprolol.  However, she had previously been intolerant of this and did not tolerate this.  It made her feel poorly on multiple levels.  She stopped it.  She says she is getting rapid heart rate doing any activities but is not her atrial fibrillation.  She is not having any presyncope or syncope.  Quite distressed by the fact not doing very much.  She denies any chest pressure, neck or arm discomfort.  She denies any frank syncope or presyncope.  She does have a lot of stress at work and she is anxious.   Allergies  Allergen Reactions  . Contrast Media [Iodinated Diagnostic Agents] Anaphylaxis  . Penicillins Anaphylaxis  . Sulfonamide Derivatives Hives  . Benicar [Olmesartan] Rash  . Celebrex [Celecoxib] Anxiety  . Cymbalta [Duloxetine Hcl] Itching  . Latex Rash  . Neurontin [Gabapentin] Nausea Only  . Norvasc [Amlodipine Besylate] Swelling  . Prozac [Fluoxetine Hcl] Other (See Comments)  . Xanax [Alprazolam] Other (See Comments)  . Zoloft [Sertraline Hcl] Itching  . Zyprexa [Olanzapine] Other (See Comments)    Wt gain    Current Outpatient Medications  Medication Sig Dispense Refill  . apixaban (ELIQUIS) 5 MG TABS tablet Take 1 tablet (5 mg total) by mouth 2 (two) times daily. Begin after starter pack is completed. 180 tablet 3  . hydrochlorothiazide (HYDRODIURIL) 25 MG tablet TAKE 1/2 (ONE-HALF) TABLET BY MOUTH ONCE DAILY 45 tablet 0   No current facility-administered medications for this visit.     Past Medical History:  Diagnosis Date  . Afib ([facility])   . Complication of anesthesia    propathal   . Dyslipidemia   . Essential tremor  01/12/2014  . Fibromyalgia    Not currently under active treatment  . GERD (gastroesophageal reflux disease)   . Hiatal hernia   . Hypertension   . Obesity     Past Surgical History:  Procedure Laterality Date  . ABDOMINAL HYSTERECTOMY       ROS: As stated in the HPI and negative for all other systems.   PHYSICAL EXAM BP (!) 144/90   Pulse 83   Ht 5\' 10"  (1.778 m)   Wt 221 lb (100.2 kg)   BMI 31.71 kg/m   GENERAL:  Well appearing NECK:  No jugular venous distention, waveform within normal limits, carotid upstroke brisk and symmetric, no bruits, no thyromegaly LUNGS:  Clear to auscultation bilaterally CHEST:  Unremarkable HEART:  PMI not displaced or sustained,S1 and S2 within normal limits, no S3, no S4, no clicks, no rubs, no murmurs ABD:  Flat, positive bowel sounds normal in frequency in pitch, no bruits, no rebound, no guarding, no midline pulsatile mass, no hepatomegaly, no splenomegaly EXT:  2 plus pulses throughout, no edema, no cyanosis no clubbing   Lab Results  Component Value Date   TSH 1.56 10/07/2015   EKG:   Sinus rhythm, rate 83, axis within normal limits, intervals within normal limits, no acute ST-T wave changes.   ASSESSMENT AND PLAN   ATRIAL FIB:    Ms. NYEEMAH JENNETTE has a CHA2DS2 -  VASc score of 3 with a risk of stroke of 3.2%.    However, the symptoms that she is describing mostly is not the atrial fibrillation.  She will continue with the meds as listed.  I am adding beta-blocker as an intolerance.  She is also not tolerated Cardizem in the past.  I will add that as an intolerance.  She will continue with anticoagulation alone.   PALPITATIONS: She has an SVT which appears to be an atrial tachycardia.  It is likely that this is not an ablation for rhythm.  However, she would like consultation with our EP service and I will arrange this.  We discussed the possibility verapamil but she would rather try to avoid any other medications.  We had a long  discussion about improving her vagal tone and I gave her specific discussion about exercise.  We also talked about stress management.   HTN:  Her blood pressure is at target.  No change in therapy.   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

## 2018-02-19 ENCOUNTER — Encounter: Payer: Self-pay | Admitting: Cardiology

## 2018-02-19 ENCOUNTER — Ambulatory Visit: Payer: Medicare HMO | Admitting: Cardiology

## 2018-02-19 VITALS — BP 144/90 | HR 83 | Ht 70.0 in | Wt 221.0 lb

## 2018-02-19 DIAGNOSIS — I2699 Other pulmonary embolism without acute cor pulmonale: Secondary | ICD-10-CM

## 2018-02-19 DIAGNOSIS — I10 Essential (primary) hypertension: Secondary | ICD-10-CM | POA: Diagnosis not present

## 2018-02-19 DIAGNOSIS — I480 Paroxysmal atrial fibrillation: Secondary | ICD-10-CM

## 2018-02-19 DIAGNOSIS — I471 Supraventricular tachycardia: Secondary | ICD-10-CM

## 2018-02-19 DIAGNOSIS — Z86711 Personal history of pulmonary embolism: Secondary | ICD-10-CM | POA: Insufficient documentation

## 2018-02-19 NOTE — Patient Instructions (Signed)
Medication Instructions:  The current medical regimen is effective;  continue present plan and medications.  If you need a refill on your cardiac medications before your next appointment, please call your pharmacy.   Follow-Up: Follow up with Dr Percival Spanish in 4 months in [place].  Thank you for choosing [facility]!!

## 2018-03-19 ENCOUNTER — Ambulatory Visit: Payer: Medicare HMO | Admitting: Cardiology

## 2018-03-19 ENCOUNTER — Encounter: Payer: Self-pay | Admitting: Cardiology

## 2018-03-19 VITALS — BP 126/76 | HR 71 | Ht 70.0 in | Wt 224.0 lb

## 2018-03-19 DIAGNOSIS — I471 Supraventricular tachycardia: Secondary | ICD-10-CM | POA: Diagnosis not present

## 2018-03-19 DIAGNOSIS — I480 Paroxysmal atrial fibrillation: Secondary | ICD-10-CM | POA: Diagnosis not present

## 2018-03-19 NOTE — Patient Instructions (Signed)
Medication Instructions:    Your physician recommends that you continue on your current medications as directed. Please refer to the Current Medication list given to you today.  - If you need a refill on your cardiac medications before your next appointment, please call your pharmacy.   Labwork:  None ordered  Testing/Procedures:  None ordered  Follow-Up:  Your physician recommends that you schedule a follow-up appointment in: 3 months with Dr. Camnitz.  Thank you for choosing [facility]!!   Verne Lanuza, RN [phone]  Any Other Special Instructions Will Be Listed Below (If Applicable).       

## 2018-03-19 NOTE — Progress Notes (Signed)
Electrophysiology Office Note   Date:  03/19/2018   ID:  Alison Garrett, DOB 01/16/1946, MRN [id number]  PCP:  Chipper Herb, MD  Cardiologist:  [facility] Primary Electrophysiologist:  Demarrius Guerrero Meredith Leeds, MD    No chief complaint on file.    History of Present Illness: Alison Garrett is a 73 y.o. female who is being seen today for the evaluation of atrial fibrillation, SVT at the request of Minus Breeding, MD. Presenting today for electrophysiology evaluation.  She has a history of hypertension and hyperlipidemia.  She presented to the hospital September 2019 with chest pain and was found to have a small PE.  At follow-up she was noted to have palpitations and wore a ZIO patch that showed episodes of SVT for up to 23 seconds.  She was started on metoprolol, though she did not tolerate this.  She has been getting palpitations doing activities, though she does not feel like this is due to her atrial fibrillation.  Her symptoms occur most days.  They occur mainly when she is exerting herself.  She says that her heart rate gets up to 110 bpm which makes her feel poorly.  She did wear a cardiac monitor that showed symptoms at times associated with SVT and at times associated with normal rhythm.    Today, she denies symptoms of palpitations, chest pain, shortness of breath, orthopnea, PND, lower extremity edema, claudication, dizziness, presyncope, syncope, bleeding, or neurologic sequela. The patient is tolerating medications without difficulties.    Past Medical History:  Diagnosis Date  . Afib ([place])   . Complication of anesthesia    propathal   . Dyslipidemia   . Essential tremor 01/12/2014  . Fibromyalgia    Not currently under active treatment  . GERD (gastroesophageal reflux disease)   . Hiatal hernia   . Hypertension   . Obesity    Past Surgical History:  Procedure Laterality Date  . ABDOMINAL HYSTERECTOMY       Current Outpatient Medications  Medication Sig Dispense  Refill  . apixaban (ELIQUIS) 5 MG TABS tablet Take 1 tablet (5 mg total) by mouth 2 (two) times daily. Begin after starter pack is completed. 180 tablet 3  . hydrochlorothiazide (HYDRODIURIL) 25 MG tablet TAKE 1/2 (ONE-HALF) TABLET BY MOUTH ONCE DAILY 45 tablet 0   No current facility-administered medications for this visit.     Allergies:   Contrast media [iodinated diagnostic agents]; Diltiazem; Metoprolol; Penicillins; Sulfonamide derivatives; Benicar [olmesartan]; Celebrex [celecoxib]; Cymbalta [duloxetine hcl]; Latex; Neurontin [gabapentin]; Norvasc [amlodipine besylate]; Prozac [fluoxetine hcl]; Xanax [alprazolam]; Zoloft [sertraline hcl]; and Zyprexa [olanzapine]   Social History:  The patient  reports that she has never smoked. She has never used smokeless tobacco. She reports that she does not drink alcohol or use drugs.   Family History:  The patient's family history includes COPD in her father; Cancer in her mother; Coronary artery disease in her mother; Diabetes in her sister; Heart disease in her mother; Hepatitis in her sister.    ROS:  Please see the history of present illness.   Otherwise, review of systems is positive for palpitations, shortness of breath, back pain.   All other systems are reviewed and negative.    PHYSICAL EXAM: VS:  There were no vitals taken for this visit. , BMI There is no height or weight on file to calculate BMI. GEN: Well nourished, well developed, in no acute distress  HEENT: normal  Neck: no JVD, carotid bruits, or masses Cardiac:  RRR; no murmurs, rubs, or gallops,no edema  Respiratory:  clear to auscultation bilaterally, normal work of breathing GI: soft, nontender, nondistended, + BS MS: no deformity or atrophy  Skin: warm and dry Neuro:  Strength and sensation are intact Psych: euthymic mood, full affect  EKG:  EKG is not ordered today. Personal review of the ekg ordered 02/20/18 shows Sr, rate 83  Recent Labs: 09/30/2017: Magnesium  2.1 11/05/2017: B Natriuretic Peptide 29.4 12/18/2017: ALT 28; BUN 20; Creatinine, Ser 1.10; Hemoglobin 15.0; Platelets 236; Potassium 3.9; Sodium 141    Lipid Panel     Component Value Date/Time   CHOL 177 12/18/2017 1117   CHOL 170 06/04/2012 0925   TRIG 120 12/18/2017 1117   TRIG CANCELED 08/13/2015 0816   TRIG 94 06/04/2012 0925   HDL 38 (L) 12/18/2017 1117   HDL CANCELED 08/13/2015 0816   HDL 41 06/04/2012 0925   CHOLHDL 4.7 (H) 12/18/2017 1117   LDLCALC 115 (H) 12/18/2017 1117   LDLCALC 105 (H) 06/18/2013 0834   LDLCALC 110 (H) 06/04/2012 0925     Wt Readings from Last 3 Encounters:  02/19/18 221 lb (100.2 kg)  12/18/17 220 lb (99.8 kg)  11/27/17 219 lb (99.3 kg)      Other studies Reviewed: Additional studies/ records that were reviewed today include: 30 day monitor 12/29/17  Review of the above records today demonstrates:  NSR, One run of NSVT.  SVT lasting 23 seconds and less frequent episodes  PVCs and PACs relatively infrequent.     ASSESSMENT AND PLAN:  1.  Paroxysmal atrial fibrillation: Currently on Eliquis.  Minimal atrial fibrillation throughout the year.  No changes.  This patients CHA2DS2-VASc Score and unadjusted Ischemic Stroke Rate (% per year) is equal to 3.2 % stroke rate/year from a score of 3  Above score calculated as 1 point each if present [CHF, HTN, DM, Vascular=MI/PAD/Aortic Plaque, Age if 65-74, or Female] Above score calculated as 2 points each if present [Age > 75, or Stroke/TIA/TE]  2.  SVT: Vagal maneuvers have been discussed.  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  I am unsure if we [name] be able to do much testing on EP study.  I have told her that these arrhythmias only put her at risk of noticing palpitations and shortness of breath.  She [name] try to exercise and see if that helps.  3.  Hypertension: Well-controlled  4.  Pulmonary embolism: Currently on Eliquis indefinitely.    Current  medicines are reviewed at length with the patient today.   The patient does not have concerns regarding her medicines.  The following changes were made today:  none  Labs/ tests ordered today include:  No orders of the defined types were placed in this encounter.  Case discussed with primary cardiology  Disposition:   FU with Merranda Bolls 3 months  Signed, Nyree Applegate Meredith Leeds, MD  03/19/2018 1:46 PM     [place] [address] [place] [address] [phone] (office) [phone] (fax)

## 2018-04-24 ENCOUNTER — Other Ambulatory Visit: Payer: Self-pay | Admitting: Family Medicine

## 2018-05-15 IMAGING — CT CT HEAD W/O CM
4 series · 17 of 47 positions shown, 19 images · non-contrast
Comparison: None.

CLINICAL DATA: Head injury 2 weeks ago

EXAM:
CT HEAD WITHOUT CONTRAST
TECHNIQUE: Contiguous axial images were obtained from the base of the skull
through the vertex without intravenous contrast.

[Series 2: head w/o · axial · non-contrast · 0.41mm/px · z∈[+1048,+1168]mm · 8 of 40 slices shown, 10 images]
[im 5/40  brain]
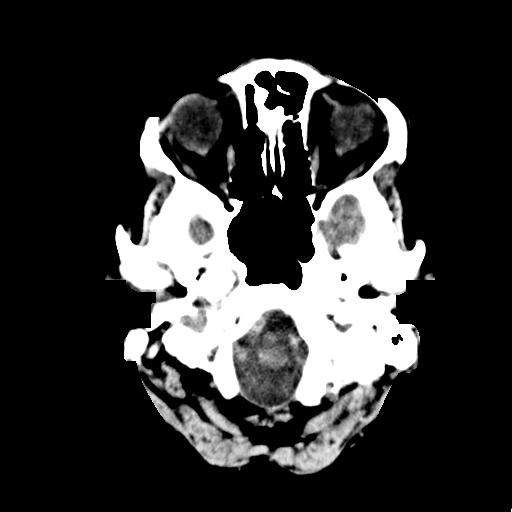
[im 5/40  bone]
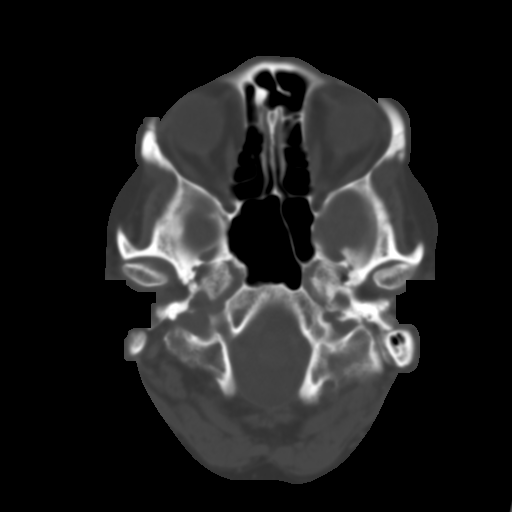
[im 9/40  brain]
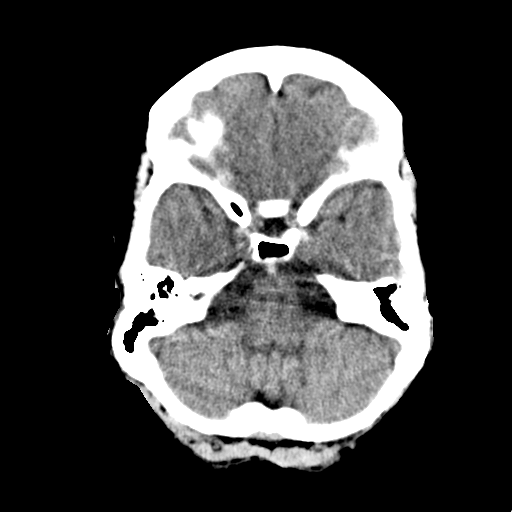
[im 14/40  brain]
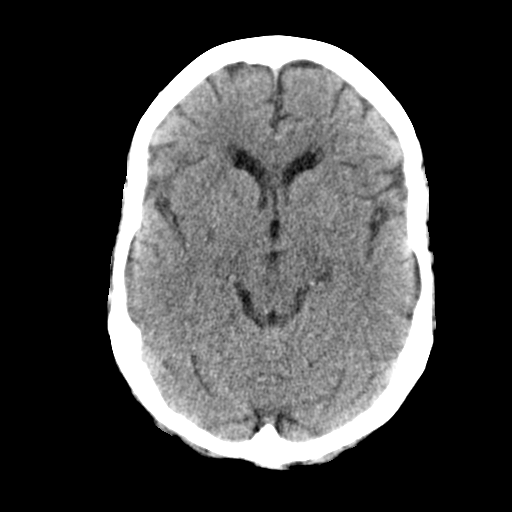
[im 18/40  brain]
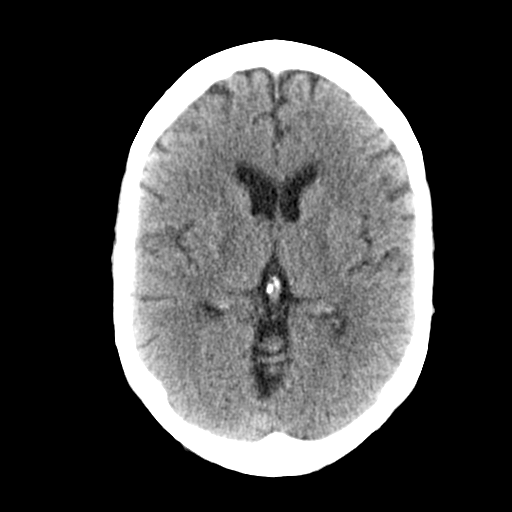
[im 22/40  brain]
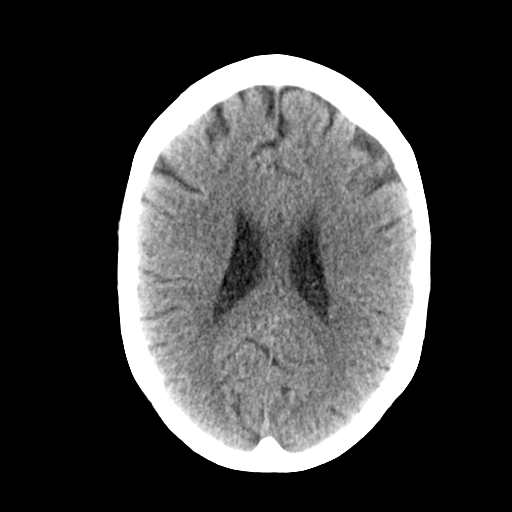
[im 22/40  bone]
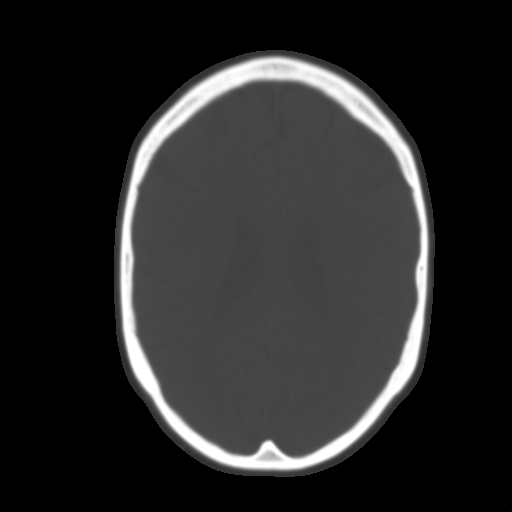
[im 27/40  brain]
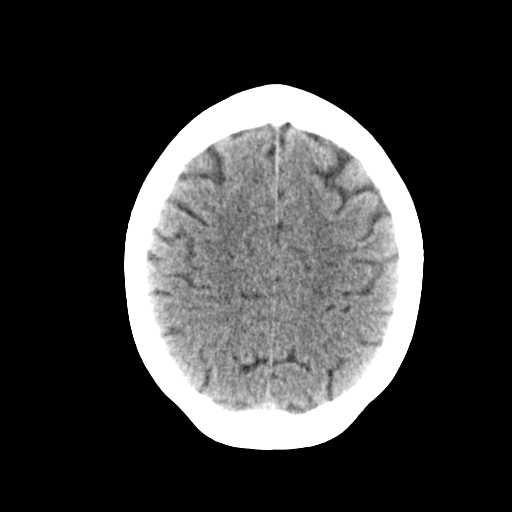
[im 31/40  brain]
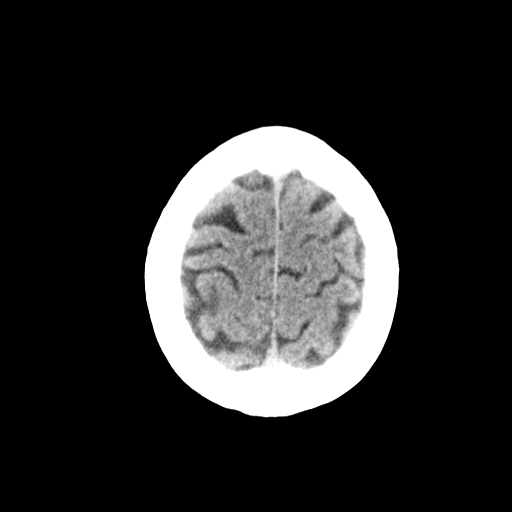
[im 35/40  brain]
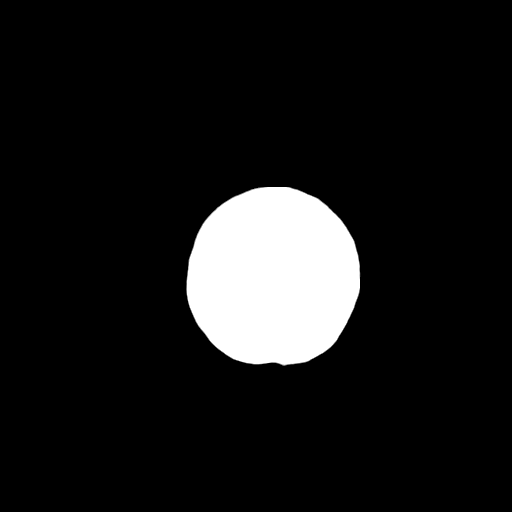

[Series 3: head bone · axial · 0.41mm/px · z∈[+1048,+1080]mm · 3 of 80 slices shown]
[im 9/80  bone]
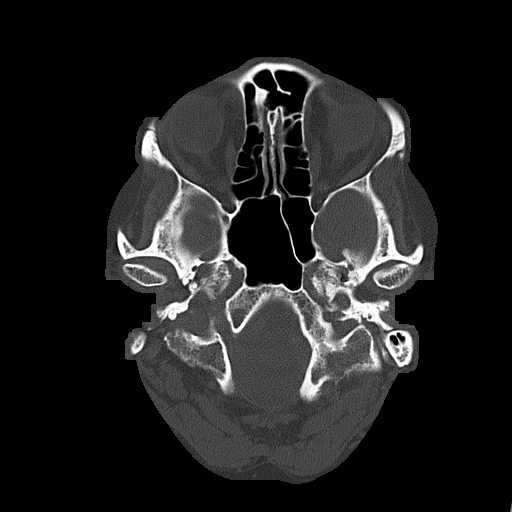
[im 17/80  bone]
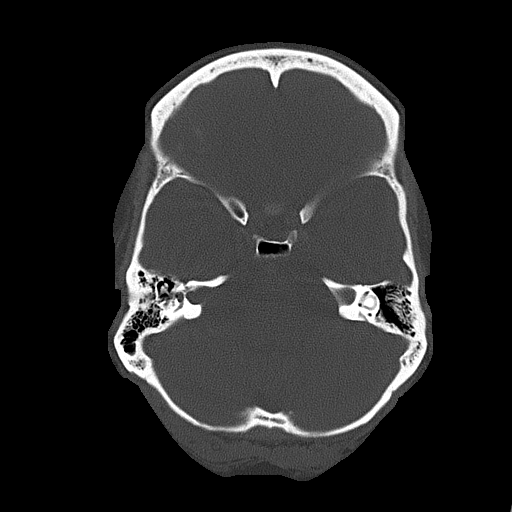
[im 25/80  bone]
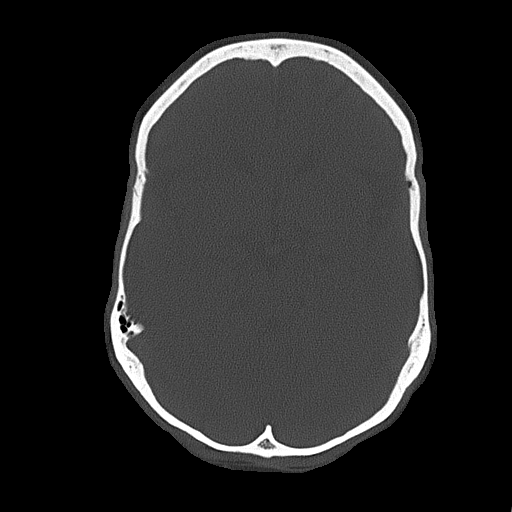

[Series 4: coronal · coronal · 0.34mm/px · 3 of 64 slices shown]
[im 22/64  brain]
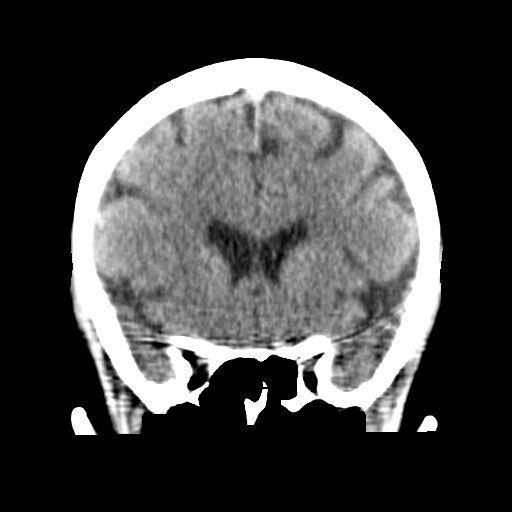
[im 29/64  brain]
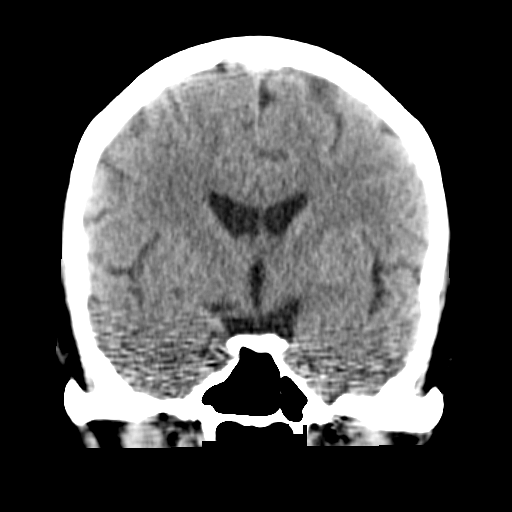
[im 36/64  brain]
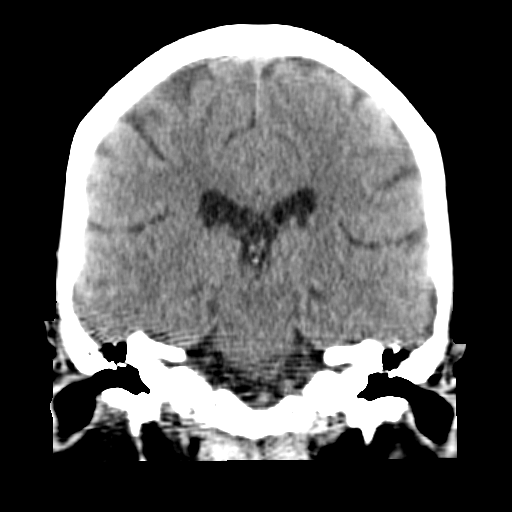

[Series 5: sagittal · sagittal · 0.34mm/px · 3 of 49 slices shown]
[im 17/49  brain]
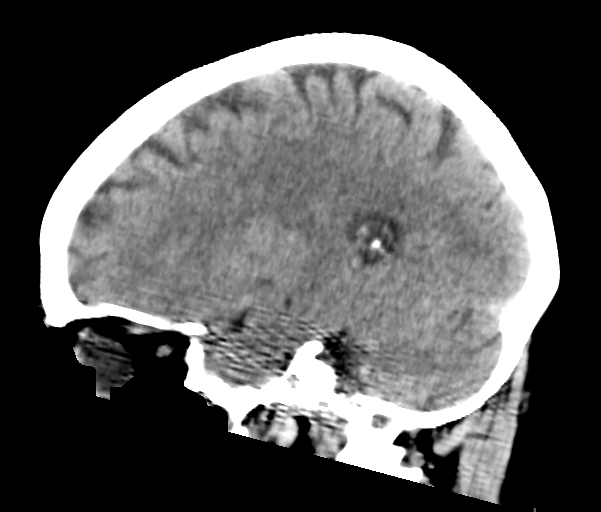
[im 25/49  brain]
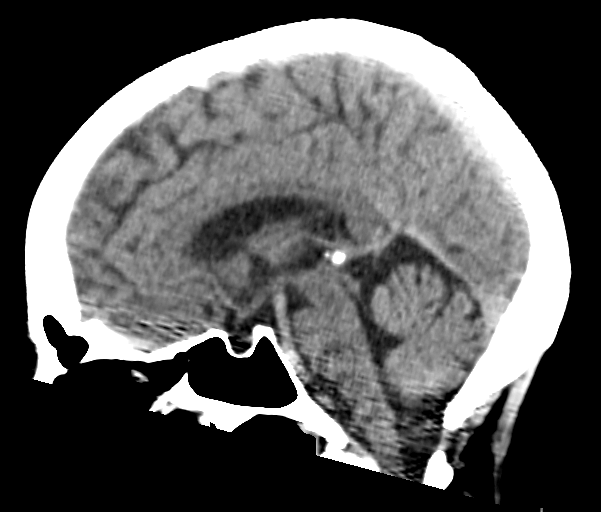
[im 33/49  brain]
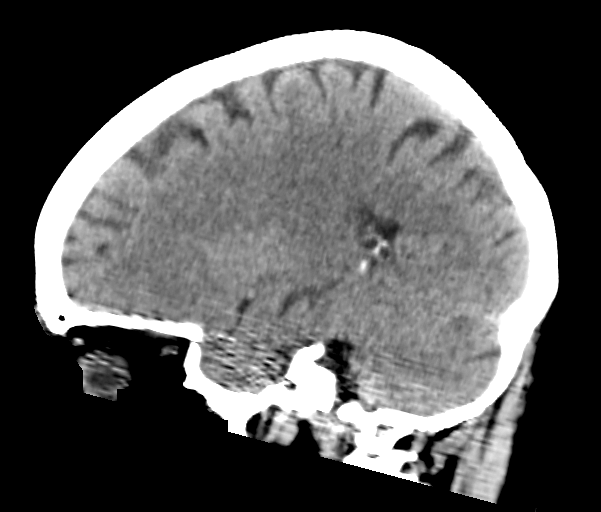

[17 of 47 positions shown; findings below may reference images not displayed]

FINDINGS: Brain: No intracranial hemorrhage, mass effect or midline shift. No
hydrocephalus. The gray and white-matter differentiation is
preserved. No intra or extra-axial fluid collection. No acute
cortical infarction. No mass lesion is noted on this unenhanced
scan.

Vascular: No hyperdense vessel or unexpected calcification.

Skull: Negative for fracture or focal lesion.

Sinuses/Orbits: No acute findings.

Other: None.
IMPRESSION: No acute intracranial abnormality.  No acute cortical infarction.

## 2018-06-18 ENCOUNTER — Encounter: Payer: Self-pay | Admitting: Family Medicine

## 2018-06-18 ENCOUNTER — Ambulatory Visit (INDEPENDENT_AMBULATORY_CARE_PROVIDER_SITE_OTHER): Payer: Medicare HMO | Admitting: Family Medicine

## 2018-06-18 ENCOUNTER — Other Ambulatory Visit: Payer: Self-pay

## 2018-06-18 DIAGNOSIS — Z889 Allergy status to unspecified drugs, medicaments and biological substances status: Secondary | ICD-10-CM | POA: Diagnosis not present

## 2018-06-18 DIAGNOSIS — I480 Paroxysmal atrial fibrillation: Secondary | ICD-10-CM | POA: Diagnosis not present

## 2018-06-18 DIAGNOSIS — E7800 Pure hypercholesterolemia, unspecified: Secondary | ICD-10-CM | POA: Diagnosis not present

## 2018-06-18 DIAGNOSIS — M797 Fibromyalgia: Secondary | ICD-10-CM

## 2018-06-18 DIAGNOSIS — I10 Essential (primary) hypertension: Secondary | ICD-10-CM | POA: Diagnosis not present

## 2018-06-18 DIAGNOSIS — I1310 Hypertensive heart and chronic kidney disease without heart failure, with stage 1 through stage 4 chronic kidney disease, or unspecified chronic kidney disease: Secondary | ICD-10-CM

## 2018-06-18 DIAGNOSIS — K2100 Gastro-esophageal reflux disease with esophagitis, without bleeding: Secondary | ICD-10-CM

## 2018-06-18 DIAGNOSIS — I2699 Other pulmonary embolism without acute cor pulmonale: Secondary | ICD-10-CM

## 2018-06-18 DIAGNOSIS — N183 Chronic kidney disease, stage 3 unspecified: Secondary | ICD-10-CM

## 2018-06-18 DIAGNOSIS — G250 Essential tremor: Secondary | ICD-10-CM

## 2018-06-18 DIAGNOSIS — E559 Vitamin D deficiency, unspecified: Secondary | ICD-10-CM | POA: Diagnosis not present

## 2018-06-18 NOTE — Patient Instructions (Signed)
Continue current medications Continue follow-up with cardiology and electrophysiologist Continue to practice good hand and respiratory hygiene Continue to exercise regularly and follow a diet that is healthy and low in cholesterol

## 2018-06-18 NOTE — Progress Notes (Signed)
Virtual Visit Via telephone Note I connected with patient on 06/18/18 by telephone and verified that I am speaking with the correct person or authorized healthcare agent using two identifiers.Alison Garrett is currently located at home and there are no unauthorized people in close proximity. I completed this visit while in a private location in my home. I connected to the patient by telephone and verified that I was speaking with the correct person..  This visit type was conducted due to national recommendations for restrictions regarding the COVID-19 Pandemic (e.g. social distancing).  This format is felt to be most appropriate for this patient at this time.  All issues noted in this document were discussed and addressed.  No physical exam was performed.    I discussed the limitations, risks, security and privacy concerns of performing an evaluation and management service by telephone and the availability of in person appointments. I also discussed with the patient that there may be a patient responsible charge related to this service. The patient expressed understanding and agreed to proceed.   Date:  06/18/2018    ID:  Alison Garrett      06/01/1945        [id number]   Patient Care Team Patient Care Team: Chipper Herb, MD as PCP - General (Family Medicine) Minus Breeding, MD as PCP - Cardiology (Cardiology) Constance Haw, MD as PCP - Electrophysiology (Cardiology)  Reason for Visit: Primary Care Follow-up     History of Present Illness & Review of Systems:     Alison Garrett is a 73 y.o. year old female primary care patient that presents today for a telehealth visit.  Patient is doing well overall.  Her ongoing and biggest problem seems to be the supraventricular tachycardia.  This is an all day problem every day and because of any kind of increased activity she feels her heart racing almost immediately up to 120 or more.  She is always reluctant to take new medicines and do  things that might help her and I encouraged her to continue to follow-up with a cardiologist and electrophysiologist if they are considering an ablation and get this done so her lifestyle will be better every day.  She has these ongoing palpitations constantly and this limits her physical activity because every time she is active palpitations occur.  With the palpitation she has shortness of breath other than that no shortness of breath.  She denies any trouble with her stomach including nausea vomiting diarrhea blood in the stool black tarry bowel movements or change in bowel habits.  She is passing her water well without symptoms no burning pain or frequency.  She is up-to-date on her eye exams.  She continues to work at [employer] and does not see any people other than the person she works with who works in a different area.  Review of systems as stated otherwise negative for body systems left unmentioned.   The patient does not have symptoms concerning for COVID-19 infection (fever, chills, cough, or new shortness of breath).      Current Medications (Verified) Allergies as of 06/18/2018      Reactions   Contrast Media [iodinated Diagnostic Agents] Anaphylaxis   Diltiazem Other (See Comments)   Pt. Had pain, jitters and felt terrible   Metoprolol Nausea And Vomiting   Penicillins Anaphylaxis   Has patient had a PCN reaction causing immediate rash, facial/tongue/throat swelling, SOB or lightheadedness with hypotension: Yes Has  patient had a PCN reaction causing severe rash involving mucus membranes or skin necrosis: Yes Has patient had a PCN reaction that required hospitalization unknown Has patient had a PCN reaction occurring within the last 10 years: No If all of the above answers are "NO", then may proceed with Cephalo   Sulfonamide Derivatives Hives   Benicar [olmesartan] Rash   Celebrex [celecoxib] Anxiety   Cymbalta [duloxetine Hcl] Itching   Latex Rash   Neurontin  [gabapentin] Nausea Only   Norvasc [amlodipine Besylate] Swelling   Prozac [fluoxetine Hcl] Other (See Comments)   Xanax [alprazolam] Other (See Comments)   Zoloft [sertraline Hcl] Itching   Zyprexa [olanzapine] Other (See Comments)   Wt gain      Medication List       Accurate as of Jun 18, 2018  8:59 AM. Always use your most recent med list.        apixaban 5 MG Tabs tablet Commonly known as:  ELIQUIS Take 1 tablet (5 mg total) by mouth 2 (two) times daily. Begin after starter pack is completed.   hydrochlorothiazide 25 MG tablet Commonly known as:  HYDRODIURIL Take 1/2 (one-half) tablet by mouth once daily          Allergies (Verified)    Contrast media [iodinated diagnostic agents]; Diltiazem; Metoprolol; Penicillins; Sulfonamide derivatives; Benicar [olmesartan]; Celebrex [celecoxib]; Cymbalta [duloxetine hcl]; Latex; Neurontin [gabapentin]; Norvasc [amlodipine besylate]; Prozac [fluoxetine hcl]; Xanax [alprazolam]; Zoloft [sertraline hcl]; and Zyprexa [olanzapine]  Past Medical History Past Medical History:  Diagnosis Date  . Afib ([place])   . Complication of anesthesia    propathal   . Dyslipidemia   . Essential tremor 01/12/2014  . Fibromyalgia    Not currently under active treatment  . GERD (gastroesophageal reflux disease)   . Hiatal hernia   . Hypertension   . Obesity      Past Surgical History:  Procedure Laterality Date  . ABDOMINAL HYSTERECTOMY      Social History   Socioeconomic History  . Marital status: Divorced    Spouse name: Not on file  . Number of children: 3  . Years of education: some college  . Highest education level: Some college, no degree  Occupational History  . Occupation: Unemployed and disabled  Social Needs  . Financial resource strain: Not hard at all  . Food insecurity:    Worry: Never true    Inability: Never true  . Transportation needs:    Medical: No    Non-medical: No  Tobacco Use  . Smoking status: Never  Smoker  . Smokeless tobacco: Never Used  Substance and Sexual Activity  . Alcohol use: No    Alcohol/week: 0.0 standard drinks  . Drug use: No  . Sexual activity: Not Currently  Lifestyle  . Physical activity:    Days per week: 0 days    Minutes per session: 0 min  . Stress: Only a little  Relationships  . Social connections:    Talks on phone: More than three times a week    Gets together: More than three times a week    Attends religious service: Never    Active member of club or organization: Yes    Attends meetings of clubs or organizations: More than 4 times per year    Relationship status: Divorced  Other Topics Concern  . Not on file  Social History Narrative   Lives in [place].   Patient is right handed   Drinks very little caffeine.  Family History  Problem Relation Age of Onset  . Coronary artery disease Mother   . Heart disease Mother        CABG  . Cancer Mother        trachea  . COPD Father   . Hepatitis Sister        autoimune  . Diabetes Sister       Labs/Other Tests and Data Reviewed:    Wt Readings from Last 3 Encounters:  03/19/18 224 lb (101.6 kg)  02/19/18 221 lb (100.2 kg)  12/18/17 220 lb (99.8 kg)   Temp Readings from Last 3 Encounters:  12/18/17 97.6 F (36.4 C) (Oral)  11/10/17 97.8 F (36.6 C) (Oral)  11/07/17 98.1 F (36.7 C) (Oral)   BP Readings from Last 3 Encounters:  03/19/18 126/76  02/19/18 (!) 144/90  12/18/17 128/82   Pulse Readings from Last 3 Encounters:  03/19/18 71  02/19/18 83  12/18/17 82     Lab Results  Component Value Date   HGBA1C 5.7 12/18/2017   HGBA1C 5.5 02/18/2013   Lab Results  Component Value Date   LDLCALC 115 (H) 12/18/2017   CREATININE 1.10 (H) 12/18/2017       Chemistry      Component Value Date/Time   NA 141 12/18/2017 1117   K 3.9 12/18/2017 1117   CL 102 12/18/2017 1117   CO2 22 12/18/2017 1117   BUN 20 12/18/2017 1117   CREATININE 1.10 (H) 12/18/2017 1117    CREATININE 0.99 06/04/2012 0925      Component Value Date/Time   CALCIUM 9.2 12/18/2017 1117   ALKPHOS 109 12/18/2017 1117   AST 22 12/18/2017 1117   ALT 28 12/18/2017 1117   BILITOT 0.8 12/18/2017 1117         OBSERVATIONS/ OBJECTIVE:     The patient is alert and a good historian.  Her blood pressure is 128/78 and the pulse runs on average at about 85.  Her weight is about 218.  She is using personal protective equipment when out but she does not use this at work because she has no working relationship with another person at [employer] that is close by to her.  She does have follow-up visit scheduled with her cardiologist and with electrophysiologist and she plans to follow through with follow-up on these and hopefully get an ablation to end these episodes of rapid heartbeat.  Physical exam deferred due to nature of telephonic visit.  ASSESSMENT & PLAN    Time:   Today, I have spent 25 minutes with the patient via telephone discussing the above including Covid precautions.     Visit Diagnoses: 1. HYPERTENSION, BENIGN -Pressures are good at home and she will continue with her current treatment regimen and sodium restriction and she will come by the office and get pertinent lab work like a BMP to make sure renal function is okay.  2. Vitamin D deficiency -She does have vitamin D deficiency but does not but does not treat this.  3. Paroxysmal atrial fibrillation (HCC) -Continue to follow-up with cardiology and electrophysiologist  4. Gastroesophageal reflux disease with esophagitis -No complaints today with reflux  5. Pure hypercholesterolemia -Cholesterol numbers have been elevated but patient does not take medicine because of sensitivity to medication and will continue with as aggressive therapeutic lifestyle changes as possible.  6. Multiple drug allergies -This is an ongoing problem for her as well as her sister and seems to have been passed down from her  mother's  side.  7. Fibromyalgia -Tylenol for pain and stay as active as possible  8. Other pulmonary embolism without acute cor pulmonale, unspecified chronicity (HCC) -Continue Eliquis  9. 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 Essential tremor -No complaints of this today.  No refills were needed. Patient will come by the office for routine lab work to include a thyroid panel and vitamin D level.   Patient Instructions  Continue current medications Continue follow-up with cardiology and electrophysiologist Continue to practice good hand and respiratory hygiene Continue to exercise regularly and follow a diet that is healthy and low in cholesterol     The above assessment and management plan was discussed with the patient. The patient verbalized understanding of and has agreed to the management plan. Patient is aware to call the clinic if symptoms persist or worsen. Patient is aware when to return to the clinic for a follow-up visit. Patient educated on when it is appropriate to go to the emergency department.    Chipper Herb, MD [address], [place], [place] [address] Ph [phone]   Arrie Senate MD

## 2018-06-18 NOTE — Progress Notes (Deleted)
Virtual Visit Via telephone Note I connected with patient on 06/18/18 by telephone and verified that I am speaking with the correct person or authorized healthcare agent using two identifiers. Alison Garrett is currently located at home and there are no unauthorized people in close proximity. I completed this visit while in a private location in my home. I connected to the patient by telephone and verified that I was speaking with the correct person..  This visit type was conducted due to national recommendations for restrictions regarding the COVID-19 Pandemic (e.g. social distancing).  This format is felt to be most appropriate for this patient at this time.  All issues noted in this document were discussed and addressed.  No physical exam was performed.    I discussed the limitations, risks, security and privacy concerns of performing an evaluation and management service by telephone and the availability of in person appointments. I also discussed with the patient that there may be a patient responsible charge related to this service. The patient expressed understanding and agreed to proceed.   Date:  06/18/2018    ID:  Alison Garrett      Alison Garrett        [id number]   Patient Care Team Patient Care Team: Chipper Herb, MD as PCP - General (Family Medicine) Minus Breeding, MD as PCP - Cardiology (Cardiology) Constance Haw, MD as PCP - Electrophysiology (Cardiology)  Reason for Visit: Primary Care Follow-up     History of Present Illness & Review of Systems:     Alison Garrett is a 73 y.o. year old female primary care patient that presents today for a telehealth visit.  Patient is doing well overall.  Her ongoing and biggest problem seems to be the supraventricular tachycardia.  This is an all day problem every day and because of any kind of increased activity she feels her heart racing almost immediately up to 120 or more.  She is always reluctant to take new medicines and do things  that might help her and I encouraged her to continue to follow-up with a cardiologist and electrophysiologist if they are considering an ablation and get this done so her lifestyle will be better every day.  She has these ongoing palpitations constantly and this limits her physical activity because every time she is active palpitations occur.  With the palpitation she has shortness of breath other than that no shortness of breath.  She denies any trouble with her stomach including nausea vomiting diarrhea blood in the stool black tarry bowel movements or change in bowel habits.  She is passing her water well without symptoms no burning pain or frequency.  She is up-to-date on her eye exams.  She continues to work at [employer] and does not see any people other than the person she works with who works in a different area.  Review of systems as stated otherwise negative for body systems left unmentioned.   The patient does not have symptoms concerning for COVID-19 infection (fever, chills, cough, or new shortness of breath).      Current Medications (Verified) Allergies as of 06/18/2018      Reactions   Contrast Media [iodinated Diagnostic Agents] Anaphylaxis   Diltiazem Other (See Comments)   Pt. Had pain, jitters and felt terrible   Metoprolol Nausea And Vomiting   Penicillins Anaphylaxis   Has patient had a PCN reaction causing immediate rash, facial/tongue/throat swelling, SOB or lightheadedness with hypotension: Yes Has patient had a  PCN reaction causing severe rash involving mucus membranes or skin necrosis: Yes Has patient had a PCN reaction that required hospitalization Unknown Has patient had a PCN reaction occurring within the last 10 years: no If all of the above answers are "NO", then may proceed with Cephalo   Sulfonamide Derivatives Hives   Benicar [olmesartan] Rash   Celebrex [celecoxib] Anxiety   Cymbalta [duloxetine Hcl] Itching   Latex Rash   Neurontin [gabapentin]  Nausea Only   Norvasc [amlodipine Besylate] Swelling   Prozac [fluoxetine Hcl] Other (See Comments)   Xanax [alprazolam] Other (See Comments)   Zoloft [sertraline Hcl] Itching   Zyprexa [olanzapine] Other (See Comments)   Wt gain      Medication List       Accurate as of Jun 18, 2018  8:59 AM. Always use your most recent med list.        apixaban 5 MG Tabs tablet Commonly known as:  ELIQUIS Take 1 tablet (5 mg total) by mouth 2 (two) times daily. Begin after starter pack is completed.   hydrochlorothiazide 25 MG tablet Commonly known as:  HYDRODIURIL Take 1/2 (one-half) tablet by mouth once daily          Allergies (Verified)    Contrast media [iodinated diagnostic agents]; Diltiazem; Metoprolol; Penicillins; Sulfonamide derivatives; Benicar [olmesartan]; Celebrex [celecoxib]; Cymbalta [duloxetine hcl]; Latex; Neurontin [gabapentin]; Norvasc [amlodipine besylate]; Prozac [fluoxetine hcl]; Xanax [alprazolam]; Zoloft [sertraline hcl]; and Zyprexa [olanzapine]  Past Medical History Past Medical History:  Diagnosis Date  . Afib ([place])   . Complication of anesthesia    propathal   . Dyslipidemia   . Essential tremor 01/12/2014  . Fibromyalgia    Not currently under active treatment  . GERD (gastroesophageal reflux disease)   . Hiatal hernia   . Hypertension   . Obesity      Past Surgical History:  Procedure Laterality Date  . ABDOMINAL HYSTERECTOMY      Social History   Socioeconomic History  . Marital status: Divorced    Spouse name: Not on file  . Number of children: 3  . Years of education: some college  . Highest education level: Some college, no degree  Occupational History  . Occupation: Unemployed and disabled  Social Needs  . Financial resource strain: Not hard at all  . Food insecurity:    Worry: Never true    Inability: Never true  . Transportation needs:    Medical: No    Non-medical: No  Tobacco Use  . Smoking status: Never Smoker  .  Smokeless tobacco: Never Used  Substance and Sexual Activity  . Alcohol use: No    Alcohol/week: 0.0 standard drinks  . Drug use: No  . Sexual activity: Not Currently  Lifestyle  . Physical activity:    Days per week: 0 days    Minutes per session: 0 min  . Stress: Only a little  Relationships  . Social connections:    Talks on phone: More than three times a week    Gets together: More than three times a week    Attends religious service: Never    Active member of club or organization: Yes    Attends meetings of clubs or organizations: More than 4 times per year    Relationship status: Divorced  Other Topics Concern  . Not on file  Social History Narrative   Lives in [place].   Patient is right handed   Drinks very little caffeine.     Family  History  Problem Relation Age of Onset  . Coronary artery disease Mother   . Heart disease Mother        CABG  . Cancer Mother        trachea  . COPD Father   . Hepatitis Sister        autoimune  . Diabetes Sister       Labs/Other Tests and Data Reviewed:    Wt Readings from Last 3 Encounters:  03/19/18 224 lb (101.6 kg)  02/19/18 221 lb (100.2 kg)  12/18/17 220 lb (99.8 kg)   Temp Readings from Last 3 Encounters:  12/18/17 97.6 F (36.4 C) (Oral)  11/10/17 97.8 F (36.6 C) (Oral)  11/07/17 98.1 F (36.7 C) (Oral)   BP Readings from Last 3 Encounters:  03/19/18 126/76  02/19/18 (!) 144/90  12/18/17 128/82   Pulse Readings from Last 3 Encounters:  03/19/18 71  02/19/18 83  12/18/17 82     Lab Results  Component Value Date   HGBA1C 5.7 12/18/2017   HGBA1C 5.5 02/18/2013   Lab Results  Component Value Date   LDLCALC 115 (H) 12/18/2017   CREATININE 1.10 (H) 12/18/2017       Chemistry      Component Value Date/Time   NA 141 12/18/2017 1117   K 3.9 12/18/2017 1117   CL 102 12/18/2017 1117   CO2 22 12/18/2017 1117   BUN 20 12/18/2017 1117   CREATININE 1.10 (H) 12/18/2017 1117   CREATININE 0.99  06/04/2012 0925      Component Value Date/Time   CALCIUM 9.2 12/18/2017 1117   ALKPHOS 109 12/18/2017 1117   AST 22 12/18/2017 1117   ALT 28 12/18/2017 1117   BILITOT 0.8 12/18/2017 1117         OBSERVATIONS/ OBJECTIVE:     The patient is alert and a good historian.  Her blood pressure is 128/78 and the pulse runs on average at about 85.  Her weight is about 218.  She is using personal protective equipment when out but she does not use this at work because she has no working relationship with another person at [employer] that is close by to her.  She does have follow-up visit scheduled with her cardiologist and with electrophysiologist and she plans to follow through with follow-up on these and hopefully get an ablation to end these episodes of rapid heartbeat.  Physical exam deferred due to nature of telephonic visit.  ASSESSMENT & PLAN    Time:   Today, I have spent 25 minutes with the patient via telephone discussing the above including Covid precautions.     Visit Diagnoses: 1. HYPERTENSION, BENIGN -Pressures are good at home and she will continue with her current treatment regimen and sodium restriction and she will come by the office and get pertinent lab work like a BMP to make sure renal function is okay.  2. Vitamin D deficiency -She does have vitamin D deficiency but does not but does not treat this.  3. Paroxysmal atrial fibrillation (HCC) -Continue to follow-up with cardiology and electrophysiologist  4. Gastroesophageal reflux disease with esophagitis -No complaints today with reflux  5. Pure hypercholesterolemia -Cholesterol numbers have been elevated but patient does not take medicine because of sensitivity to medication and will continue with as aggressive therapeutic lifestyle changes as possible.  6. Multiple drug allergies -This is an ongoing problem for her as well as her sister and seems to have been passed down from her mother's  side.  7.  Fibromyalgia -Tylenol for pain and stay as active as possible  8. Other pulmonary embolism without acute cor pulmonale, unspecified chronicity (HCC) -Continue Eliquis  9. 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 Essential tremor -No complaints of this today.  No refills were needed. Patient will come by the office for routine lab work to include a thyroid panel and vitamin D level.   Patient Instructions  Continue current medications Continue follow-up with cardiology and electrophysiologist Continue to practice good hand and respiratory hygiene Continue to exercise regularly and follow a diet that is healthy and low in cholesterol     The above assessment and management plan was discussed with the patient. The patient verbalized understanding of and has agreed to the management plan. Patient is aware to call the clinic if symptoms persist or worsen. Patient is aware when to return to the clinic for a follow-up visit. Patient educated on when it is appropriate to go to the emergency department.    Chipper Herb, MD [facility] [address], [place], [place] [address] Ph [phone]   Arrie Senate MD

## 2018-06-19 ENCOUNTER — Other Ambulatory Visit: Payer: Medicare HMO

## 2018-06-19 ENCOUNTER — Telehealth: Payer: Self-pay | Admitting: Cardiology

## 2018-06-19 ENCOUNTER — Other Ambulatory Visit: Payer: Self-pay

## 2018-06-19 DIAGNOSIS — G250 Essential tremor: Secondary | ICD-10-CM

## 2018-06-19 DIAGNOSIS — I10 Essential (primary) hypertension: Secondary | ICD-10-CM | POA: Diagnosis not present

## 2018-06-19 DIAGNOSIS — N183 Chronic kidney disease, stage 3 (moderate): Secondary | ICD-10-CM

## 2018-06-19 DIAGNOSIS — M797 Fibromyalgia: Secondary | ICD-10-CM | POA: Diagnosis not present

## 2018-06-19 DIAGNOSIS — I2699 Other pulmonary embolism without acute cor pulmonale: Secondary | ICD-10-CM | POA: Diagnosis not present

## 2018-06-19 DIAGNOSIS — K2100 Gastro-esophageal reflux disease with esophagitis, without bleeding: Secondary | ICD-10-CM

## 2018-06-19 DIAGNOSIS — I1310 Hypertensive heart and chronic kidney disease without heart failure, with stage 1 through stage 4 chronic kidney disease, or unspecified chronic kidney disease: Secondary | ICD-10-CM

## 2018-06-19 DIAGNOSIS — E559 Vitamin D deficiency, unspecified: Secondary | ICD-10-CM | POA: Diagnosis not present

## 2018-06-19 DIAGNOSIS — I480 Paroxysmal atrial fibrillation: Secondary | ICD-10-CM

## 2018-06-19 DIAGNOSIS — Z889 Allergy status to unspecified drugs, medicaments and biological substances status: Secondary | ICD-10-CM

## 2018-06-19 DIAGNOSIS — E7800 Pure hypercholesterolemia, unspecified: Secondary | ICD-10-CM

## 2018-06-19 NOTE — Telephone Encounter (Signed)
CONSENT OBTAINED ° ° °YOUR CARDIOLOGY TEAM HAS ARRANGED FOR AN E-VISIT FOR YOUR APPOINTMENT - PLEASE REVIEW IMPORTANT INFORMATION BELOW SEVERAL DAYS PRIOR TO YOUR APPOINTMENT ° °Due to the recent COVID-19 pandemic, we are transitioning in-person office visits to tele-medicine visits in an effort to decrease unnecessary exposure to our patients and staff. Medicare and most insurances are covering these visits without a copay needed. We also encourage you to sign up for MyChart if you have not already done so. You will need a smartphone if possible. For patients that do not have this, we can still complete the visit using a regular telephone but do prefer a smartphone to enable video when possible. You may have a close family member that lives with you that can help. If possible, we also ask that you have a blood pressure cuff and scale at home to measure your blood pressure, heart rate and weight prior to your scheduled appointment. Patients with clinical needs that need an in-person evaluation and testing will still be able to come to the office if absolutely necessary. If you have any questions, feel free to call our office. ° ° ° °IF YOU HAVE A SMARTPHONE, PLEASE DOWNLOAD THE WEBEX APP TO YOUR SMARTPHONE ° °- If Apple, go to App Store and type in WebEx in the search bar. Download [employer] Webex Meetings, the blue/green circle. The app is free but as with any other app download, your phone may require you to verify saved payment information or Apple password. You do NOT have to create a WebEx account. ° °- If Android, go to Google Play Store and type in WebEx in the search bar. Download [employer] Webex Meetings, the blue/green circle. The app is free but as with any other app download, your phone may require you to verify saved payment information or Android password. You do NOT have to create a WebEx account. ° °It is very helpful to have this downloaded before your visit. ° ° ° °2-3 DAYS BEFORE YOUR APPOINTMENT ° °You  will receive a telephone call from one of our [facility] team members - your caller ID may say "Unknown caller." If this is a video visit, we will confirm that you have been able to download the WebEx app. We will remind you check your blood pressure, heart rate and weight prior to your scheduled appointment. If you have an Apple Watch or Kardia, please upload any pertinent ECG strips the day before or morning of your appointment to MyChart. Our staff will also make sure you have reviewed the consent and agree to move forward with your scheduled tele-health visit.  ° ° ° °THE DAY OF YOUR APPOINTMENT ° °Approximately 15 minutes prior to your scheduled appointment, you will receive a telephone call from one of [facility] team - your caller ID may say "Unknown caller."  Our staff will confirm medications, vital signs for the day and any symptoms you may be experiencing. Please have this information available prior to the time of visit start. It may also be helpful for you to have a pad of paper and pen handy for any instructions given during your visit. They will also walk you through joining the WebEx smartphone meeting if this is a video visit. ° ° ° °CONSENT FOR TELE-HEALTH VISIT - PLEASE REVIEW ° °I hereby voluntarily request, consent and authorize [facility] and its employed or contracted physicians, physician assistants, nurse practitioners or other licensed health care professionals (the Practitioner), to provide me with telemedicine health care   services (the “Services") as deemed necessary by the treating Practitioner. I acknowledge and consent to receive the Services by the Practitioner via telemedicine. I understand that the telemedicine visit will involve communicating with the Practitioner through live audiovisual communication technology and the disclosure of certain medical information by electronic transmission. I acknowledge that I have been given the opportunity to request an in-person assessment or  other available alternative prior to the telemedicine visit and am voluntarily participating in the telemedicine visit. ° °I understand that I have the right to withhold or withdraw my consent to the use of telemedicine in the course of my care at any time, without affecting my right to future care or treatment, and that the Practitioner or I may terminate the telemedicine visit at any time. I understand that I have the right to inspect all information obtained and/or recorded in the course of the telemedicine visit and may receive copies of available information for a reasonable fee.  I understand that some of the potential risks of receiving the Services via telemedicine include:  °• Delay or interruption in medical evaluation due to technological equipment failure or disruption; °• Information transmitted may not be sufficient (e.g. poor resolution of images) to allow for appropriate medical decision making by the Practitioner; and/or  °• In rare instances, security protocols could fail, causing a breach of personal health information. ° °Furthermore, I acknowledge that it is my responsibility to provide information about my medical history, conditions and care that is complete and accurate to the best of my ability. I acknowledge that Practitioner's advice, recommendations, and/or decision may be based on factors not within their control, such as incomplete or inaccurate data provided by me or distortions of diagnostic images or specimens that may result from electronic transmissions. I understand that the practice of medicine is not an exact science and that Practitioner makes no warranties or guarantees regarding treatment outcomes. I acknowledge that I will receive a copy of this consent concurrently upon execution via email to the email address I last provided but may also request a printed copy by calling the office of [facility].   ° °I understand that my insurance will be billed for this visit.  ° °I  have read or had this consent read to me. °• I understand the contents of this consent, which adequately explains the benefits and risks of the Services being provided via telemedicine.  °• I have been provided ample opportunity to ask questions regarding this consent and the Services and have had my questions answered to my satisfaction. °• I give my informed consent for the services to be provided through the use of telemedicine in my medical care ° °By participating in this telemedicine visit I agree to the above. ° °

## 2018-06-20 LAB — LIPID PANEL
Chol/HDL Ratio: 4.7 ratio — ABNORMAL HIGH (ref 0.0–4.4)
Cholesterol, Total: 183 mg/dL (ref 100–199)
HDL: 39 mg/dL — ABNORMAL LOW (ref >39)
LDL Calculated: 117 mg/dL — ABNORMAL HIGH (ref 0–99)
Triglycerides: 134 mg/dL (ref 0–149)
VLDL Cholesterol Cal: 27 mg/dL (ref 5–40)

## 2018-06-20 LAB — BMP8+EGFR
BUN/Creatinine Ratio: 16 (ref 12–28)
BUN: 17 mg/dL (ref 8–27)
CO2: 26 mmol/L (ref 20–29)
Calcium: 9.7 mg/dL (ref 8.7–10.3)
Chloride: 101 mmol/L (ref 96–106)
Creatinine, Ser: 1.09 mg/dL — ABNORMAL HIGH (ref 0.57–1.00)
GFR calc Af Amer: 59 mL/min/{1.73_m2} — ABNORMAL LOW (ref >59)
GFR calc non Af Amer: 51 mL/min/{1.73_m2} — ABNORMAL LOW (ref >59)
Glucose: 104 mg/dL — ABNORMAL HIGH (ref 65–99)
Potassium: 4.6 mmol/L (ref 3.5–5.2)
Sodium: 143 mmol/L (ref 134–144)

## 2018-06-20 LAB — CBC WITH DIFFERENTIAL/PLATELET
Basophils Absolute: 0 10*3/uL (ref 0.0–0.2)
Basos: 1 %
EOS (ABSOLUTE): 0.1 10*3/uL (ref 0.0–0.4)
Eos: 1 %
Hematocrit: 48.2 % — ABNORMAL HIGH (ref 34.0–46.6)
Hemoglobin: 16 g/dL — ABNORMAL HIGH (ref 11.1–15.9)
Immature Grans (Abs): 0 10*3/uL (ref 0.0–0.1)
Immature Granulocytes: 0 %
Lymphocytes Absolute: 1.5 10*3/uL (ref 0.7–3.1)
Lymphs: 21 %
MCH: 28.6 pg (ref 26.6–33.0)
MCHC: 33.2 g/dL (ref 31.5–35.7)
MCV: 86 fL (ref 79–97)
Monocytes Absolute: 0.6 10*3/uL (ref 0.1–0.9)
Monocytes: 8 %
Neutrophils Absolute: 4.8 10*3/uL (ref 1.4–7.0)
Neutrophils: 69 %
Platelets: 249 10*3/uL (ref 150–450)
RBC: 5.6 x10E6/uL — ABNORMAL HIGH (ref 3.77–5.28)
RDW: 12.6 % (ref 11.7–15.4)
WBC: 7 10*3/uL (ref 3.4–10.8)

## 2018-06-20 LAB — HEPATIC FUNCTION PANEL
ALT: 30 IU/L (ref 0–32)
AST: 24 IU/L (ref 0–40)
Albumin: 3.9 g/dL (ref 3.7–4.7)
Alkaline Phosphatase: 107 IU/L (ref 39–117)
Bilirubin Total: 0.7 mg/dL (ref 0.0–1.2)
Bilirubin, Direct: 0.16 mg/dL (ref 0.00–0.40)
Total Protein: 7.2 g/dL (ref 6.0–8.5)

## 2018-06-20 LAB — VITAMIN D 25 HYDROXY (VIT D DEFICIENCY, FRACTURES): Vit D, 25-Hydroxy: 26.2 ng/mL — ABNORMAL LOW (ref 30.0–100.0)

## 2018-06-22 NOTE — Progress Notes (Signed)
Virtual Visit via Video Note   This visit type was conducted due to national recommendations for restrictions regarding the COVID-19 Pandemic (e.g. social distancing) in an effort to limit this patient's exposure and mitigate transmission in our community.  Due to her co-morbid illnesses, this patient is at least at moderate risk for complications without adequate follow up.  This format is felt to be most appropriate for this patient at this time.  All issues noted in this document were discussed and addressed.  A limited physical exam was performed with this format.  Please refer to the patient's chart for her consent to telehealth for [facility].   Date:  06/22/2018   ID:  Alison Garrett, DOB [birth year], MRN [id number]  Patient Location: Home Provider Location: Home  PCP:  Chipper Herb, MD  Cardiologist:  Minus Breeding, MD  Electrophysiologist:  Constance Haw, MD   Evaluation Performed:  Follow-Up Visit  Chief Complaint:  Palpitations  History of Present Illness:    Alison Garrett is a 73 y.o. female with for follow up of atrial fibrillation.  She was in the hospital in Sept 2019 for chest pain and was found to have a small PE.  She was sent home on anticoagulation.  She returns for follow up.   She wore a ZIO patch and was found to have SVT for 23 seconds.  She is intolerant of metoprolol.  She has multiple intolerances.  She did see Dr. Curt Bears and was offered an SVT ablation.  She wanted to think about it and talk about it with me.  At this point it still not entirely clear that her symptoms are related to this SVT.  She describes a rapid heart rate that happens when she gets up and moves around.  She says her heart rate to be in the 80s.  She will get up and move across the room and then it goes into the 1 teens.  Should come back and sit down or go back into the 80s.  She feels very symptomatic with this heart racing all the time.  She is not had any presyncope or  syncope.  She is not having any chest pressure, neck or arm discomfort.  She is not had any weight gain or edema.  She works full-time at [employer].   The patient does not have symptoms concerning for COVID-19 infection (fever, chills, cough, or new shortness of breath).    Past Medical History:  Diagnosis Date   Afib ([place])    Complication of anesthesia    propathal    Dyslipidemia    Essential tremor 01/12/2014   Fibromyalgia    Not currently under active treatment   GERD (gastroesophageal reflux disease)    Hiatal hernia    Hypertension    Obesity    Past Surgical History:  Procedure Laterality Date   ABDOMINAL HYSTERECTOMY       Prior to Admission medications   Medication Sig Start Date End Date Taking? Authorizing Provider  apixaban (ELIQUIS) 5 MG TABS tablet Take 1 tablet (5 mg total) by mouth 2 (two) times daily. Begin after starter pack is completed. 11/13/17  Yes Duke, Tami Lin, PA  hydrochlorothiazide (HYDRODIURIL) 25 MG tablet Take 1/2 (one-half) tablet by mouth once daily 04/24/18  Yes Chipper Herb, MD     Allergies:   Contrast media [iodinated diagnostic agents]; Diltiazem; Metoprolol; Penicillins; Sulfonamide derivatives; Benicar [olmesartan]; Celebrex [celecoxib]; Cymbalta [duloxetine hcl]; Latex; Neurontin [gabapentin]; Norvasc [  amlodipine besylate]; Prozac [fluoxetine hcl]; Xanax [alprazolam]; Zoloft [sertraline hcl]; and Zyprexa [olanzapine]   Social History   Tobacco Use   Smoking status: Never Smoker   Smokeless tobacco: Never Used  Substance Use Topics   Alcohol use: No    Alcohol/week: 0.0 standard drinks   Drug use: No     Family Hx: The patient's family history includes COPD in her father; Cancer in her mother; Coronary artery disease in her mother; Diabetes in her sister; Heart disease in her mother; Hepatitis in her sister.  ROS:   Please see the history of present illness.    As stated in the HPI and negative for  all other systems.    Prior CV studies:   The following studies were reviewed today:  EP note  Labs/Other Tests and Data Reviewed:    EKG:  No ECG reviewed.  Recent Labs: 09/30/2017: Magnesium 2.1 11/05/2017: B Natriuretic Peptide 29.4 06/19/2018: ALT 30; BUN 17; Creatinine, Ser 1.09; Hemoglobin 16.0; Platelets 249; Potassium 4.6; Sodium 143   Recent Lipid Panel Lab Results  Component Value Date/Time   CHOL 183 06/19/2018 08:03 AM   CHOL 170 06/04/2012 09:25 AM   TRIG 134 06/19/2018 08:03 AM   TRIG CANCELED 08/13/2015 08:16 AM   TRIG 94 06/04/2012 09:25 AM   HDL 39 (L) 06/19/2018 08:03 AM   HDL CANCELED 08/13/2015 08:16 AM   HDL 41 06/04/2012 09:25 AM   CHOLHDL 4.7 (H) 06/19/2018 08:03 AM   LDLCALC 117 (H) 06/19/2018 08:03 AM   LDLCALC 105 (H) 06/18/2013 08:34 AM   LDLCALC 110 (H) 06/04/2012 09:25 AM    Wt Readings from Last 3 Encounters:  03/19/18 224 lb (101.6 kg)  02/19/18 221 lb (100.2 kg)  12/18/17 220 lb (99.8 kg)     Objective:    Vital Signs:  There were no vitals taken for this visit.   VITAL SIGNS:  reviewed GEN:  no acute distress NEURO:  alert and oriented x 3, no obvious focal deficit PSYCH:  normal affect  ASSESSMENT & PLAN:     ATRIAL FIB:      For now she agrees to stay on the Eliquis.   She has not wanted to take this but did after the PE.  I think she needs it because of the history of atrial fib but she says she does not have this enough to want to stay on the blood thinner.     Alison Garrett has a CHA2DS2 - VASc score of 3 with a risk of stroke of 3.2%.    Of note her symptoms are not consistent with atrial fib.   PALPITATIONS:     I don't think this is the SVT but Dr. Curt Bears did offer ablation.  At this point we are going to try medical management again first.  We had discussed possibly verapamil.  She would consent to this.  Instead I will try pindolol starting at a low dose once daily and creeping up.  If not she would consider  ablation.   I will check a TSH at the next visit.   HTN:   The blood pressure is at target. No change in medications is indicated. We will continue with therapeutic lifestyle changes (TLC).  PULMONARY EMBOLISM:     She could come down to half dose with her unprovoked PE but because of the atrial fib she will stay at full dose for now.  She wants to revisit this in the future.  COVID-19 Education: The signs and symptoms of COVID-19 were discussed with the patient and how to seek care for testing (follow up with PCP or arrange E-visit).  The importance of social distancing was discussed today.  Time:   Today, I have spent 20 minutes with the patient with telehealth technology discussing the above problems.     Medication Adjustments/Labs and Tests Ordered: Current medicines are reviewed at length with the patient today.  Concerns regarding medicines are outlined above.   Tests Ordered: No orders of the defined types were placed in this encounter.   Medication Changes: No orders of the defined types were placed in this encounter.   Disposition:  Follow up with me in two weeks for a virtual visit.   Signed, Minus Breeding, MD  06/22/2018 3:16 PM    [facility]

## 2018-06-23 ENCOUNTER — Telehealth (INDEPENDENT_AMBULATORY_CARE_PROVIDER_SITE_OTHER): Payer: Medicare HMO | Admitting: Cardiology

## 2018-06-23 ENCOUNTER — Encounter: Payer: Self-pay | Admitting: Cardiology

## 2018-06-23 VITALS — BP 128/78 | HR 72 | Ht 70.0 in | Wt 218.0 lb

## 2018-06-23 DIAGNOSIS — I480 Paroxysmal atrial fibrillation: Secondary | ICD-10-CM

## 2018-06-23 DIAGNOSIS — I471 Supraventricular tachycardia: Secondary | ICD-10-CM | POA: Diagnosis not present

## 2018-06-23 DIAGNOSIS — R002 Palpitations: Secondary | ICD-10-CM

## 2018-06-23 DIAGNOSIS — I2699 Other pulmonary embolism without acute cor pulmonale: Secondary | ICD-10-CM

## 2018-06-23 MED ORDER — PINDOLOL 5 MG PO TABS: 2.5000 mg | ORAL_TABLET | Freq: Two times a day (BID) | ORAL | 3 refills | Status: DC

## 2018-06-23 NOTE — Patient Instructions (Addendum)
Your physician recommends that you schedule a follow-up appointment in: Cash 2 WEEKS   Your physician has recommended you make the following change in your medication:   STAT PINDOLOL 2.5 MG (1/2 TABLET) DAILY AND IF YOUR HEART RATE DOES NOT IMPROVE TAKE 1/2 TABLET TWICE DAILY   Thank you for choosing [facility]!!

## 2018-07-02 ENCOUNTER — Ambulatory Visit: Payer: Medicare HMO | Admitting: Cardiology

## 2018-07-03 ENCOUNTER — Telehealth: Payer: Self-pay | Admitting: *Deleted

## 2018-07-03 NOTE — Telephone Encounter (Signed)

## 2018-07-08 NOTE — Progress Notes (Signed)
Virtual Visit via Video Note   This visit type was conducted due to national recommendations for restrictions regarding the COVID-19 Pandemic (e.g. social distancing) in an effort to limit this patient's exposure and mitigate transmission in our community.  Due to her co-morbid illnesses, this patient is at least at moderate risk for complications without adequate follow up.  This format is felt to be most appropriate for this patient at this time.  All issues noted in this document were discussed and addressed.  A limited physical exam was performed with this format.  Please refer to the patient's chart for her consent to telehealth for [facility]   Date:  07/08/2018   ID:  Alison Garrett, DOB [birth year], MRN [id number]  Patient Location: Home Provider Location: Home  PCP:  Chipper Herb, MD  Cardiologist:  Minus Breeding, MD  Electrophysiologist:  Constance Haw, MD   Evaluation Performed:  Follow-Up Visit  Chief Complaint:  Palpitations  History of Present Illness:    Alison Garrett is a 73 y.o. female for follow up of atrial fibrillation. She was in the hospital in Sept2019 for chest pain and was found to have a small PE. She was sent home on anticoagulation. She wore a ZIO patch and was found to have SVT for 23 seconds.  She is intolerant of metoprolol.  She has multiple intolerances.  She did see Dr. Curt Bears and was offered an SVT ablation.  She wanted to think about it and talk about it with me.  At this point it still not entirely clear that her symptoms are related to this SVT.    At the last visit I tried to use pindolol for management of her symptoms.  However, she only took a couple of days worth of this and then she states she got profoundly depressed.  She does not want to try any other medications.  However, as of yet she does not want to try ablation.  She wants to wear another monitor because she thinks her symptoms have changed since the fall when she was the  last one.  She says at that time she was on vacation and not having similar symptoms.  Now she gets tachycardia with any physical activity or emotional stress at work.  She feels short of breath with this.  She has discomfort she is doing.  She is not having any presyncope or syncope.  She is not having any weight gain or edema.  She does say that she really has problems with lots of depression and does not want to take any medicines to trigger this further.   The patient does not have symptoms concerning for COVID-19 infection (fever, chills, cough, or new shortness of breath).    Past Medical History:  Diagnosis Date  . Afib ([place])   . Complication of anesthesia    propathal   . Dyslipidemia   . Essential tremor 01/12/2014  . Fibromyalgia    Not currently under active treatment  . GERD (gastroesophageal reflux disease)   . Hiatal hernia   . Hypertension   . Obesity    Past Surgical History:  Procedure Laterality Date  . ABDOMINAL HYSTERECTOMY       No outpatient medications have been marked as taking for the 07/09/18 encounter (Appointment) with Minus Breeding, MD.     Allergies:   Contrast media [iodinated diagnostic agents]; Diltiazem; Metoprolol; Penicillins; Sulfonamide derivatives; Benicar [olmesartan]; Celebrex [celecoxib]; Cymbalta [duloxetine hcl]; Latex; Neurontin [gabapentin]; Norvasc [amlodipine besylate];  Prozac [fluoxetine hcl]; Xanax [alprazolam]; Zoloft [sertraline hcl]; and Zyprexa [olanzapine]   Social History   Tobacco Use  . Smoking status: Never Smoker  . Smokeless tobacco: Never Used  Substance Use Topics  . Alcohol use: No    Alcohol/week: 0.0 standard drinks  . Drug use: No     Family Hx: The patient's family history includes COPD in her father; Cancer in her mother; Coronary artery disease in her mother; Diabetes in her sister; Heart disease in her mother; Hepatitis in her sister.  ROS:   Please see the history of present illness.    As stated in  the HPI and negative for all other systems.   Prior CV studies:   The following studies were reviewed today:  None  Labs/Other Tests and Data Reviewed:    EKG:  No ECG reviewed.  Recent Labs: 09/30/2017: Magnesium 2.1 11/05/2017: B Natriuretic Peptide 29.4 06/19/2018: ALT 30; BUN 17; Creatinine, Ser 1.09; Hemoglobin 16.0; Platelets 249; Potassium 4.6; Sodium 143   Recent Lipid Panel Lab Results  Component Value Date/Time   CHOL 183 06/19/2018 08:03 AM   CHOL 170 06/04/2012 09:25 AM   TRIG 134 06/19/2018 08:03 AM   TRIG CANCELED 08/13/2015 08:16 AM   TRIG 94 06/04/2012 09:25 AM   HDL 39 (L) 06/19/2018 08:03 AM   HDL CANCELED 08/13/2015 08:16 AM   HDL 41 06/04/2012 09:25 AM   CHOLHDL 4.7 (H) 06/19/2018 08:03 AM   LDLCALC 117 (H) 06/19/2018 08:03 AM   LDLCALC 105 (H) 06/18/2013 08:34 AM   LDLCALC 110 (H) 06/04/2012 09:25 AM    Wt Readings from Last 3 Encounters:  06/23/18 218 lb (98.9 kg)  03/19/18 224 lb (101.6 kg)  02/19/18 221 lb (100.2 kg)     Objective:    Vital Signs:  There were no vitals taken for this visit.   VITAL SIGNS:  reviewed GEN:  no acute distress EYES:  sclerae anicteric, EOMI - Extraocular Movements Intact NEURO:  alert and oriented x 3, no obvious focal deficit PSYCH:  normal affect  ASSESSMENT & PLAN:    ATRIAL FIB:    For now she agrees to stay on the Eliquis    Ms.Alison Bruhn Hollandhas a CHA2DS2 - VASc score of 3 with a risk of stroke of 3.2%. Of note her symptoms are not consistent with atrial fib.   She had no A. fib when I am followed her with a monitor previously.  PALPITATIONS:    I don't think this is the SVT but Dr. Curt Bears did offer ablation.    She will consider this at some point.  For now she requests another monitor which is not unreasonable to see if there is been any change in her rhythm since the fall.   HTN:   The blood pressure is at target.  No change in therapy.   PULMONARY EMBOLISM: She remains on anticoagulation  because of the atrial fibrillation.   COVID-19 Education: The signs and symptoms of COVID-19 were discussed with the patient and how to seek care for testing (follow up with PCP or arrange E-visit).  The importance of social distancing was discussed today.  Time:   Today, I have spent 16 minutes with the patient with telehealth technology discussing the above problems.     Medication Adjustments/Labs and Tests Ordered: Current medicines are reviewed at length with the patient today.  Concerns regarding medicines are outlined above.   Tests Ordered: No orders of the defined types were placed in  this encounter.   Medication Changes: No orders of the defined types were placed in this encounter.   Disposition:  Follow up with me after an evnet recorder  Signed, Minus Breeding, MD  07/08/2018 8:51 PM    [facility]

## 2018-07-09 ENCOUNTER — Telehealth: Payer: Self-pay | Admitting: Radiology

## 2018-07-09 ENCOUNTER — Telehealth (INDEPENDENT_AMBULATORY_CARE_PROVIDER_SITE_OTHER): Payer: Medicare HMO | Admitting: Cardiology

## 2018-07-09 ENCOUNTER — Encounter: Payer: Self-pay | Admitting: Cardiology

## 2018-07-09 VITALS — BP 130/90 | HR 82 | Ht 70.0 in | Wt 218.0 lb

## 2018-07-09 DIAGNOSIS — R002 Palpitations: Secondary | ICD-10-CM

## 2018-07-09 DIAGNOSIS — I10 Essential (primary) hypertension: Secondary | ICD-10-CM | POA: Diagnosis not present

## 2018-07-09 DIAGNOSIS — I480 Paroxysmal atrial fibrillation: Secondary | ICD-10-CM

## 2018-07-09 DIAGNOSIS — Z86711 Personal history of pulmonary embolism: Secondary | ICD-10-CM

## 2018-07-09 DIAGNOSIS — Z7901 Long term (current) use of anticoagulants: Secondary | ICD-10-CM | POA: Diagnosis not present

## 2018-07-09 NOTE — Patient Instructions (Signed)
Medication Instructions:  The current medical regimen is effective;  continue present plan and medications.  If you need a refill on your cardiac medications before your next appointment, please call your pharmacy.   Testing/Procedures: Your physician has recommended that you wear a holter monitor for 14 days. Holter monitors are medical devices that record the heart's electrical activity. Doctors most often use these monitors to diagnose arrhythmias. Arrhythmias are problems with the speed or rhythm of the heartbeat. The monitor is a small, portable device. You can wear one while you do your normal daily activities. This is usually used to diagnose what is causing palpitations/syncope (passing out). This will be mailed to your home address with instruction on how to use it.  Follow-Up: Follow up with Dr Percival Spanish after your 14 day holter monitor.  This will be by virtual visit.  Thank you for choosing [facility]!!

## 2018-07-09 NOTE — Telephone Encounter (Signed)
Enrolled patient for a 14 Day Zio long term monitor to be mailed. Patient knows to expect the monitor to arrive in 3-4 days

## 2018-07-10 ENCOUNTER — Ambulatory Visit: Payer: Medicare HMO | Admitting: Cardiology

## 2018-07-10 NOTE — Progress Notes (Signed)
Electrophysiology TeleHealth Note   Due to national recommendations of social distancing due to COVID 19, an audio/video telehealth visit is felt to be most appropriate for this patient at this time.  See Epic message for the patient's consent to telehealth for [facility].   Date:  07/11/2018   ID:  Alison Garrett, DOB 08-17-1945, MRN [id number]  Location: patient's home  Provider location: [address], [place]  Evaluation Performed: Follow-up visit  PCP:  Chipper Herb, MD  Cardiologist:  Minus Breeding, MD  Electrophysiologist:  Dr Curt Bears  Chief Complaint:  SVT  History of Present Illness:    Alison Garrett is a 73 y.o. female who presents via audio/video conferencing for a telehealth visit today.  Since last being seen in our clinic, the patient reports doing very well.  Today, she denies symptoms of palpitations, chest pain, shortness of breath,  lower extremity edema, dizziness, presyncope, or syncope.  The patient is otherwise without complaint today.  The patient denies symptoms of fevers, chills, cough, or new SOB worrisome for COVID 19.  Hisotry of HTN, HLD, PE. Had episode of SVT noted on ZIO patch. Did not tolerate metoprolol.  Today, denies symptoms of chest pain, shortness of breath, orthopnea, PND, lower extremity edema, claudication, dizziness, presyncope, syncope, bleeding, or neurologic sequela. The patient is tolerating medications without difficulties.  Unfortunately, she has continued to have episodes of palpitations.  She says that they happen most of the time when she is exerting herself.  She can sit down and her heart rate goes back to normal.  She says that her heart rate goes approximately 100 to 110 bpm when she is having palpitations.  Otherwise she is doing well.  She has no chest pain or shortness of breath.  She continues to work at the [employer].  Past Medical History:  Diagnosis Date  . Afib ([place])   . Complication of  anesthesia    propathal   . Dyslipidemia   . Essential tremor 01/12/2014  . Fibromyalgia    Not currently under active treatment  . GERD (gastroesophageal reflux disease)   . Hiatal hernia   . Hypertension   . Obesity     Past Surgical History:  Procedure Laterality Date  . ABDOMINAL HYSTERECTOMY      Current Outpatient Medications  Medication Sig Dispense Refill  . apixaban (ELIQUIS) 5 MG TABS tablet Take 1 tablet (5 mg total) by mouth 2 (two) times daily. Begin after starter pack is completed. 180 tablet 3  . hydrochlorothiazide (HYDRODIURIL) 25 MG tablet Take 1/2 (one-half) tablet by mouth once daily 45 tablet 0   No current facility-administered medications for this visit.     Allergies:   Contrast media [iodinated diagnostic agents]; Diltiazem; Metoprolol; Penicillins; Sulfonamide derivatives; Benicar [olmesartan]; Celebrex [celecoxib]; Cymbalta [duloxetine hcl]; Latex; Neurontin [gabapentin]; Norvasc [amlodipine besylate]; Prozac [fluoxetine hcl]; Xanax [alprazolam]; Zoloft [sertraline hcl]; and Zyprexa [olanzapine]   Social History:  The patient  reports that she has never smoked. She has never used smokeless tobacco. She reports that she does not drink alcohol or use drugs.   Family History:  The patient's  family history includes COPD in her father; Cancer in her mother; Coronary artery disease in her mother; Diabetes in her sister; Heart disease in her mother; Hepatitis in her sister.   ROS:  Please see the history of present illness.   All other systems are personally reviewed and negative.    Exam:  Vital Signs:  BP 128/78   Pulse 70   Well appearing, alert and conversant, regular work of breathing,  good skin color Eyes- anicteric, neuro- grossly intact, skin- no apparent rash or lesions or cyanosis, mouth- oral mucosa is pink   Labs/Other Tests and Data Reviewed:    Recent Labs: 09/30/2017: Magnesium 2.1 11/05/2017: B Natriuretic Peptide 29.4 06/19/2018: ALT  30; BUN 17; Creatinine, Ser 1.09; Hemoglobin 16.0; Platelets 249; Potassium 4.6; Sodium 143   Wt Readings from Last 3 Encounters:  07/09/18 218 lb (98.9 kg)  06/23/18 218 lb (98.9 kg)  03/19/18 224 lb (101.6 kg)     Other studies personally reviewed: Additional studies/ records that were reviewed today include: ECG 02/19/18 personally reviewed Review of the above records today demonstrates:  Sinus rhythm  ASSESSMENT & PLAN:    1.  Paroxysmal atrial fibrillation: on eliquis.  At this point, she does not feel that her palpitations are due to atrial fibrillation.  We Yaritzel Stange continue with current management.  This patients CHA2DS2-VASc Score and unadjusted Ischemic Stroke Rate (% per year) is equal to 3.2 % stroke rate/year from a score of 3  Above score calculated as 1 point each if present [CHF, HTN, DM, Vascular=MI/PAD/Aortic Plaque, Age if 65-74, or Female] Above score calculated as 2 points each if present [Age > 75, or Stroke/TIA/TE]  2. SVT: It is unclear to me as to the cause of her palpitations.  She is planning on wearing a monitor by Dr. Percival Spanish.  We Diella Gillingham get the results of that monitor after it is completed to see if there is any further intervention needed.  3. Hypertension: Currently well controlled  4. PE: on eliquis   COVID 19 screen The patient denies symptoms of COVID 19 at this time.  The importance of social distancing was discussed today.  Follow-up: 3 months  Current medicines are reviewed at length with the patient today.   The patient does not have concerns regarding her medicines.  The following changes were made today:  none  Labs/ tests ordered today include:  No orders of the defined types were placed in this encounter.    Patient Risk:  after full review of this patients clinical status, I feel that they are at moderate risk at this time.  Today, I have spent 9 minutes with the patient with telehealth technology discussing palpitations.    Signed,  Oneita Allmon Meredith Leeds, MD  07/11/2018 10:08 AM     [facility] [address] [place] [address] [phone] (office) [phone] (fax)

## 2018-07-11 ENCOUNTER — Telehealth (INDEPENDENT_AMBULATORY_CARE_PROVIDER_SITE_OTHER): Payer: Medicare HMO | Admitting: Cardiology

## 2018-07-11 ENCOUNTER — Other Ambulatory Visit: Payer: Self-pay

## 2018-07-11 DIAGNOSIS — R002 Palpitations: Secondary | ICD-10-CM

## 2018-07-12 ENCOUNTER — Ambulatory Visit (INDEPENDENT_AMBULATORY_CARE_PROVIDER_SITE_OTHER): Payer: Medicare HMO

## 2018-07-12 DIAGNOSIS — I480 Paroxysmal atrial fibrillation: Secondary | ICD-10-CM

## 2018-07-12 DIAGNOSIS — R002 Palpitations: Secondary | ICD-10-CM | POA: Diagnosis not present

## 2018-08-02 DIAGNOSIS — R002 Palpitations: Secondary | ICD-10-CM | POA: Diagnosis not present

## 2018-08-05 ENCOUNTER — Other Ambulatory Visit: Payer: Self-pay

## 2018-08-06 ENCOUNTER — Other Ambulatory Visit: Payer: Self-pay | Admitting: Family Medicine

## 2018-08-11 ENCOUNTER — Telehealth: Payer: Self-pay | Admitting: *Deleted

## 2018-08-11 NOTE — Telephone Encounter (Signed)
    COVID-19 Pre-Screening Questions:  . In the past 7 to 10 days have you had a cough,  shortness of breath, headache, congestion, fever (100 or greater) body aches, chills, sore throat, or sudden loss of taste or sense of smell? . Have you been around anyone with known Covid 19. . Have you been around anyone who is awaiting Covid 19 test results in the past 7 to 10 days? . Have you been around anyone who has been exposed to Covid 19, or has mentioned symptoms of Covid 19 within the past 7 to 10 days?  If you have any concerns/questions about symptoms patients report during screening (either on the phone or at threshold). Contact the provider seeing the patient or DOD for further guidance.  If neither are available contact a member of the leadership team.       Patient answered no to above questions. She knows to wear a mask, come only 15 minutes early and alone. Also, she will call if her health changes before appt. CP/cma

## 2018-08-12 NOTE — Progress Notes (Signed)
HPI  Alison Garrett is a 73 y.o. female for follow up ofatrial fibrillation. She was in the hospital in [id number] chest pain and was found to have a small PE. She was sent home on anticoagulation. She wore a ZIO patch and was found to have SVT for 23 seconds. She is intolerant of metoprolol. She has multiple intolerances. She did see Dr. Alyson Reedy was offered an SVT ablation. She wanted to think about it and talk about it with me. At this point it still not entirely clear that her symptoms are related to this SVT.    I tried to use pindolol for management of her symptoms.  However, she only took a couple of days worth of this and then she states she got profoundly depressed.  She had a monitor recently and had SVT.  However, I have been unable to correlate this with her symptoms.  She has been offered ablation by Dr. Curt Bears.  She returns to discuss this.    She continues to have palpitations as described previously.  I went over her monitor results and she had about 588 episodes of SVT.  The mechanism is not entirely clear from all of these.  The longest was about 35 seconds.  Most episodes were shorter lasting only a few seconds.  She has been intolerant now diltiazem, pindolol and metoprolol.  She does not want to at this point try another medication.  She denies any presyncope or syncope though she feels her heart racing.   Allergies  Allergen Reactions  . Contrast Media [Iodinated Diagnostic Agents] Anaphylaxis  . Penicillins Anaphylaxis  . Sulfonamide Derivatives Hives  . Benicar [Olmesartan] Rash  . Celebrex [Celecoxib] Anxiety  . Cymbalta [Duloxetine Hcl] Itching  . Latex Rash  . Neurontin [Gabapentin] Nausea Only  . Norvasc [Amlodipine Besylate] Swelling  . Prozac [Fluoxetine Hcl] Other (See Comments)  . Xanax [Alprazolam] Other (See Comments)  . Zoloft [Sertraline Hcl] Itching  . Zyprexa [Olanzapine] Other (See Comments)    Wt gain    Current Outpatient  Medications  Medication Sig Dispense Refill  . apixaban (ELIQUIS) 5 MG TABS tablet Take 1 tablet (5 mg total) by mouth 2 (two) times daily. Begin after starter pack is completed. 180 tablet 3  . hydrochlorothiazide (HYDRODIURIL) 25 MG tablet Take 1/2 (one-half) tablet by mouth once daily 45 tablet 1   No current facility-administered medications for this visit.     Past Medical History:  Diagnosis Date  . Afib ([place])   . Complication of anesthesia    propathal   . Dyslipidemia   . Essential tremor 01/12/2014  . Fibromyalgia    Not currently under active treatment  . GERD (gastroesophageal reflux disease)   . Hiatal hernia   . Hypertension   . Obesity     Past Surgical History:  Procedure Laterality Date  . ABDOMINAL HYSTERECTOMY       ROS: As stated in the HPI and negative for all other systems.   PHYSICAL EXAM BP (!) 150/100   Pulse 69   Ht 5\' 10"  (1.778 m)   Wt 213 lb (96.6 kg)   BMI 30.56 kg/m   GENERAL:  Well appearing NECK:  No jugular venous distention, waveform within normal limits, carotid upstroke brisk and symmetric, no bruits, no thyromegaly LUNGS:  Clear to auscultation bilaterally CHEST:  Unremarkable HEART:  PMI not displaced or sustained,S1 and S2 within normal limits, no S3, no S4, no clicks, no rubs, no murmurs ABD:  Flat, positive bowel sounds normal in frequency in pitch, no bruits, no rebound, no guarding, no midline pulsatile mass, no hepatomegaly, no splenomegaly EXT:  2 plus pulses throughout, no edema, no cyanosis no clubbing    Lab Results  Component Value Date   TSH 1.56 10/07/2015   EKG:   Sinus rhythm, rate 69, axis within normal limits, intervals within normal limits, no acute ST-T wave changes.   ASSESSMENT AND PLAN   ATRIAL FIB:    [name] has a CHA2DS2 - VASc score of 3 with a risk of stroke of 3.2%.   She agrees to continue current therapy.   PALPITATIONS: She has an SVT which appears to be an atrial tachycardia.    She has been intolerant of medications.  She agrees to have a conversation again with Dr. Curt Bears with questions to be asked around the possibility of an ablation.  I cannot absolutely prove from this latest monitor but all of her symptoms are related to an ablative or rhythm but there is likely some component of them that are and she would probably opt for this if she and Dr. Curt Bears agree.  No change in therapy.   HTN:  At target.  No change in therapy.   PULMONARY EMBOLISM:     She agrees with anticoagulation for an unprovoked embolism.

## 2018-08-13 ENCOUNTER — Ambulatory Visit (INDEPENDENT_AMBULATORY_CARE_PROVIDER_SITE_OTHER): Payer: Medicare HMO | Admitting: Cardiology

## 2018-08-13 ENCOUNTER — Other Ambulatory Visit: Payer: Self-pay

## 2018-08-13 ENCOUNTER — Encounter: Payer: Self-pay | Admitting: Cardiology

## 2018-08-13 VITALS — BP 150/100 | HR 69 | Ht 70.0 in | Wt 213.0 lb

## 2018-08-13 DIAGNOSIS — R002 Palpitations: Secondary | ICD-10-CM | POA: Diagnosis not present

## 2018-08-13 DIAGNOSIS — I480 Paroxysmal atrial fibrillation: Secondary | ICD-10-CM | POA: Diagnosis not present

## 2018-08-13 DIAGNOSIS — I10 Essential (primary) hypertension: Secondary | ICD-10-CM

## 2018-08-13 NOTE — Patient Instructions (Signed)
Medication Instructions:  The current medical regimen is effective;  continue present plan and medications.  If you need a refill on your cardiac medications before your next appointment, please call your pharmacy.   Follow-Up: Follow up in 6 months with Dr. Hochrein.  You will receive a letter in the mail 2 months before you are due.  Please call us when you receive this letter to schedule your follow up appointment.  Thank you for choosing [facility]!!      

## 2018-08-27 ENCOUNTER — Telehealth: Payer: Self-pay | Admitting: Family Medicine

## 2018-08-27 NOTE — Chronic Care Management (AMB) (Signed)
Chronic Care Management   Note  08/27/2018 Name: Alison Garrett MRN: [id number] DOB: [birth year]  Alison Garrett is a 73 y.o. year old female who is a primary care patient of Chipper Herb, MD. I reached out to Clydell Hakim by phone today in response to a referral sent by Alison Garrett [facility] health plan.    Alison Garrett was given information about Chronic Care Management services today including:  1. CCM service includes personalized support from designated clinical staff supervised by her physician, including individualized plan of care and coordination with other care providers 2. 24/7 contact phone numbers for assistance for urgent and routine care needs. 3. Service will only be billed when office clinical staff spend 20 minutes or more in a month to coordinate care. 4. Only one practitioner may furnish and bill the service in a calendar month. 5. The patient may stop CCM services at any time (effective at the end of the month) by phone call to the office staff. 6. The patient will be responsible for cost sharing (co-pay) of up to 20% of the service fee (after annual deductible is met).  Patient agreed to services and verbal consent obtained.   Follow up plan: Telephone appointment with CCM team member scheduled for: 08/02/2018  [place]  ??[email or web address]   ??[phone]

## 2018-09-01 ENCOUNTER — Ambulatory Visit (INDEPENDENT_AMBULATORY_CARE_PROVIDER_SITE_OTHER): Payer: Medicare HMO | Admitting: *Deleted

## 2018-09-01 DIAGNOSIS — I480 Paroxysmal atrial fibrillation: Secondary | ICD-10-CM | POA: Diagnosis not present

## 2018-09-01 DIAGNOSIS — I10 Essential (primary) hypertension: Secondary | ICD-10-CM

## 2018-09-01 DIAGNOSIS — I471 Supraventricular tachycardia: Secondary | ICD-10-CM

## 2018-09-01 DIAGNOSIS — Z86711 Personal history of pulmonary embolism: Secondary | ICD-10-CM

## 2018-09-01 NOTE — Chronic Care Management (AMB) (Signed)
Chronic Care Management   Initial Visit Note  09/01/2018 Name: Alison Garrett MRN: [id number] DOB: [birth year]  Referred by: Dettinger, Fransisca Kaufmann, MD Reason for referral : No chief complaint on file.   Alison Garrett is a 73 y.o. year old female who is a primary care patient of Dettinger, Fransisca Kaufmann, MD. The CCM team was consulted for assistance with chronic disease management and care coordination needs.   Review of patient status, including review of consultants reports, relevant laboratory and other test results, and collaboration with appropriate care team members and the patient's provider was performed as part of comprehensive patient evaluation and provision of chronic care management services.    I spoke with Alison Garrett by telephone today. She is minimally physically active but is reluctant to increase activity level due to heat and irregular heartbeat. She is a female patient of Dr Building control surveyor who recently transferred to his care since her PCP, Alison Garrett, retired. Alison Garrett lives alone and has 3 adult children. She works at the [employer].   SDOH (Social Determinants of Health) screening performed today. See Care Plan Entry related to challenges with: Social Connections Physical Activity   Objective:  BP Readings from Last 3 Encounters:  08/13/18 (!) 150/100  07/11/18 128/78  07/09/18 130/90   BMI Readings from Last 3 Encounters:  08/13/18 30.56 kg/m  07/09/18 31.28 kg/m  06/23/18 31.28 kg/m   Allergies as of 09/01/2018      Reactions   Contrast Media [iodinated Diagnostic Agents] Anaphylaxis   Diltiazem Other (See Comments)   Pt. Had pain, jitters and felt terrible   Metoprolol Nausea And Vomiting   Penicillins Anaphylaxis   Has patient had a PCN reaction causing immediate rash, facial/tongue/throat swelling, SOB or lightheadedness with hypotension: Yesyes Has patient had a PCN reaction causing severe rash involving mucus membranes or skin necrosis:  Yesyes Has patient had a PCN reaction that required hospitalization unknown Has patient had a PCN reaction occurring within the last 10 years: Nono If all of the above answers are "NO", then may proceed with Cephalo   Sulfonamide Derivatives Hives   Benicar [olmesartan] Rash   Celebrex [celecoxib] Anxiety   Cymbalta [duloxetine Hcl] Itching   Latex Rash   Neurontin [gabapentin] Nausea Only   Norvasc [amlodipine Besylate] Swelling   Prozac [fluoxetine Hcl] Other (See Comments)   Xanax [alprazolam] Other (See Comments)   Zoloft [sertraline Hcl] Itching   Zyprexa [olanzapine] Other (See Comments)   Wt gain      Medication List       Accurate as of September 01, 2018  5:04 PM. If you have any questions, ask your nurse or doctor.        apixaban 5 MG Tabs tablet Commonly known as: ELIQUIS Take 1 tablet (5 mg total) by mouth 2 (two) times daily. Begin after starter pack is completed.   hydrochlorothiazide 25 MG tablet Commonly known as: HYDRODIURIL Take 1/2 (one-half) tablet by mouth once daily        Assessment & Plan: Goals Addressed            This Visit's Progress     Patient Stated   . "I want to keep my blood pressure under control" (pt-stated)       Current Barriers:  . Chronic Disease Management support and education needs related to hypertension  Nurse Case Manager Clinical Goal(s):  Marland Kitchen Over the next 30 days, patient will verbalize understanding of plan for hypertension management .  Over the next 60 days, patient will attend all scheduled medical appointments: Dr Warrick Parisian 9/23, Cardio 8/28  Interventions:  . Evaluation of current treatment plan related to hypertension and patient's adherence to plan as established by provider. . Reviewed medications with patient and discussed HCTZ and intolerance to other antihypertensives . Discussed plans with patient for ongoing care management follow up and provided patient with direct contact information for care management  team . Advised patient, providing education and rationale, to monitor blood pressure daily and record, calling [phone] for findings outside established parameters.   Patient Self Care Activities:  . Performs ADL's independently . Performs IADL's independently  Initial goal documentation     . "I would like for SVT to resolve" (pt-stated)       Current Barriers:  . Chronic Disease Management support and education needs related to SVT management and treatment  Nurse Case Manager Clinical Goal(s):  Marland Kitchen Over the next 30 days, patient will work with Consulting civil engineer to address needs related to SVT management . Over the next 60 days, patient will attend all scheduled medical appointments: Dr Curt Bears 10/10/18 to discuss cardiac electrophysiology and possible ablation for treatment of SVT. Dr Dettinger 11/05/18 to establish care with new PCP  Interventions:  . Advised patient to keep appointments with Dr Curt Bears and Dr Dettinger . Provided education to patient re: SVT self management with valsalva maneuver  . Provided education to patient re: when to call 911, cardiology, or RNCM . Reviewed medications with patient and discussed her trial of beta blockers and their side effects  Patient Self Care Activities:  . Performs ADL's independently . Performs IADL's independently  Initial goal documentation          The care management team will reach out to the patient again over the next 30 days.   Chong Sicilian BSN, RN-BC Embedded Chronic Care Manager [facility] / [place] [facility] Direct Dial: [phone]

## 2018-09-01 NOTE — Patient Instructions (Signed)
Visit Information  Goals Addressed            This Visit's Progress     Patient Stated   . "I need to exercise more" (pt-stated)       Current Barriers:  . Chronic Disease Management support and education needs related to weight management and physical fitness . Excessive heat  Nurse Case Manager Clinical Goal(s):  Marland Kitchen Over the next 30 days, patient will verbalize understanding of plan for increased physical activity . Over the next 30 days, patient will work with Consulting civil engineer to address needs related to weight management and physical activity level.   Interventions:  . Discussed current physical activity level  o None other than working around the house and any walking/movement done at work o States that it is too hot to consider increasing physical activity level right now. She would like to implement something in the fall, winter, and spring . Discussed potential problem with SVT/Afib and exercise  Patient Self Care Activities:  . Performs ADL's independently . Performs IADL's independently  Initial goal documentation     . "I want to keep my blood pressure under control" (pt-stated)       Current Barriers:  . Chronic Disease Management support and education needs related to hypertension  Nurse Case Manager Clinical Goal(s):  Marland Kitchen Over the next 30 days, patient will verbalize understanding of plan for hypertension management . Over the next 60 days, patient will attend all scheduled medical appointments: Dr Warrick Parisian 9/23, Cardio 8/28  Interventions:  . Evaluation of current treatment plan related to hypertension and patient's adherence to plan as established by provider. . Reviewed medications with patient and discussed HCTZ and intolerance to other antihypertensives . Discussed plans with patient for ongoing care management follow up and provided patient with direct contact information for care management team . Advised patient, providing education and rationale, to  monitor blood pressure daily and record, calling [phone] for findings outside established parameters.   Patient Self Care Activities:  . Performs ADL's independently . Performs IADL's independently  Initial goal documentation     . "I would like for SVT to resolve" (pt-stated)       Current Barriers:  . Chronic Disease Management support and education needs related to SVT management and treatment  Nurse Case Manager Clinical Goal(s):  Marland Kitchen Over the next 30 days, patient will work with Consulting civil engineer to address needs related to SVT management . Over the next 60 days, patient will attend all scheduled medical appointments: Dr Curt Bears 10/10/18 to discuss cardiac electrophysiology and possible ablation for treatment of SVT. Dr Dettinger 11/05/18 to establish care with new PCP  Interventions:  . Advised patient to keep appointments with Dr Curt Bears and Dr Dettinger . Provided education to patient re: SVT self management with valsalva maneuver  . Provided education to patient re: when to call 911, cardiology, or RNCM . Reviewed medications with patient and discussed her trial of beta blockers and their side effects  Patient Self Care Activities:  . Performs ADL's independently . Performs IADL's independently  Initial goal documentation       Other   . Concern for social isolation, especially due to [place] Guidelines       Current Barriers:  . None identified  Nurse Case Manager Clinical Goal(s):  Marland Kitchen Over the next 30 days, patient will continue to work ([place]) and maintain current level of socialization within recommended guidelines . Over the next  60 days, patient will talk with RN Care Manager, LCSW, PCP, or another professional regarding any negative effects that social distancing is having on her mental and physical health.  . Over the next 60 days, patient will continue to engage in healthy alternatives to in-person  socializing  Interventions:  . Encouraged patient to continue reading as a pastime . Advised to seek help if needed . Encouraged to continue to work as safely as possible  Patient Self Care Activities:  . Performs ADL's independently . Performs IADL's independently  Initial goal documentation         The patient verbalized understanding of instructions provided today and declined a print copy of patient instruction materials.   The care management team will reach out to the patient again over the next 30 days.    Chong Sicilian BSN, RN-BC Embedded Chronic Care Manager [facility] [place] [facility] Direct Dial: [phone]

## 2018-09-08 ENCOUNTER — Other Ambulatory Visit: Payer: Self-pay

## 2018-09-11 ENCOUNTER — Ambulatory Visit (INDEPENDENT_AMBULATORY_CARE_PROVIDER_SITE_OTHER): Payer: Medicare HMO | Admitting: Family Medicine

## 2018-09-11 ENCOUNTER — Other Ambulatory Visit: Payer: Self-pay

## 2018-09-11 ENCOUNTER — Encounter: Payer: Self-pay | Admitting: Family Medicine

## 2018-09-11 VITALS — BP 144/98 | HR 105 | Temp 95.9°F | Ht 70.0 in | Wt 214.6 lb

## 2018-09-11 DIAGNOSIS — B351 Tinea unguium: Secondary | ICD-10-CM | POA: Diagnosis not present

## 2018-09-11 NOTE — Progress Notes (Signed)
BP (!) 144/98   Pulse (!) 105   Temp (!) 95.9 F (35.5 C) (Temporal)   Ht 5\' 10"  (1.778 m)   Wt 214 lb 9.6 oz (97.3 kg)   BMI 30.79 kg/m    Subjective:   Patient ID: Alison Garrett, female    DOB: 11-Mar-1945, 73 y.o.   MRN: [id number]  HPI: Alison Garrett is a 73 y.o. female presenting on 09/11/2018 for Ingrown Toenail (left big toe- x 1 week)   HPI Left big toe splitting Patient has a split left big toenail from where she is been soaking it and trying to remove the excess because it is thick and yellowed and infected with fungus and on the lateral aspect she has removed most of it except for the inner most part that is near the base and she is coming in today to see if we can remove that for her.  She denies any pain or irritation with it but just that she is trying to get her toenails cleaned up and fungus free.  She denies any fevers or chills.  Relevant past medical, surgical, family and social history reviewed and updated as indicated. Interim medical history since our last visit reviewed. Allergies and medications reviewed and updated.  Review of Systems  Constitutional: Negative for chills and fever.  Eyes: Negative for visual disturbance.  Respiratory: Negative for chest tightness and shortness of breath.   Cardiovascular: Negative for chest pain and leg swelling.  Skin: Positive for color change (Thickened and yellow toenail on left great toe with cracking and splitting). Negative for rash.  Neurological: Negative for light-headedness and headaches.  Psychiatric/Behavioral: Negative for agitation and behavioral problems.  All other systems reviewed and are negative.   Per HPI unless specifically indicated above      Objective:   BP (!) 144/98   Pulse (!) 105   Temp (!) 95.9 F (35.5 C) (Temporal)   Ht 5\' 10"  (1.778 m)   Wt 214 lb 9.6 oz (97.3 kg)   BMI 30.79 kg/m   Wt Readings from Last 3 Encounters:  09/11/18 214 lb 9.6 oz (97.3 kg)  08/13/18 213 lb (96.6  kg)  07/09/18 218 lb (98.9 kg)    Physical Exam Vitals signs and nursing note reviewed.  Constitutional:      General: She is not in acute distress.    Appearance: She is well-developed. She is not diaphoretic.  Eyes:     Conjunctiva/sclera: Conjunctivae normal.  Skin:    Findings: No rash.  Neurological:     Mental Status: She is alert and oriented to person, place, and time.     Coordination: Coordination normal.  Psychiatric:        Behavior: Behavior normal.     Assessment & Plan:   Problem List Items Addressed This Visit    None    Visit Diagnoses    Onychomycosis    -  Primary      Patient is coming in with a split toenail and a little fragment that is on the inner side of her toe on the lateral side that she has not been able to pull out on her own.  It is her left great toenail that is causing her the problem.  Used curved long forceps to grasp the nail and able to remove to where she has a clean nail base for to grow back. Follow up plan: Return if symptoms worsen or fail to improve.  Counseling provided for  all of the vaccine components No orders of the defined types were placed in this encounter.   Caryl Pina, MD [facility] 09/11/2018, 2:39 PM

## 2018-10-10 ENCOUNTER — Ambulatory Visit: Payer: Medicare HMO | Admitting: Cardiology

## 2018-11-04 ENCOUNTER — Other Ambulatory Visit: Payer: Self-pay

## 2018-11-05 ENCOUNTER — Ambulatory Visit (INDEPENDENT_AMBULATORY_CARE_PROVIDER_SITE_OTHER): Payer: Medicare HMO | Admitting: Family Medicine

## 2018-11-05 ENCOUNTER — Encounter: Payer: Self-pay | Admitting: Family Medicine

## 2018-11-05 VITALS — BP 138/91 | HR 76 | Temp 95.7°F | Resp 20 | Ht 70.0 in | Wt 213.4 lb

## 2018-11-05 DIAGNOSIS — I10 Essential (primary) hypertension: Secondary | ICD-10-CM | POA: Diagnosis not present

## 2018-11-05 DIAGNOSIS — Z1159 Encounter for screening for other viral diseases: Secondary | ICD-10-CM

## 2018-11-05 DIAGNOSIS — E66811 Obesity, class 1: Secondary | ICD-10-CM

## 2018-11-05 DIAGNOSIS — I480 Paroxysmal atrial fibrillation: Secondary | ICD-10-CM

## 2018-11-05 DIAGNOSIS — E7800 Pure hypercholesterolemia, unspecified: Secondary | ICD-10-CM | POA: Diagnosis not present

## 2018-11-05 DIAGNOSIS — R7309 Other abnormal glucose: Secondary | ICD-10-CM

## 2018-11-05 DIAGNOSIS — E669 Obesity, unspecified: Secondary | ICD-10-CM

## 2018-11-05 LAB — BAYER DCA HB A1C WAIVED: HB A1C (BAYER DCA - WAIVED): 5.7 % (ref <7.0)

## 2018-11-05 MED ORDER — HYDROCHLOROTHIAZIDE 25 MG PO TABS: ORAL_TABLET | ORAL | 1 refills | Status: DC

## 2018-11-05 NOTE — Progress Notes (Signed)
BP (!) 138/91   Pulse 76   Temp (!) 95.7 F (35.4 C) (Temporal)   Resp 20   Ht 5' 10"  (1.778 m)   Wt 213 lb 6.4 oz (96.8 kg)   SpO2 97%   BMI 30.62 kg/m    Subjective:   Patient ID: Alison Garrett, female    DOB: 08-21-1945, 73 y.o.   MRN: [id number]  HPI: Alison Garrett is a 73 y.o. female presenting on 11/05/2018 for Establish Care ([place])   HPI Hypertension Patient is currently on hctz, and their blood pressure today is 138/91. Patient denies any lightheadedness or dizziness. Patient denies headaches, blurred vision, chest pains, shortness of breath, or weakness. Denies any side effects from medication and is content with current medication.   Chads2vasc:5: 7.2% in the next year and A. Fib Patient sees cardiology anticoagulation for this.  She thinks that react methylation not we discussed the risks and benefits and she will have further discussion with her cardiologist about this.  Relevant past medical, surgical, family and social history reviewed and updated as indicated. Interim medical history since our last visit reviewed. Allergies and medications reviewed and updated.  Review of Systems  Constitutional: Negative for chills and fever.  Eyes: Negative for visual disturbance.  Respiratory: Negative for chest tightness and shortness of breath.   Cardiovascular: Negative for chest pain and leg swelling.  Musculoskeletal: Negative for back pain and gait problem.  Skin: Negative for rash.  Neurological: Negative for dizziness, light-headedness and headaches.  Psychiatric/Behavioral: Negative for agitation and behavioral problems.  All other systems reviewed and are negative.   Per HPI unless specifically indicated above      Objective:   BP (!) 138/91   Pulse 76   Temp (!) 95.7 F (35.4 C) (Temporal)   Resp 20   Ht 5' 10"  (1.778 m)   Wt 213 lb 6.4 oz (96.8 kg)   SpO2 97%   BMI 30.62 kg/m   Wt Readings from Last 3 Encounters:  11/05/18 213 lb 6.4 oz (96.8  kg)  09/11/18 214 lb 9.6 oz (97.3 kg)  08/13/18 213 lb (96.6 kg)    Physical Exam Vitals signs and nursing note reviewed.  Constitutional:      General: She is not in acute distress.    Appearance: She is well-developed. She is not diaphoretic.  Eyes:     Conjunctiva/sclera: Conjunctivae normal.  Cardiovascular:     Rate and Rhythm: Normal rate and regular rhythm.     Heart sounds: Normal heart sounds. No murmur.  Pulmonary:     Effort: Pulmonary effort is normal. No respiratory distress.     Breath sounds: Normal breath sounds. No wheezing.  Musculoskeletal: Normal range of motion.        General: No tenderness.  Skin:    General: Skin is warm and dry.     Findings: No rash.  Neurological:     Mental Status: She is alert and oriented to person, place, and time.     Coordination: Coordination normal.  Psychiatric:        Behavior: Behavior normal.       Assessment & Plan:   Problem List Items Addressed This Visit      Cardiovascular and Mediastinum   HYPERTENSION, BENIGN - Primary   Relevant Medications   hydrochlorothiazide (HYDRODIURIL) 25 MG tablet   Other Relevant Orders   CMP14+EGFR   Paroxysmal A-fib (HCC)   Relevant Medications   hydrochlorothiazide (HYDRODIURIL) 25 MG tablet  Other Relevant Orders   CBC with Differential/Platelet     Other   Obesity (BMI 30.0-34.9)   Hyperlipemia   Relevant Medications   hydrochlorothiazide (HYDRODIURIL) 25 MG tablet   Other Relevant Orders   Lipid panel    Other Visit Diagnoses    Elevated glucose       Relevant Orders   Bayer DCA Hb A1c Waived   Need for hepatitis C screening test       Relevant Orders   Hepatitis C antibody      Hydrochlorothiazide will be continued, her blood pressures been running good at home and is only slightly elevated here.  Discussed blood thinner for A. fib, she is going to see cardiology will discuss this more with them, they also talked about ablation. Follow up plan: Return  in about 6 months (around 05/05/2019), or if symptoms worsen or fail to improve, for htn.  Counseling provided for all of the vaccine components No orders of the defined types were placed in this encounter.   Caryl Pina, MD [facility] 11/05/2018, 8:41 AM

## 2018-11-06 LAB — CBC WITH DIFFERENTIAL/PLATELET
Basophils Absolute: 0.1 10*3/uL (ref 0.0–0.2)
Basos: 1 %
EOS (ABSOLUTE): 0.1 10*3/uL (ref 0.0–0.4)
Eos: 1 %
Hematocrit: 45.9 % (ref 34.0–46.6)
Hemoglobin: 16 g/dL — ABNORMAL HIGH (ref 11.1–15.9)
Immature Grans (Abs): 0 10*3/uL (ref 0.0–0.1)
Immature Granulocytes: 0 %
Lymphocytes Absolute: 1.6 10*3/uL (ref 0.7–3.1)
Lymphs: 25 %
MCH: 28.9 pg (ref 26.6–33.0)
MCHC: 34.9 g/dL (ref 31.5–35.7)
MCV: 83 fL (ref 79–97)
Monocytes Absolute: 0.6 10*3/uL (ref 0.1–0.9)
Monocytes: 9 %
Neutrophils Absolute: 4.2 10*3/uL (ref 1.4–7.0)
Neutrophils: 64 %
Platelets: 247 10*3/uL (ref 150–450)
RBC: 5.53 x10E6/uL — ABNORMAL HIGH (ref 3.77–5.28)
RDW: 13 % (ref 11.7–15.4)
WBC: 6.5 10*3/uL (ref 3.4–10.8)

## 2018-11-06 LAB — CMP14+EGFR
ALT: 25 IU/L (ref 0–32)
AST: 24 IU/L (ref 0–40)
Albumin/Globulin Ratio: 1.4 (ref 1.2–2.2)
Albumin: 4.1 g/dL (ref 3.7–4.7)
Alkaline Phosphatase: 120 IU/L — ABNORMAL HIGH (ref 39–117)
BUN/Creatinine Ratio: 13 (ref 12–28)
BUN: 14 mg/dL (ref 8–27)
Bilirubin Total: 0.7 mg/dL (ref 0.0–1.2)
CO2: 27 mmol/L (ref 20–29)
Calcium: 9.7 mg/dL (ref 8.7–10.3)
Chloride: 101 mmol/L (ref 96–106)
Creatinine, Ser: 1.08 mg/dL — ABNORMAL HIGH (ref 0.57–1.00)
GFR calc Af Amer: 60 mL/min/{1.73_m2} (ref >59)
GFR calc non Af Amer: 52 mL/min/{1.73_m2} — ABNORMAL LOW (ref >59)
Globulin, Total: 2.9 g/dL (ref 1.5–4.5)
Glucose: 102 mg/dL — ABNORMAL HIGH (ref 65–99)
Potassium: 4.9 mmol/L (ref 3.5–5.2)
Sodium: 141 mmol/L (ref 134–144)
Total Protein: 7 g/dL (ref 6.0–8.5)

## 2018-11-06 LAB — HEPATITIS C ANTIBODY: Hep C Virus Ab: 0.1 s/co ratio (ref 0.0–0.9)

## 2018-11-06 LAB — LIPID PANEL
Chol/HDL Ratio: 4.5 ratio — ABNORMAL HIGH (ref 0.0–4.4)
Cholesterol, Total: 174 mg/dL (ref 100–199)
HDL: 39 mg/dL — ABNORMAL LOW (ref >39)
LDL Chol Calc (NIH): 108 mg/dL — ABNORMAL HIGH (ref 0–99)
Triglycerides: 153 mg/dL — ABNORMAL HIGH (ref 0–149)
VLDL Cholesterol Cal: 27 mg/dL (ref 5–40)

## 2018-11-20 ENCOUNTER — Other Ambulatory Visit: Payer: Self-pay

## 2018-11-20 ENCOUNTER — Ambulatory Visit (INDEPENDENT_AMBULATORY_CARE_PROVIDER_SITE_OTHER): Payer: Medicare HMO | Admitting: Cardiology

## 2018-11-20 ENCOUNTER — Encounter: Payer: Self-pay | Admitting: Cardiology

## 2018-11-20 VITALS — BP 142/78 | HR 72 | Ht 70.0 in | Wt 213.6 lb

## 2018-11-20 DIAGNOSIS — I471 Supraventricular tachycardia: Secondary | ICD-10-CM | POA: Diagnosis not present

## 2018-11-20 MED ORDER — HYDROCHLOROTHIAZIDE 25 MG PO TABS: 25.0000 mg | ORAL_TABLET | Freq: Every day | ORAL | 3 refills | Status: DC

## 2018-11-20 NOTE — Progress Notes (Signed)
Electrophysiology Office Note   Date:  11/20/2018   ID:  Alison Garrett, DOB 11-23-1945, MRN [id number]  PCP:  Dettinger, Fransisca Kaufmann, MD  Cardiologist:  Hochrein Primary Electrophysiologist:  Lurline Caver Meredith Leeds, MD    No chief complaint on file.    History of Present Illness: Alison Garrett is a 73 y.o. female who is being seen today for the evaluation of atrial fibrillation, SVT at the request of Chipper Herb, MD. Presenting today for electrophysiology evaluation.  She has a history of hypertension and hyperlipidemia.  She presented to the hospital September 2019 with chest pain and was found to have a small PE.  At follow-up she was noted to have palpitations and wore a ZIO patch that showed episodes of SVT for up to 23 seconds.  She was started on metoprolol, though she did not tolerate this.  She has been getting palpitations doing activities, though she does not feel like this is due to her atrial fibrillation.  Her symptoms occur most days.  They occur mainly when she is exerting herself.  She says that her heart rate gets up to 110 bpm which makes her feel poorly.  She did wear a cardiac monitor that showed symptoms at times associated with SVT and at times associated with normal rhythm.  Today, denies symptoms of chest pain, shortness of breath, orthopnea, PND, lower extremity edema, claudication, dizziness, presyncope, syncope, bleeding, or neurologic sequela. The patient is tolerating medications without difficulties.  She continues to have episodic palpitations.  Her palpitations are somewhat limiting.  She says that they occur mostly when she exercises and are improved with rest.  They also wake her up when she is sleeping.  No exacerbating or alleviating factors.   Past Medical History:  Diagnosis Date  . Afib ([place])   . Complication of anesthesia    propathal   . Dyslipidemia   . Essential tremor 01/12/2014  . Fibromyalgia    Not currently under active treatment  . GERD  (gastroesophageal reflux disease)   . Hiatal hernia   . Hypertension   . Obesity    Past Surgical History:  Procedure Laterality Date  . ABDOMINAL HYSTERECTOMY       Current Outpatient Medications  Medication Sig Dispense Refill  . aspirin EC 81 MG tablet Take 81 mg by mouth daily.    . hydrochlorothiazide (HYDRODIURIL) 25 MG tablet Take 1 tablet (25 mg total) by mouth daily. 90 tablet 3   No current facility-administered medications for this visit.     Allergies:   Contrast media [iodinated diagnostic agents], Diltiazem, Metoprolol, Penicillins, Sulfonamide derivatives, Benicar [olmesartan], Celebrex [celecoxib], Cymbalta [duloxetine hcl], Latex, Neurontin [gabapentin], Norvasc [amlodipine besylate], Prozac [fluoxetine hcl], Xanax [alprazolam], Zoloft [sertraline hcl], and Zyprexa [olanzapine]   Social History:  The patient  reports that she has never smoked. She has never used smokeless tobacco. She reports that she does not drink alcohol or use drugs.   Family History:  The patient's family history includes COPD in her father; Cancer in her mother; Coronary artery disease in her mother; Diabetes in her sister; Heart disease in her mother; Hepatitis in her sister.    ROS:  Please see the history of present illness.   Otherwise, review of systems is positive for none.   All other systems are reviewed and negative.   PHYSICAL EXAM: VS:  BP (!) 142/78   Pulse 72   Ht 5\' 10"  (1.778 m)   Wt 213 lb 9.6 oz (  96.9 kg)   BMI 30.65 kg/m  , BMI Body mass index is 30.65 kg/m. GEN: Well nourished, well developed, in no acute distress  HEENT: normal  Neck: no JVD, carotid bruits, or masses Cardiac: RRR; no murmurs, rubs, or gallops,no edema  Respiratory:  clear to auscultation bilaterally, normal work of breathing GI: soft, nontender, nondistended, + BS MS: no deformity or atrophy  Skin: warm and dry Neuro:  Strength and sensation are intact Psych: euthymic mood, full affect  EKG:   EKG is ordered today. Personal review of the ekg ordered  shows rhythm  Recent Labs: 11/05/2018: ALT 25; BUN 14; Creatinine, Ser 1.08; Hemoglobin 16.0; Platelets 247; Potassium 4.9; Sodium 141    Lipid Panel     Component Value Date/Time   CHOL 174 11/05/2018 0908   CHOL 170 06/04/2012 0925   TRIG 153 (H) 11/05/2018 0908   TRIG CANCELED 08/13/2015 0816   TRIG 94 06/04/2012 0925   HDL 39 (L) 11/05/2018 0908   HDL CANCELED 08/13/2015 0816   HDL 41 06/04/2012 0925   CHOLHDL 4.5 (H) 11/05/2018 0908   LDLCALC 108 (H) 11/05/2018 0908   LDLCALC 105 (H) 06/18/2013 0834   LDLCALC 110 (H) 06/04/2012 0925     Wt Readings from Last 3 Encounters:  11/20/18 213 lb 9.6 oz (96.9 kg)  11/05/18 213 lb 6.4 oz (96.8 kg)  09/11/18 214 lb 9.6 oz (97.3 kg)      Other studies Reviewed: Additional studies/ records that were reviewed today include: Monitor 08/05/2018 personally reviewed Review of the above records today demonstrates:  NSR Brief runs of SVT The longest run was 142 bpm.  ASSESSMENT AND PLAN:  1.  Paroxysmal atrial fibrillation: He on Eliquis.  She continues to have minimal episodes of atrial fibrillation.  No changes.    This patients CHA2DS2-VASc Score and unadjusted Ischemic Stroke Rate (% per year) is equal to 3.2 % stroke rate/year from a score of 3  Above score calculated as 1 point each if present [CHF, HTN, DM, Vascular=MI/PAD/Aortic Plaque, Age if 65-74, or Female] Above score calculated as 2 points each if present [Age > 75, or Stroke/TIA/TE]   2.  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  Vagal maneuvers have been discussed in the past.  Continues to have episodes of SVT.  She has not tolerated medications at this point.  Due to that, we Seema Blum plan for ablation.  Risks and benefits were discussed and include bleeding, tamponade, heart block, stroke.  The patient understand these risks and is agreed to the procedure.  3.  Hypertension:  Elevated today.  Kiora Hallberg increase hydrochlorothiazide to 1 tablet a day.  4.  Pulmonary embolism: On Eliquis    Current medicines are reviewed at length with the patient today.   The patient does not have concerns regarding her medicines.  The following changes were made today: Increase hydrochlorothiazide  Labs/ tests ordered today include:  Orders Placed This Encounter  Procedures  . EKG 12-Lead    Disposition:   FU with Branton Einstein 3 months  Signed, Meeah Totino Meredith Leeds, MD  11/20/2018 10:52 AM     [facility] [address] [place] [address] [phone] (office) [phone] (fax)

## 2018-11-20 NOTE — Patient Instructions (Addendum)
Medication Instructions:  Your physician has recommended you make the following change in your medication:  1. INCREASE Hydrochlorothiazide to 25 mg once daily  Labwork: None ordered  Testing/Procedures: Your physician has recommended that you have an ablation. Catheter ablation is a medical procedure used to treat some cardiac arrhythmias (irregular heartbeats). During catheter ablation, a long, thin, flexible tube is put into a blood vessel in your groin (upper thigh), or neck. This tube is called an ablation catheter. It is then guided to your heart through the blood vessel. Radio frequency waves destroy small areas of heart tissue where abnormal heartbeats may cause an arrhythmia to start.  Follow-Up: Your physician recommends that you follow up on 02/04/19 @ 1:45 pm with Dr. Curt Bears  * If you need a refill on your cardiac medications before your next appointment, please call your pharmacy.   Thank you for choosing [facility]!!   Trinidad Curet, RN [phone]  Any Other Special Instructions Will Be Listed Below (If Applicable).    Electrophysiology/Ablation Procedure Instructions   You are scheduled for a(n)  ablation on 12/19/2018 with Dr. Allegra Lai.   1.   Pre procedure testing-             A.  COVID TEST-- On 12/16/18 @ 2:00 pm - You will go to [facility] hospital ([place]) for your Covid testing.   This is a drive thru test site.  There will be multiple testing areas.  Be sure to share with the first checkpoint that you are there for pre-procedure/surgery testing. This will put you into the right (yellow) lane that leads to the PAT testing team. Stay in your car and the nurse team will come to your car to test you.  After you are tested please go home and self quarantine until the day of your procedure.     2. On the day of your procedure 12/19/2018 you will go to [facility] [phone]) at 10:30 am.  Dennis Bast will go to the main  entrance A [employer]) and enter where the [employer] are.  Your driver will drop you off and you will head down the hallway to ADMITTING.  You may have one support person come in to the hospital with you.  They will be asked to wait in the waiting room.   3.   Do not eat or drink after midnight prior to your procedure.   4.   Do NOT take any medications the morning of your procedure.   5.  Plan for an overnight stay.  If you use your phone frequently bring your phone charger.   6. You will follow up with Dr. Curt Bears 4 weeks after your procedure. This appointment will be made for you.   * If you have ANY questions please call the office [phone] and ask for Ranjit Ashurst RN or send me a MyChart message   * Occasionally, EP Studies and ablations can become lengthy.  Please make your family aware of this before your procedure starts.  Average time ranges from 2-8 hours for EP studies/ablations.  Your physician will call your family after the procedure with the results.                                   Cardiac Ablation Cardiac ablation is a procedure to disable (ablate) a small amount of heart tissue  in very specific places. The heart has many electrical connections. Sometimes these connections are abnormal and can cause the heart to beat very fast or irregularly. Ablating some of the problem areas can improve the heart rhythm or return it to normal. Ablation may be done for people who:  Have Wolff-Parkinson-White syndrome.  Have fast heart rhythms (tachycardia).  Have taken medicines for an abnormal heart rhythm (arrhythmia) that were not effective or caused side effects.  Have a high-risk heartbeat that may be life-threatening.  During the procedure, a small incision is made in the neck or the groin, and a long, thin, flexible tube (catheter) is inserted into the incision and moved to the heart. Small devices (electrodes) on the tip of the catheter will send out electrical  currents. A type of X-ray (fluoroscopy) will be used to help guide the catheter and to provide images of the heart. Tell a health care provider about:  Any allergies you have.  All medicines you are taking, including vitamins, herbs, eye drops, creams, and over-the-counter medicines.  Any problems you or family members have had with anesthetic medicines.  Any blood disorders you have.  Any surgeries you have had.  Any medical conditions you have, such as kidney failure.  Whether you are pregnant or may be pregnant. What are the risks? Generally, this is a safe procedure. However, problems may occur, including:  Infection.  Bruising and bleeding at the catheter insertion site.  Bleeding into the chest, especially into the sac that surrounds the heart. This is a serious complication.  Stroke or blood clots.  Damage to other structures or organs.  Allergic reaction to medicines or dyes.  Need for a permanent pacemaker if the normal electrical system is damaged. A pacemaker is a small computer that sends electrical signals to the heart and helps your heart beat normally.  The procedure not being fully effective. This may not be recognized until months later. Repeat ablation procedures are sometimes required.  What happens before the procedure?  Follow instructions from your health care provider about eating or drinking restrictions.  Ask your health care provider about: ? Changing or stopping your regular medicines. This is especially important if you are taking diabetes medicines or blood thinners. ? Taking medicines such as aspirin and ibuprofen. These medicines can thin your blood. Do not take these medicines before your procedure if your health care provider instructs you not to.  Plan to have someone take you home from the hospital or clinic.  If you will be going home right after the procedure, plan to have someone with you for 24 hours. What happens during the  procedure?  To lower your risk of infection: ? Your health care team will wash or sanitize their hands. ? Your skin will be washed with soap. ? Hair may be removed from the incision area.  An IV tube will be inserted into one of your veins.  You will be given a medicine to help you relax (sedative).  The skin on your neck or groin will be numbed.  An incision will be made in your neck or your groin.  A needle will be inserted through the incision and into a large vein in your neck or groin.  A catheter will be inserted into the needle and moved to your heart.  Dye may be injected through the catheter to help your surgeon see the area of the heart that needs treatment.  Electrical currents will be sent from the catheter to  ablate heart tissue in desired areas. There are three types of energy that may be used to ablate heart tissue: ? Heat (radiofrequency energy). ? Laser energy. ? Extreme cold (cryoablation).  When the necessary tissue has been ablated, the catheter will be removed.  Pressure will be held on the catheter insertion area to prevent excessive bleeding.  A bandage (dressing) will be placed over the catheter insertion area. The procedure may vary among health care providers and hospitals. What happens after the procedure?  Your blood pressure, heart rate, breathing rate, and blood oxygen level will be monitored until the medicines you were given have worn off.  Your catheter insertion area will be monitored for bleeding. You will need to lie still for a few hours to ensure that you do not bleed from the catheter insertion area.  Do not drive for 5-7 days or as long as directed by your health care provider. Summary  Cardiac ablation is a procedure to disable (ablate) a small amount of heart tissue in very specific places. Ablating some of the problem areas can improve the heart rhythm or return it to normal.  During the procedure, electrical currents will be sent  from the catheter to ablate heart tissue in desired areas. This information is not intended to replace advice given to you by your health care provider. Make sure you discuss any questions you have with your health care provider. Document Released: 06/17/2008 Document Revised: 12/19/2015 Document Reviewed: 12/19/2015 Elsevier Interactive Patient Education  [employer].

## 2018-11-26 ENCOUNTER — Ambulatory Visit (INDEPENDENT_AMBULATORY_CARE_PROVIDER_SITE_OTHER): Payer: Medicare HMO | Admitting: *Deleted

## 2018-11-26 DIAGNOSIS — Z0000 Encounter for general adult medical examination without abnormal findings: Secondary | ICD-10-CM | POA: Diagnosis not present

## 2018-11-26 NOTE — Progress Notes (Signed)
MEDICARE ANNUAL WELLNESS VISIT  11/26/2018  Telephone Visit Disclaimer This Medicare AWV was conducted by telephone due to national recommendations for restrictions regarding the COVID-19 Pandemic (e.g. social distancing).  I verified, using two identifiers, that I am speaking with Alison Garrett or their authorized healthcare agent. I discussed the limitations, risks, security, and privacy concerns of performing an evaluation and management service by telephone and the potential availability of an in-person appointment in the future. The patient expressed understanding and agreed to proceed.   Subjective:  Alison Garrett is a 73 y.o. female patient of Dettinger, Fransisca Kaufmann, MD who had a Medicare Annual Wellness Visit today via telephone. [name] is Working full time and lives alone with her dogs. she has 3 children. she reports that she is socially active and does interact with friends/family regularly. she is moderately physically active and enjoys reading, doing word search puzzles and riding around in the mountains especially this time of year with all the leaves changing colors.  Patient Care Team: Dettinger, Fransisca Kaufmann, MD as PCP - General (Family Medicine) Minus Breeding, MD as PCP - Cardiology (Cardiology) Constance Haw, MD as PCP - Electrophysiology (Cardiology) Ilean China, RN as Registered Nurse  Advanced Directives 11/26/2018 11/18/2017 11/05/2017 11/05/2017 07/01/2016 07/01/2016 10/05/2015  Does Patient Have a Medical Advance Directive? No Yes No No No No No  Type of Advance Directive - Living will;Healthcare Power of Attorney - - - - -  Does patient want to make changes to medical advance directive? - No - Patient declined - - - - -  Copy of [place] in Chart? - No - copy requested - - - - -  Would patient like information on creating a medical advance directive? No - Patient declined - No - Patient declined - No - Patient declined No - Patient declined No -  patient declined information    Hospital Utilization Over the Past 12 Months: # of hospitalizations or ER visits: 0 # of surgeries: 0  Review of Systems    Patient reports that her overall health is unchanged compared to last year.  History obtained from chart review  Patient Reported Readings (BP, Pulse, CBG, Weight, etc) none  Pain Assessment Pain : No/denies pain     Current Medications & Allergies (verified) Allergies as of 11/26/2018      Reactions   Contrast Media [iodinated Diagnostic Agents] Anaphylaxis   Diltiazem Other (See Comments)   Pt. Had pain, jitters and felt terrible   Metoprolol Nausea And Vomiting   Penicillins Anaphylaxis   Has patient had a PCN reaction causing immediate rash, facial/tongue/throat swelling, SOB or lightheadedness with hypotension: yes Has patient had a PCN reaction causing severe rash involving mucus membranes or skin necrosis: yes Has patient had a PCN reaction that required hospitalization unknown Has patient had a PCN reaction occurring within the last 10 years: no If all of the above answers are "NO", then may proceed with Cephalo   Sulfonamide Derivatives Hives   Benicar [olmesartan] Rash   Celebrex [celecoxib] Anxiety   Cymbalta [duloxetine Hcl] Itching   Latex Rash   Neurontin [gabapentin] Nausea Only   Norvasc [amlodipine Besylate] Swelling   Prozac [fluoxetine Hcl] Other (See Comments)   Xanax [alprazolam] Other (See Comments)   Zoloft [sertraline Hcl] Itching   Zyprexa [olanzapine] Other (See Comments)   Wt gain      Medication List       Accurate as of November 26, 2018  9:49 AM. If you have any questions, ask your nurse or doctor.        aspirin EC 81 MG tablet Take 81 mg by mouth daily.   hydrochlorothiazide 25 MG tablet Commonly known as: HYDRODIURIL Take 1 tablet (25 mg total) by mouth daily.       History (reviewed): Past Medical History:  Diagnosis Date   Afib ([place])    Complication of  anesthesia    propathal    Dyslipidemia    Essential tremor 01/12/2014   Fibromyalgia    Not currently under active treatment   GERD (gastroesophageal reflux disease)    Hiatal hernia    Hypertension    Obesity    Past Surgical History:  Procedure Laterality Date   ABDOMINAL HYSTERECTOMY     Family History  Problem Relation Age of Onset   Coronary artery disease Mother    Heart disease Mother        CABG   Cancer Mother        trachea   COPD Father    Hepatitis Sister        autoimune   Diabetes Sister    Social History   Socioeconomic History   Marital status: Divorced    Spouse name: Not on file   Number of children: 3   Years of education: some college   Highest education level: Some college, no degree  Occupational History    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, product/process development strain: Not hard at all   Food insecurity    Worry: Never true    Inability: Never true   Transportation needs    Medical: No    Non-medical: No  Tobacco Use   Smoking status: Never Smoker   Smokeless tobacco: Never Used  Substance and Sexual Activity   Alcohol use: No    Alcohol/week: 0.0 standard drinks   Drug use: No   Sexual activity: Not Currently  Lifestyle   Physical activity    Days per week: 6 days    Minutes per session: 60 min   Stress: Only a little  Relationships   Social connections    Talks on phone: More than three times a week    Gets together: More than three times a week    Attends religious service: Never    Active member of club or organization: No    Attends meetings of clubs or organizations: Never    Relationship status: Divorced  Other Topics Concern   Not on file  Social History Narrative   Lives in [place].   Patient is right handed   Drinks very little caffeine.    Activities of Daily Living In your present state of health, do you have any difficulty performing the  following activities: 11/26/2018 09/01/2018  Hearing? N N  Vision? N N  Comment gets yearly eye exam -  Difficulty concentrating or making decisions? N N  Walking or climbing stairs? N N  Dressing or bathing? N N  Doing errands, shopping? N -  Preparing Food and eating ? N N  Using the Toilet? N N  In the past six months, have you accidently leaked urine? N N  Do you have problems with loss of bowel control? N N  Managing your Medications? N N  Managing your Finances? N N  Housekeeping or managing your Housekeeping? 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   Depression Screen PHQ 2/9 Scores 11/26/2018 11/05/2018 09/11/2018 09/01/2018 12/18/2017 11/18/2017 06/12/2017  PHQ - 2 Score 0 0 0 0 0 0 0  PHQ- 9 Score - - - - - - -     Fall Risk Fall Risk  11/26/2018 11/05/2018 09/11/2018 12/18/2017 11/18/2017  Falls in the past year? 0 0 0 0 No  Number falls in past yr: 0 - - - -  Injury with Fall? 0 - - - -  Follow up Falls prevention discussed - - - -  Comment Get rid of all throw rugs in the house, adequate lighting in the walkways and grab bars in the bathroom. - - - -     Objective:  Alison Garrett seemed alert and oriented and she participated appropriately during our telephone visit.  Blood  Pressure Weight BMI  BP Readings from Last 3 Encounters:  11/20/18 (!) 142/78  11/05/18 (!) 138/91  09/11/18 (!) 144/98   Wt Readings from Last 3 Encounters:  11/20/18 213 lb 9.6 oz (96.9 kg)  11/05/18 213 lb 6.4 oz (96.8 kg)  09/11/18 214 lb 9.6 oz (97.3 kg)   BMI Readings from Last 1 Encounters:  11/20/18 30.65 kg/m    *Unable to obtain current vital signs, weight, and BMI due to telephone visit type  Hearing/Vision   Alison Garrett did not seem to have difficulty with hearing/understanding during the telephone conversation  Reports that she has had a formal eye exam by an eye care professional within the past year  Reports that she has not had a formal hearing evaluation within the past year *Unable to fully assess hearing and vision during telephone visit type  Cognitive Function: 6CIT Screen 11/26/2018  What Year? 0 points  What month? 0 points  What time? 0 points  Count back from 20 0 points  Months in reverse 0 points  Repeat phrase 2 points  Total Score 2   (Normal:0-7, Significant for Dysfunction: >8)  Normal Cognitive Function Screening: Yes   Immunization & Health Maintenance Record  There is no immunization history on file for this patient.  Health Maintenance  Topic Date Due   COLONOSCOPY  11/05/2019 (Originally 03/25/2013)   TETANUS/TDAP  11/05/2019 (Originally 12/14/2014)   INFLUENZA VACCINE  01/18/2020 (Originally 09/13/2018)   MAMMOGRAM  06/13/2019   COLON CANCER SCREENING ANNUAL FOBT  10/08/2019   DEXA SCAN  Completed   Hepatitis C Screening  Completed   PNA vac Low Risk Adult  Discontinued       Assessment  This is a routine wellness examination for Alison Garrett.  Health Maintenance: Due or Overdue There are no preventive care reminders to display for this patient.  Alison Garrett does not need a referral for Community Assistance: Care Management:   no Social Work:    no Prescription Assistance:  no  Nutrition/Diabetes Education:  no     Plan:  Personalized Goals Goals Addressed            This Visit's Progress    DIET - INCREASE WATER INTAKE       Try to drink 6-8 glasses of water daily.      Personalized Health Maintenance & Screening Recommendations  Influenza vaccine Td vaccine Shingles vaccine  Lung Cancer Screening Recommended: no (Low Dose CT Chest recommended if Age 50-80 years, 30 pack-year currently smoking OR have quit w/in past 15 years) Hepatitis C Screening recommended: no HIV Screening recommended: no  Advanced Directives: Written information was not prepared per patient's request.  Referrals & Orders No orders of the defined types were placed in this encounter.   Follow-up Plan  Follow-up with Dettinger, Fransisca Kaufmann, MD as planned  Consider TDAP, Shingles and Flu vaccines at your next visit with your PCP   I have personally reviewed and noted the following in the patients chart:    Medical and social history  Use of alcohol, tobacco or illicit drugs   Current medications and supplements  Functional ability and status  Nutritional status  Physical activity  Advanced directives  List of other physicians  Hospitalizations, surgeries, and ER visits in previous 12 months  Vitals  Screenings to include cognitive, depression, and falls  Referrals and appointments  In addition, I have reviewed and discussed with Alison Garrett certain preventive protocols, quality metrics, and best practice recommendations. A written personalized care plan for preventive services as well as general preventive health recommendations is available and can be mailed to the patient at her request.      Milas Hock, LPN  [id number]

## 2018-11-26 NOTE — Patient Instructions (Signed)
Preventive Care 72 Years and Older, Female Preventive care refers to lifestyle choices and visits with your health care provider that can promote health and wellness. This includes:  A yearly physical exam. This is also called an annual well check.  Regular dental and eye exams.  Immunizations.  Screening for certain conditions.  Healthy lifestyle choices, such as diet and exercise. What can I expect for my preventive care visit? Physical exam Your health care provider will check:  Height and weight. These may be used to calculate body mass index (BMI), which is a measurement that tells if you are at a healthy weight.  Heart rate and blood pressure.  Your skin for abnormal spots. Counseling Your health care provider may ask you questions about:  Alcohol, tobacco, and drug use.  Emotional well-being.  Home and relationship well-being.  Sexual activity.  Eating habits.  History of falls.  Memory and ability to understand (cognition).  Work and work Statistician.  Pregnancy and menstrual history. What immunizations do I need?  Influenza (flu) vaccine  This is recommended every year. Tetanus, diphtheria, and pertussis (Tdap) vaccine  You may need a Td booster every 10 years. Varicella (chickenpox) vaccine  You may need this vaccine if you have not already been vaccinated. Zoster (shingles) vaccine  You may need this after age 72. Pneumococcal conjugate (PCV13) vaccine  One dose is recommended after age 72. Pneumococcal polysaccharide (PPSV23) vaccine  One dose is recommended after age 72. Measles, mumps, and rubella (MMR) vaccine  You may need at least one dose of MMR if you were born in 1957 or later. You may also need a second dose. Meningococcal conjugate (MenACWY) vaccine  You may need this if you have certain conditions. Hepatitis A vaccine  You may need this if you have certain conditions or if you travel or work in places where you may be exposed  to hepatitis A. Hepatitis B vaccine  You may need this if you have certain conditions or if you travel or work in places where you may be exposed to hepatitis B. Haemophilus influenzae type b (Hib) vaccine  You may need this if you have certain conditions. You may receive vaccines as individual doses or as more than one vaccine together in one shot (combination vaccines). Talk with your health care provider about the risks and benefits of combination vaccines. What tests do I need? Blood tests  Lipid and cholesterol levels. These may be checked every 5 years, or more frequently depending on your overall health.  Hepatitis C test.  Hepatitis B test. Screening  Lung cancer screening. You may have this screening every year starting at age 72 if you have a 30-pack-year history of smoking and currently smoke or have quit within the past 15 years.  Colorectal cancer screening. All adults should have this screening starting at age 72 and continuing until age 15. Your health care provider may recommend screening at age 23 if you are at increased risk. You will have tests every 1-10 years, depending on your results and the type of screening test.  Diabetes screening. This is done by checking your blood sugar (glucose) after you have not eaten for a while (fasting). You may have this done every 1-3 years.  Mammogram. This may be done every 1-2 years. Talk with your health care provider about how often you should have regular mammograms.  BRCA-related cancer screening. This may be done if you have a family history of breast, ovarian, tubal, or peritoneal cancers.  Other tests  Sexually transmitted disease (STD) testing.  Bone density scan. This is done to screen for osteoporosis. You may have this done starting at age 72. Follow these instructions at home: Eating and drinking  Eat a diet that includes fresh fruits and vegetables, whole grains, lean protein, and low-fat dairy products. Limit  your intake of foods with high amounts of sugar, saturated fats, and salt.  Take vitamin and mineral supplements as recommended by your health care provider.  Do not drink alcohol if your health care provider tells you not to drink.  If you drink alcohol: ? Limit how much you have to 0-1 drink a day. ? Be aware of how much alcohol is in your drink. In the [place] one drink equals one 12 oz bottle of beer (355 mL), one 5 oz glass of wine (148 mL), or one 1 oz glass of hard liquor (44 mL). Lifestyle  Take daily care of your teeth and gums.  Stay active. Exercise for at least 30 minutes on 5 or more days each week.  Do not use any products that contain nicotine or tobacco, such as cigarettes, e-cigarettes, and chewing tobacco. If you need help quitting, ask your health care provider.  If you are sexually active, practice safe sex. Use a condom or other form of protection in order to prevent STIs (sexually transmitted infections).  Talk with your health care provider about taking a low-dose aspirin or statin. What's next?  Go to your health care provider once a year for a well check visit.  Ask your health care provider how often you should have your eyes and teeth checked.  Stay up to date on all vaccines. This information is not intended to replace advice given to you by your health care provider. Make sure you discuss any questions you have with your health care provider. Document Released: 02/25/2015 Document Revised: 01/23/2018 Document Reviewed: 01/23/2018 Elsevier Patient Education  2020 [employer].

## 2018-12-01 ENCOUNTER — Ambulatory Visit: Payer: Medicare HMO | Admitting: Cardiology

## 2018-12-16 ENCOUNTER — Other Ambulatory Visit (HOSPITAL_COMMUNITY)
Admission: RE | Admit: 2018-12-16 | Discharge: 2018-12-16 | Disposition: A | Discharge status: Home / Self Care | Payer: Medicare HMO | Source: Ambulatory Visit | Attending: Cardiology | Admitting: Cardiology

## 2018-12-16 DIAGNOSIS — Z01812 Encounter for preprocedural laboratory examination: Secondary | ICD-10-CM | POA: Insufficient documentation

## 2018-12-16 DIAGNOSIS — Z20828 Contact with and (suspected) exposure to other viral communicable diseases: Secondary | ICD-10-CM | POA: Insufficient documentation

## 2018-12-17 LAB — NOVEL CORONAVIRUS, NAA (HOSP ORDER, SEND-OUT TO REF LAB; TAT 18-24 HRS): SARS-CoV-2, NAA: NOT DETECTED

## 2018-12-19 ENCOUNTER — Ambulatory Visit (HOSPITAL_COMMUNITY)
Admission: RE | Admit: 2018-12-19 | Discharge: 2018-12-19 | Disposition: A | Discharge status: Home / Self Care | Payer: Medicare HMO | Attending: Cardiology | Admitting: Cardiology

## 2018-12-19 ENCOUNTER — Encounter (HOSPITAL_COMMUNITY)
Admission: RE | Disposition: A | Discharge status: Home / Self Care | Payer: Self-pay | Source: Home / Self Care | Attending: Cardiology

## 2018-12-19 ENCOUNTER — Other Ambulatory Visit: Payer: Self-pay

## 2018-12-19 DIAGNOSIS — K219 Gastro-esophageal reflux disease without esophagitis: Secondary | ICD-10-CM | POA: Diagnosis not present

## 2018-12-19 DIAGNOSIS — Z86711 Personal history of pulmonary embolism: Secondary | ICD-10-CM | POA: Insufficient documentation

## 2018-12-19 DIAGNOSIS — Z7982 Long term (current) use of aspirin: Secondary | ICD-10-CM | POA: Diagnosis not present

## 2018-12-19 DIAGNOSIS — I480 Paroxysmal atrial fibrillation: Secondary | ICD-10-CM | POA: Insufficient documentation

## 2018-12-19 DIAGNOSIS — Z79899 Other long term (current) drug therapy: Secondary | ICD-10-CM | POA: Diagnosis not present

## 2018-12-19 DIAGNOSIS — M797 Fibromyalgia: Secondary | ICD-10-CM | POA: Diagnosis not present

## 2018-12-19 DIAGNOSIS — I10 Essential (primary) hypertension: Secondary | ICD-10-CM | POA: Insufficient documentation

## 2018-12-19 DIAGNOSIS — E669 Obesity, unspecified: Secondary | ICD-10-CM | POA: Insufficient documentation

## 2018-12-19 DIAGNOSIS — Z6830 Body mass index (BMI) 30.0-30.9, adult: Secondary | ICD-10-CM | POA: Diagnosis not present

## 2018-12-19 DIAGNOSIS — I471 Supraventricular tachycardia: Secondary | ICD-10-CM | POA: Insufficient documentation

## 2018-12-19 DIAGNOSIS — Z7901 Long term (current) use of anticoagulants: Secondary | ICD-10-CM | POA: Diagnosis not present

## 2018-12-19 DIAGNOSIS — E785 Hyperlipidemia, unspecified: Secondary | ICD-10-CM | POA: Diagnosis not present

## 2018-12-19 HISTORY — PX: SVT ABLATION: EP1225

## 2018-12-19 LAB — BASIC METABOLIC PANEL
Anion gap: 8 (ref 5–15)
BUN: 14 mg/dL (ref 8–23)
CO2: 29 mmol/L (ref 22–32)
Calcium: 9.1 mg/dL (ref 8.9–10.3)
Chloride: 102 mmol/L (ref 98–111)
Creatinine, Ser: 1.14 mg/dL — ABNORMAL HIGH (ref 0.44–1.00)
GFR calc Af Amer: 56 mL/min — ABNORMAL LOW (ref >60)
GFR calc non Af Amer: 48 mL/min — ABNORMAL LOW (ref >60)
Glucose, Bld: 123 mg/dL — ABNORMAL HIGH (ref 70–99)
Potassium: 3.7 mmol/L (ref 3.5–5.1)
Sodium: 139 mmol/L (ref 135–145)

## 2018-12-19 LAB — CBC
HCT: 45.5 % (ref 36.0–46.0)
Hemoglobin: 15.4 g/dL — ABNORMAL HIGH (ref 12.0–15.0)
MCH: 29.5 pg (ref 26.0–34.0)
MCHC: 33.8 g/dL (ref 30.0–36.0)
MCV: 87.2 fL (ref 80.0–100.0)
Platelets: 229 10*3/uL (ref 150–400)
RBC: 5.22 MIL/uL — ABNORMAL HIGH (ref 3.87–5.11)
RDW: 12.9 % (ref 11.5–15.5)
WBC: 6.6 10*3/uL (ref 4.0–10.5)
nRBC: 0 % (ref 0.0–0.2)

## 2018-12-19 SURGERY — SVT ABLATION
Anesthesia: LOCAL

## 2018-12-19 MED ORDER — MIDAZOLAM HCL 5 MG/5ML IJ SOLN
INTRAMUSCULAR | Status: AC
  Filled 2018-12-19: qty 5

## 2018-12-19 MED ORDER — SODIUM CHLORIDE 0.9 % IV SOLN
INTRAVENOUS | Status: DC
  Administered 2018-12-19: 08:00:00 via INTRAVENOUS

## 2018-12-19 MED ORDER — SODIUM CHLORIDE 0.9 % IV SOLN: 250.0000 mL | INTRAVENOUS | Status: DC | PRN

## 2018-12-19 MED ORDER — HEPARIN (PORCINE) IN NACL 1000-0.9 UT/500ML-% IV SOLN
INTRAVENOUS | Status: AC
  Filled 2018-12-19: qty 500

## 2018-12-19 MED ORDER — MIDAZOLAM HCL 5 MG/5ML IJ SOLN
INTRAMUSCULAR | Status: DC | PRN
  Administered 2018-12-19 (×5): 1 mg via INTRAVENOUS

## 2018-12-19 MED ORDER — SODIUM CHLORIDE 0.9% FLUSH: 3.0000 mL | Freq: Two times a day (BID) | INTRAVENOUS | Status: DC

## 2018-12-19 MED ORDER — HEPARIN (PORCINE) IN NACL 1000-0.9 UT/500ML-% IV SOLN
INTRAVENOUS | Status: DC | PRN
  Administered 2018-12-19: 500 mL

## 2018-12-19 MED ORDER — ONDANSETRON HCL 4 MG/2ML IJ SOLN: 4.0000 mg | Freq: Four times a day (QID) | INTRAMUSCULAR | Status: DC | PRN

## 2018-12-19 MED ORDER — ISOPROTERENOL HCL 0.2 MG/ML IJ SOLN
INTRAMUSCULAR | Status: AC
  Filled 2018-12-19: qty 5

## 2018-12-19 MED ORDER — FENTANYL CITRATE (PF) 100 MCG/2ML IJ SOLN
INTRAMUSCULAR | Status: DC | PRN
  Administered 2018-12-19 (×2): 12.5 ug via INTRAVENOUS
  Administered 2018-12-19 (×2): 25 ug via INTRAVENOUS
  Administered 2018-12-19 (×2): 12.5 ug via INTRAVENOUS

## 2018-12-19 MED ORDER — FENTANYL CITRATE (PF) 100 MCG/2ML IJ SOLN
INTRAMUSCULAR | Status: AC
  Filled 2018-12-19: qty 2

## 2018-12-19 MED ORDER — ACETAMINOPHEN 325 MG PO TABS
650.0000 mg | ORAL_TABLET | ORAL | Status: DC | PRN
  Filled 2018-12-19: qty 2

## 2018-12-19 MED ORDER — BUPIVACAINE HCL (PF) 0.25 % IJ SOLN
INTRAMUSCULAR | Status: AC
  Filled 2018-12-19: qty 60

## 2018-12-19 MED ORDER — SODIUM CHLORIDE 0.9 % IV SOLN
INTRAVENOUS | Status: DC | PRN
  Administered 2018-12-19: 2 ug/min via INTRAVENOUS

## 2018-12-19 MED ORDER — NITROGLYCERIN 0.2 MG/ML ON CALL CATH LAB
INTRAVENOUS | Status: AC
  Filled 2018-12-19: qty 1

## 2018-12-19 MED ORDER — SODIUM CHLORIDE 0.9% FLUSH: 3.0000 mL | INTRAVENOUS | Status: DC | PRN

## 2018-12-19 MED ORDER — BUPIVACAINE HCL (PF) 0.25 % IJ SOLN
INTRAMUSCULAR | Status: DC | PRN
  Administered 2018-12-19: 45 mL

## 2018-12-19 SURGICAL SUPPLY — 13 items
BAG SNAP BAND KOVER 36X36 (MISCELLANEOUS) ×1 IMPLANT
CATH JOSEPH QUAD ALLRED 6F REP (CATHETERS) ×2 IMPLANT
CATH WEBSTER BI DIR CS D-F CRV (CATHETERS) ×1 IMPLANT
DEVICE CLOSURE PERCLS PRGLD 6F (VASCULAR PRODUCTS) IMPLANT
PACK EP LATEX FREE (CUSTOM PROCEDURE TRAY) ×2
PACK EP LF (CUSTOM PROCEDURE TRAY) ×1 IMPLANT
PAD PRO RADIOLUCENT 2001M-C (PAD) ×2 IMPLANT
PATCH CARTO3 (PAD) ×1 IMPLANT
PERCLOSE PROGLIDE 6F (VASCULAR PRODUCTS) ×8
SHEATH PINNACLE 6F 10CM (SHEATH) ×2 IMPLANT
SHEATH PINNACLE 7F 10CM (SHEATH) ×1 IMPLANT
SHEATH PINNACLE 8F 10CM (SHEATH) ×1 IMPLANT
SHEATH PROBE COVER 6X72 (BAG) ×1 IMPLANT

## 2018-12-19 NOTE — Discharge Instructions (Signed)
Cardiac Ablation, Care After This sheet gives you information about how to care for yourself after your procedure. Your health care provider may also give you more specific instructions. If you have problems or questions, contact your health care provider. What can I expect after the procedure? After the procedure, it is common to have:  Bruising around your puncture site.  Tenderness around your puncture site.  Skipped heartbeats.  Tiredness (fatigue). Follow these instructions at home: Puncture site care   Follow instructions from your health care provider about how to take care of your puncture site. Make sure you: ? Wash your hands with soap and water before you change your bandage (dressing). If soap and water are not available, use hand sanitizer. ? Change your dressing as told by your health care provider. ? Leave stitches (sutures), skin glue, or adhesive strips in place. These skin closures may need to stay in place for up to 2 weeks. If adhesive strip edges start to loosen and curl up, you may trim the loose edges. Do not remove adhesive strips completely unless your health care provider tells you to do that.  Check your puncture site every day for signs of infection. Check for: ? Redness, swelling, or pain. ? Fluid or blood. If your puncture site starts to bleed, lie down on your back, apply firm pressure to the area, and contact your health care provider. ? Warmth. ? Pus or a bad smell. Driving  Ask your health care provider when it is safe for you to drive again after the procedure.  Do not drive or use heavy machinery while taking prescription pain medicine.  Do not drive for 24 hours if you were given a medicine to help you relax (sedative) during your procedure. Activity  Avoid activities that take a lot of effort for at least 3 days after your procedure.  Do not lift anything that is heavier than 10 lb (4.5 kg), or the limit that you are told, until your health  care provider says that it is safe.  Return to your normal activities as told by your health care provider. Ask your health care provider what activities are safe for you. General instructions  Take over-the-counter and prescription medicines only as told by your health care provider.  Do not use any products that contain nicotine or tobacco, such as cigarettes and e-cigarettes. If you need help quitting, ask your health care provider.  Do not take baths, swim, or use a hot tub until your health care provider approves.  Do not drink alcohol for 24 hours after your procedure.  Keep all follow-up visits as told by your health care provider. This is important. Contact a health care provider if:  You have redness, mild swelling, or pain around your puncture site.  You have fluid or blood coming from your puncture site that stops after applying firm pressure to the area.  Your puncture site feels warm to the touch.  You have pus or a bad smell coming from your puncture site.  You have a fever.  You have chest pain or discomfort that spreads to your neck, jaw, or arm.  You are sweating a lot.  You feel nauseous.  You have a fast or irregular heartbeat.  You have shortness of breath.  You are dizzy or light-headed and feel the need to lie down.  You have pain or numbness in the arm or leg closest to your puncture site. Get help right away if:  Your puncture  site suddenly swells. °· Your puncture site is bleeding and the bleeding does not stop after applying firm pressure to the area. °These symptoms may represent a serious problem that is an emergency. Do not wait to see if the symptoms will go away. Get medical help right away. Call your local emergency services (911 in the [place] Do not drive yourself to the hospital. °Summary °· After the procedure, it is normal to have bruising and tenderness at the puncture site in your groin, neck, or forearm. °· Check your puncture site every  day for signs of infection. °· Get help right away if your puncture site is bleeding and the bleeding does not stop after applying firm pressure to the area. This is a medical emergency. °This information is not intended to replace advice given to you by your health care provider. Make sure you discuss any questions you have with your health care provider. °Document Released: 05/10/2016 Document Revised: 01/11/2017 Document Reviewed: 05/10/2016 °Elsevier Patient Education © 2020 [employer] Inc[employer] ° °

## 2018-12-19 NOTE — Progress Notes (Signed)
Discharge instructions reviewed with pt and her daughter Alison Garrett Via telephone. Both voice understanding.

## 2018-12-19 NOTE — H&P (Signed)
Alison Garrett has presented today for surgery, with the diagnosis of SVT.  The various methods of treatment have been discussed with the patient and family. After consideration of risks, benefits and other options for treatment, the patient has consented to  Procedure(s): Catheter ablation as a surgical intervention .  Risks include but not limited to bleeding, tamponade, heart block, stroke, damage to surrounding organs, among others. The patient's history has been reviewed, patient examined, no change in status, stable for surgery.  I have reviewed the patient's chart and labs.  Questions were answered to the patient's satisfaction.    Will Curt Bears, MD 12/19/2018 7:14 AM

## 2018-12-19 NOTE — Progress Notes (Addendum)
Dr Curt Bears in to see pt ok to discharge pt ambulate in hallway tol well

## 2018-12-22 ENCOUNTER — Encounter (HOSPITAL_COMMUNITY): Payer: Self-pay | Admitting: Cardiology

## 2019-02-04 ENCOUNTER — Ambulatory Visit: Payer: Medicare HMO | Admitting: Cardiology

## 2019-02-22 ENCOUNTER — Other Ambulatory Visit: Payer: Self-pay

## 2019-02-22 ENCOUNTER — Emergency Department (HOSPITAL_COMMUNITY): Payer: Medicare HMO

## 2019-02-22 ENCOUNTER — Encounter (HOSPITAL_COMMUNITY): Payer: Self-pay | Admitting: Emergency Medicine

## 2019-02-22 ENCOUNTER — Emergency Department (HOSPITAL_COMMUNITY)
Admission: EM | Admit: 2019-02-22 | Discharge: 2019-02-22 | Disposition: A | Discharge status: Home / Self Care | Payer: Medicare HMO | Attending: Emergency Medicine | Admitting: Emergency Medicine

## 2019-02-22 DIAGNOSIS — I10 Essential (primary) hypertension: Secondary | ICD-10-CM | POA: Insufficient documentation

## 2019-02-22 DIAGNOSIS — R202 Paresthesia of skin: Secondary | ICD-10-CM | POA: Diagnosis not present

## 2019-02-22 DIAGNOSIS — R0602 Shortness of breath: Secondary | ICD-10-CM | POA: Insufficient documentation

## 2019-02-22 DIAGNOSIS — Z20822 Contact with and (suspected) exposure to covid-19: Secondary | ICD-10-CM | POA: Diagnosis not present

## 2019-02-22 DIAGNOSIS — Z91040 Latex allergy status: Secondary | ICD-10-CM | POA: Diagnosis not present

## 2019-02-22 DIAGNOSIS — E876 Hypokalemia: Secondary | ICD-10-CM

## 2019-02-22 DIAGNOSIS — R531 Weakness: Secondary | ICD-10-CM | POA: Diagnosis present

## 2019-02-22 LAB — CBC
HCT: 47.8 % — ABNORMAL HIGH (ref 36.0–46.0)
Hemoglobin: 16 g/dL — ABNORMAL HIGH (ref 12.0–15.0)
MCH: 29.4 pg (ref 26.0–34.0)
MCHC: 33.5 g/dL (ref 30.0–36.0)
MCV: 87.9 fL (ref 80.0–100.0)
Platelets: 278 10*3/uL (ref 150–400)
RBC: 5.44 MIL/uL — ABNORMAL HIGH (ref 3.87–5.11)
RDW: 12.7 % (ref 11.5–15.5)
WBC: 7.6 10*3/uL (ref 4.0–10.5)
nRBC: 0 % (ref 0.0–0.2)

## 2019-02-22 LAB — BASIC METABOLIC PANEL
Anion gap: 8 (ref 5–15)
BUN: 10 mg/dL (ref 8–23)
CO2: 32 mmol/L (ref 22–32)
Calcium: 9.5 mg/dL (ref 8.9–10.3)
Chloride: 100 mmol/L (ref 98–111)
Creatinine, Ser: 1.17 mg/dL — ABNORMAL HIGH (ref 0.44–1.00)
GFR calc Af Amer: 54 mL/min — ABNORMAL LOW (ref >60)
GFR calc non Af Amer: 47 mL/min — ABNORMAL LOW (ref >60)
Glucose, Bld: 122 mg/dL — ABNORMAL HIGH (ref 70–99)
Potassium: 3.2 mmol/L — ABNORMAL LOW (ref 3.5–5.1)
Sodium: 140 mmol/L (ref 135–145)

## 2019-02-22 LAB — D-DIMER, QUANTITATIVE: D-Dimer, Quant: 0.67 ug/mL-FEU — ABNORMAL HIGH (ref 0.00–0.50)

## 2019-02-22 LAB — HEPATIC FUNCTION PANEL
ALT: 32 U/L (ref 0–44)
AST: 30 U/L (ref 15–41)
Albumin: 3.4 g/dL — ABNORMAL LOW (ref 3.5–5.0)
Alkaline Phosphatase: 104 U/L (ref 38–126)
Bilirubin, Direct: 0.1 mg/dL (ref 0.0–0.2)
Indirect Bilirubin: 0.9 mg/dL (ref 0.3–0.9)
Total Bilirubin: 1 mg/dL (ref 0.3–1.2)
Total Protein: 7.4 g/dL (ref 6.5–8.1)

## 2019-02-22 LAB — SARS CORONAVIRUS 2 (TAT 6-24 HRS): SARS Coronavirus 2: NEGATIVE

## 2019-02-22 LAB — TROPONIN I (HIGH SENSITIVITY): Troponin I (High Sensitivity): 5 ng/L (ref <18)

## 2019-02-22 MED ORDER — SODIUM CHLORIDE 0.9% FLUSH: 3.0000 mL | Freq: Once | INTRAVENOUS | Status: DC

## 2019-02-22 MED ORDER — POTASSIUM CHLORIDE CRYS ER 20 MEQ PO TBCR: 20.0000 meq | EXTENDED_RELEASE_TABLET | Freq: Every day | ORAL | 0 refills | Status: DC

## 2019-02-22 MED ORDER — POTASSIUM CHLORIDE CRYS ER 20 MEQ PO TBCR
40.0000 meq | EXTENDED_RELEASE_TABLET | Freq: Once | ORAL | Status: AC
  Administered 2019-02-22: 40 meq via ORAL
  Filled 2019-02-22: qty 2

## 2019-02-22 NOTE — ED Notes (Signed)
Pt dc'd home w/all belongings, a/o x4, daughter to pick up from waiting area

## 2019-02-22 NOTE — ED Provider Notes (Signed)
[facility] EMERGENCY DEPARTMENT Provider Note   CSN: [id number] Arrival date & time: 02/22/19  1117     History Chief Complaint  Patient presents with  . Weakness    Alison Garrett is a 74 y.o. female.  HPI      Has had hx of dyspnea over the last week, with exertion. Sometimes when having afib/SVT (had ablation October didn't work however) will have these symptoms and didn't think about it much, however began to have numbness yesterday  Numbness bilateral legs yesterday, arms and face began to feel numb bilaterally today, pinky side of arms and both sides. No neck pain. Numbness bilateral cheeks both sides.    Yesterday watched grandchildren, left wasn't feeling well had some funy feeling in arms and legs, woke up at 2AM and felt numb., felt bad, "yucky", fatigue, mild nausea but not feeling like going to throw up, took only one bite of egg and sausage this AM, low appetite, taste is ok  No diarrhea, no abdominal pain, does note a little bit of chest tightness, but not pain. When feel dyspnea.  Not worse with walking.  No sweating. No headache. Has had lightheadedness.  No palpitations today. Ankles and top part of legs feeling numb right now over shin bones, earlier was all the way around.  No confusion or focal weakness At night arms get numb for years at night, talked to PCP thought it was positional.   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 Afib ([place])   . Complication of anesthesia    propathal   . Dyslipidemia   . Essential tremor 01/12/2014  . Fibromyalgia    Not currently under active treatment  . GERD (gastroesophageal reflux disease)   . Hiatal hernia   . Hypertension   . Obesity     Patient Active Problem List   Diagnosis Date Noted  . History of pulmonary embolism 02/19/2018  . SOB (shortness of  breath) 05/22/2017  . Paresthesias 06/07/2015  . CKD (chronic kidney disease) stage 2, GFR 60-89 ml/min 11/17/2014  . Fatigue 01/12/2014  . Essential tremor 01/12/2014  . Metabolic syndrome [id number]  . Hyperlipemia 09/17/2012  . Vitamin D deficiency 09/17/2012  . Fibromyalgia 09/17/2012  . Paroxysmal A-fib ([place]) 07/11/2011  . Obesity (BMI 30.0-34.9) 12/01/2008  . HYPERTENSION, BENIGN 12/01/2008    Past Surgical History:  Procedure Laterality Date  . ABDOMINAL HYSTERECTOMY    . SVT ABLATION N/A 12/19/2018   Procedure: SVT ABLATION;  Surgeon: Constance Haw, MD;  Location: [facility];  Service: Cardiovascular;  Laterality: N/A;     OB History   No obstetric history on file.     Family History  Problem Relation Age of Onset  . Coronary artery disease Mother   . Heart disease Mother        CABG  . Cancer Mother        trachea  . COPD Father   . Hepatitis Sister        autoimune  . Diabetes Sister     Social History   Tobacco Use  . Smoking status: Never Smoker  . Smokeless tobacco: Never Used  Substance Use Topics  . Alcohol use: No    Alcohol/week: 0.0 standard drinks  . Drug use: No    Home Medications Prior to Admission  medications   Medication Sig Start Date End Date Taking? Authorizing Provider  aspirin EC 81 MG tablet Take 81 mg by mouth daily.    [provider]  hydrochlorothiazide (HYDRODIURIL) 25 MG tablet Take 1 tablet (25 mg total) by mouth daily. 11/20/18 02/18/19  Camnitz, Ocie Doyne, MD    Allergies    Contrast media [iodinated diagnostic agents], Diltiazem, Metoprolol, Penicillins, Sulfonamide derivatives, Benicar [olmesartan], Celebrex [celecoxib], Cymbalta [duloxetine hcl], Latex, Neurontin [gabapentin], Norvasc [amlodipine besylate], Prozac [fluoxetine hcl], Xanax [alprazolam], Zoloft [sertraline hcl], and Zyprexa [olanzapine]  Review of Systems   Review of Systems  Constitutional: Positive for activity change,  appetite change and fatigue.  HENT: Negative for congestion and sore throat.   Eyes: Negative for visual disturbance.  Respiratory: Positive for chest tightness and shortness of breath.   Cardiovascular: Negative for palpitations and leg swelling. Chest pain: denies but notes tightness.  Gastrointestinal: Positive for nausea. Negative for abdominal pain, diarrhea and vomiting.  Genitourinary: Negative for dysuria.  Musculoskeletal: Negative for back pain and neck pain.  Skin: Negative for rash.  Neurological: Positive for light-headedness and numbness. Negative for facial asymmetry, speech difficulty, weakness and headaches.    Physical Exam Updated Vital Signs BP (!) 154/97 (BP Location: Left Arm)   Pulse 78   Temp 98.2 F (36.8 C) (Oral)   Resp 16   SpO2 95%   Physical Exam Vitals and nursing note reviewed.  Constitutional:      General: She is not in acute distress.    Appearance: She is well-developed. She is not diaphoretic.  HENT:     Head: Normocephalic and atraumatic.  Eyes:     Conjunctiva/sclera: Conjunctivae normal.  Cardiovascular:     Rate and Rhythm: Normal rate and regular rhythm.     Pulses: Normal pulses.  Pulmonary:     Effort: Pulmonary effort is normal. No respiratory distress.  Abdominal:     General: There is no distension.     Palpations: Abdomen is soft.     Tenderness: There is no abdominal tenderness. There is no guarding.  Musculoskeletal:        General: No tenderness.     Cervical back: Normal range of motion.  Skin:    General: Skin is warm and dry.     Findings: No erythema or rash.  Neurological:     Mental Status: She is alert and oriented to person, place, and time.     GCS: GCS eye subscore is 4. GCS verbal subscore is 5. GCS motor subscore is 6.     Cranial Nerves: Cranial nerves are intact.     Sensory: Sensation is intact. Sensory deficit: reports she feels things but has tingling, reports tingling worse to anterior shins, ulnar  forearms, lower face bilaterally, does note some symptoms proximal legs as well but less severe.     Motor: Tremor (chronic per pt) present. No weakness or pronator drift.     Coordination: Coordination is intact. Coordination normal. Finger-Nose-Finger Test normal.     Deep Tendon Reflexes:     Reflex Scores:      Patellar reflexes are 2+ on the right side and 2+ on the left side.    ED Results / Procedures / Treatments   Labs (all labs ordered are listed, but only abnormal results are displayed) Labs Reviewed  BASIC METABOLIC PANEL - Abnormal; Notable for the following components:      Result Value   Potassium 3.2 (*)    Glucose, Bld 122 (*)  Creatinine, Ser 1.17 (*)    GFR calc non Af Amer 47 (*)    GFR calc Af Amer 54 (*)    All other components within normal limits  CBC - Abnormal; Notable for the following components:   RBC 5.44 (*)    Hemoglobin 16.0 (*)    HCT 47.8 (*)    All other components within normal limits  URINALYSIS, ROUTINE W REFLEX MICROSCOPIC  D-DIMER, QUANTITATIVE (NOT AT [facility])  HEPATIC FUNCTION PANEL  TROPONIN I (HIGH SENSITIVITY)    EKG EKG Interpretation  Date/Time:  Sunday February 22 2019 11:25:28 EST Ventricular Rate:  81 PR Interval:  178 QRS Duration: 88 QT Interval:  384 QTC Calculation: 446 R Axis:   25 Text Interpretation: Normal sinus rhythm with sinus arrhythmia Nonspecific ST abnormality Abnormal ECG No significant change since last tracing - last ECG with artifact making it difficult to interpret in inferiolateral leads however do not detect significant change Confirmed by Brogen Duell ([id number]) on 02/22/2019 12:03:39 PM   Radiology DG Chest Portable 1 View  Result Date: 02/22/2019 CLINICAL DATA:  Shortness of breath.  Bilateral leg numbness. EXAM: PORTABLE CHEST 1 VIEW COMPARISON:  November 05, 2017 FINDINGS: The heart size and mediastinal contours are within normal limits. Both lungs are clear. The visualized skeletal structures  are unremarkable. IMPRESSION: No active disease. Electronically Signed   By: David  Williams III M.D   On: 02/22/2019 12:53    Procedures Procedures (including critical care time)  Medications Ordered in ED Medications  sodium chloride flush (NS) 0.9 % injection 3 mL (has no administration in time range)  potassium chloride SA (KLOR-CON) CR tablet 40 mEq (has no administration in time range)    ED Course  I have reviewed the triage vital signs and the nursing notes.  Pertinent labs & imaging results that were available during my care of the patient were reviewed by me and considered in my medical decision making (see chart for details).    MDM Rules/Calculators/A&P                      73  year old female with a history of atrial fibrillation, pulmonary embolus no longer on anticoagulation, dyslipidemia, hypertension, essential tremor, fibromyalgia who presents with concern for dyspnea on exertion for 1 week, with numbness of the bilateral extremities and face beginning yesterday and today.  Neurologic exam is normal with the exception of subjective tingling to palpation.  Given distribution of symptoms involving the face, bilateral arms, and bilateral legs, have low suspicion that this represents CVA, ICH, or cervical spine pathology.  More likely toxic/metabolic or other neurological or psychiatric phenomena.  Mild hypokalemia, will give K. No sign of hypocalcemia. She has good strength, reflexes present, low suspicion for GBS at this time.  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   Regarding the dyspnea, and chest tightness. Troponin 5.  DDimer age adjusted is negative, given this have low suspicion for PE.   Recommend wearing gloves when applying frontline given reports  of paresthesias with exposure to this. In addition, it is possible symptoms may be related to hypokalemia. Given rx for K for next 2 days.  Recommend outpatient follow up with Neurology, consider other etiologies as well as PCP. Patient discharged in stable condition with understanding  of reasons to return.    Final Clinical Impression(s) / ED Diagnoses Final diagnoses:  Paresthesias  Shortness of breath    Rx / DC Orders ED Discharge Orders    None       Gareth Morgan, MD 02/22/19 2039

## 2019-02-22 NOTE — ED Triage Notes (Signed)
C/o BP elevated at home, bilateral leg numbness ("from ankles going up legs"), lip numbness, and generalized weakness since yesterday.  No arm drift.

## 2019-02-23 ENCOUNTER — Encounter: Payer: Self-pay | Admitting: Neurology

## 2019-02-23 ENCOUNTER — Telehealth: Payer: Self-pay | Admitting: Family Medicine

## 2019-02-23 NOTE — Telephone Encounter (Signed)
Patient has a follow up appointment scheduled. 

## 2019-02-24 ENCOUNTER — Telehealth: Payer: Self-pay | Admitting: *Deleted

## 2019-02-24 NOTE — Telephone Encounter (Signed)
A message was left, re: hr follow up visit. 

## 2019-02-25 DIAGNOSIS — H2513 Age-related nuclear cataract, bilateral: Secondary | ICD-10-CM | POA: Diagnosis not present

## 2019-02-25 DIAGNOSIS — H43813 Vitreous degeneration, bilateral: Secondary | ICD-10-CM | POA: Diagnosis not present

## 2019-02-25 DIAGNOSIS — H5200 Hypermetropia, unspecified eye: Secondary | ICD-10-CM | POA: Diagnosis not present

## 2019-02-25 DIAGNOSIS — Z0100 Encounter for examination of eyes and vision without abnormal findings: Secondary | ICD-10-CM | POA: Diagnosis not present

## 2019-03-03 ENCOUNTER — Other Ambulatory Visit: Payer: Self-pay

## 2019-03-04 ENCOUNTER — Encounter: Payer: Self-pay | Admitting: Family Medicine

## 2019-03-04 ENCOUNTER — Ambulatory Visit (INDEPENDENT_AMBULATORY_CARE_PROVIDER_SITE_OTHER): Payer: Medicare HMO | Admitting: Family Medicine

## 2019-03-04 VITALS — BP 129/87 | HR 77 | Temp 95.5°F | Ht 69.0 in | Wt 213.4 lb

## 2019-03-04 DIAGNOSIS — R200 Anesthesia of skin: Secondary | ICD-10-CM | POA: Diagnosis not present

## 2019-03-04 DIAGNOSIS — R5383 Other fatigue: Secondary | ICD-10-CM

## 2019-03-04 DIAGNOSIS — R202 Paresthesia of skin: Secondary | ICD-10-CM

## 2019-03-04 DIAGNOSIS — R229 Localized swelling, mass and lump, unspecified: Secondary | ICD-10-CM

## 2019-03-04 NOTE — Progress Notes (Signed)
BP 129/87   Pulse 77   Temp (!) 95.5 F (35.3 C) (Temporal)   Ht _0  (1.753 m)   Wt 213 lb 6.4 oz (96.8 kg)   SpO2 96%   BMI 31.51 kg/m    Subjective:   Patient ID: Alison Garrett, female    DOB: [birth year], 74 y.o.   MRN: [id number]  HPI: MAELEY Garrett is a 74 y.o. female presenting on 03/04/2019 for Hospitalization Follow-up (1/10 [facility]- patient states that she does feel better but it not all the way back to her old self.)   HPI Patient is coming in today for an ER follow-up.  She was in the emergency department on 02/22/2019 for numbness on her legs arm and face that she had been experiencing for 3 to 4 days but then got worse on that day.  Since leaving she has still felt fatigued but the numbness has improved and is almost back to her baseline.  She says she has had episodes like this before that have been evaluated.  She says she also had some cramping in some of her muscles associated with it and then she has the fibromyalgia.  They did set her up for a neurology appointment and she has 1 next month.  Patient also complains of a sore left arm nodule just under her skin that is painful when she touches it or moves it, there is no overlying skin changes or rash, she could not recall how long it has been there   Relevant past medical, surgical, family and social history reviewed and updated as indicated. Interim medical history since our last visit reviewed. Allergies and medications reviewed and updated.  Review of Systems  Constitutional: Positive for fatigue. Negative for chills and fever.  Eyes: Negative for visual disturbance.  Respiratory: Negative for chest tightness and shortness of breath.   Cardiovascular: Negative for chest pain and leg swelling.  Genitourinary: Negative for difficulty urinating and dysuria.  Musculoskeletal: Negative for back pain and gait problem.  Skin: Negative for rash.  Neurological: Positive for numbness. Negative for weakness, light-headedness  and headaches.  Psychiatric/Behavioral: Negative for agitation and behavioral problems.  All other systems reviewed and are negative.   Per HPI unless specifically indicated above      Objective:   BP 129/87   Pulse 77   Temp (!) 95.5 F (35.3 C) (Temporal)   Ht _1  (1.753 m)   Wt 213 lb 6.4 oz (96.8 kg)   SpO2 96%   BMI 31.51 kg/m   Wt Readings from Last 3 Encounters:  03/04/19 213 lb 6.4 oz (96.8 kg)  12/19/18 212 lb (96.2 kg)  11/20/18 213 lb 9.6 oz (96.9 kg)    Physical Exam Vitals and nursing note reviewed.  Constitutional:      General: She is not in acute distress.    Appearance: She is well-developed. She is not diaphoretic.  Eyes:     Conjunctiva/sclera: Conjunctivae normal.  Cardiovascular:     Rate and Rhythm: Normal rate and regular rhythm.     Heart sounds: Normal heart sounds. No murmur.  Pulmonary:     Effort: Pulmonary effort is normal. No respiratory distress.     Breath sounds: Normal breath sounds. No wheezing.  Musculoskeletal:        General: No tenderness. Normal range of motion.  Skin:    General: Skin is warm and dry.     Findings: No rash. Lesion: Very small, 0.2 cm firm nodule,  subcutaneous, no overlying skin changes on her left lateral upper arm.  Neurological:     Mental Status: She is alert and oriented to person, place, and time.     Coordination: Coordination normal.  Psychiatric:        Behavior: Behavior normal.       Assessment & Plan:   Problem List Items Addressed This Visit      Other   Fatigue - Primary (Chronic)   Relevant Orders   CBC with Differential/Platelet   CMP14+EGFR   Thyroid Panel With TSH   Vitamin B12   Folate    Other Visit Diagnoses    Numbness and tingling       Relevant Orders   CBC with Differential/Platelet   CMP14+EGFR   Thyroid Panel With TSH   Vitamin B12   Folate   Subcutaneous nodule       Relevant Orders   Ambulatory referral to Dermatology      Will do blood work hospital  follow-up and she has a neurology follow-up appointment next month  Referral to dermatology for skin lesion Follow up plan: Return if symptoms worsen or fail to improve.  Counseling provided for all of the vaccine components Orders Placed This Encounter  Procedures  . CBC with Differential/Platelet  . CMP14+EGFR  . Thyroid Panel With TSH  . Vitamin B12  . Folate  . Ambulatory referral to Dermatology    Caryl Pina, MD [facility] 03/04/2019, 10:51 AM

## 2019-03-05 LAB — CBC WITH DIFFERENTIAL/PLATELET
Basophils Absolute: 0.1 10*3/uL (ref 0.0–0.2)
Basos: 1 %
EOS (ABSOLUTE): 0.1 10*3/uL (ref 0.0–0.4)
Eos: 1 %
Hematocrit: 46 % (ref 34.0–46.6)
Hemoglobin: 15.5 g/dL (ref 11.1–15.9)
Immature Grans (Abs): 0 10*3/uL (ref 0.0–0.1)
Immature Granulocytes: 0 %
Lymphocytes Absolute: 1.6 10*3/uL (ref 0.7–3.1)
Lymphs: 23 %
MCH: 29.4 pg (ref 26.6–33.0)
MCHC: 33.7 g/dL (ref 31.5–35.7)
MCV: 87 fL (ref 79–97)
Monocytes Absolute: 0.6 10*3/uL (ref 0.1–0.9)
Monocytes: 9 %
Neutrophils Absolute: 4.6 10*3/uL (ref 1.4–7.0)
Neutrophils: 66 %
Platelets: 257 10*3/uL (ref 150–450)
RBC: 5.28 x10E6/uL (ref 3.77–5.28)
RDW: 12.9 % (ref 11.7–15.4)
WBC: 7.1 10*3/uL (ref 3.4–10.8)

## 2019-03-05 LAB — CMP14+EGFR
ALT: 29 IU/L (ref 0–32)
AST: 28 IU/L (ref 0–40)
Albumin/Globulin Ratio: 1.3 (ref 1.2–2.2)
Albumin: 4 g/dL (ref 3.7–4.7)
Alkaline Phosphatase: 128 IU/L — ABNORMAL HIGH (ref 39–117)
BUN/Creatinine Ratio: 14 (ref 12–28)
BUN: 15 mg/dL (ref 8–27)
Bilirubin Total: 0.7 mg/dL (ref 0.0–1.2)
CO2: 28 mmol/L (ref 20–29)
Calcium: 9.7 mg/dL (ref 8.7–10.3)
Chloride: 100 mmol/L (ref 96–106)
Creatinine, Ser: 1.11 mg/dL — ABNORMAL HIGH (ref 0.57–1.00)
GFR calc Af Amer: 57 mL/min/{1.73_m2} — ABNORMAL LOW (ref >59)
GFR calc non Af Amer: 50 mL/min/{1.73_m2} — ABNORMAL LOW (ref >59)
Globulin, Total: 3 g/dL (ref 1.5–4.5)
Glucose: 115 mg/dL — ABNORMAL HIGH (ref 65–99)
Potassium: 4.5 mmol/L (ref 3.5–5.2)
Sodium: 141 mmol/L (ref 134–144)
Total Protein: 7 g/dL (ref 6.0–8.5)

## 2019-03-05 LAB — FOLATE: Folate: 3.8 ng/mL (ref >3.0)

## 2019-03-05 LAB — THYROID PANEL WITH TSH
Free Thyroxine Index: 2.1 (ref 1.2–4.9)
T3 Uptake Ratio: 24 % (ref 24–39)
T4, Total: 8.7 ug/dL (ref 4.5–12.0)
TSH: 1.63 u[IU]/mL (ref 0.450–4.500)

## 2019-03-05 LAB — VITAMIN B12: Vitamin B-12: 261 pg/mL (ref 232–1245)

## 2019-03-17 NOTE — Progress Notes (Signed)
Cardiology Office Note   Date:  03/18/2019   ID:  Alison Garrett, DOB 11/12/1945, MRN [id number]  PCP:  Dettinger, Fransisca Kaufmann, MD  Cardiologist:   Minus Breeding, MD   Chief Complaint  Patient presents with  . Palpitations      History of Present Illness: Alison Garrett is a 74 y.o. female who presents for follow up ofatrial fibrillation. She was in the hospital in Sept206for chest pain and was found to have a small PE. She was sent home on anticoagulation. She wore a ZIO patch and was found to have SVT for 23 seconds. She is intolerant of metoprolol. She has multiple intolerances. She did see Dr. Alyson Reedy was offered an SVT ablation. She wanted to think about it and talk about it with me. At this point it still not entirely clear that her symptoms are related to this SVT.    I tried to use pindolol for management of her symptoms.  However, she only took a couple of days worth of this and then she states she got profoundly depressed.  She had an EP study by Dr. Curt Bears in November.   My reading of this was that there was no inducible arrhythmia.   She was in the ED in early Jan.  I reviewed these records for this visit.  This was for numbness in legs and arm and face.   She does not think she has had any tachypalpitations consistent with her atrial fibrillation.  She does however have rapid heart rate at times when she tried walking so she has been she wants to start back.  She stopped Eliquis on her own.  Past Medical History:  Diagnosis Date  . Afib ([place])   . Complication of anesthesia    propathal   . Dyslipidemia   . Essential tremor 01/12/2014  . Fibromyalgia    Not currently under active treatment  . GERD (gastroesophageal reflux disease)   . Hiatal hernia   . Hypertension   . Obesity     Past Surgical History:  Procedure Laterality Date  . ABDOMINAL HYSTERECTOMY    . SVT ABLATION N/A 12/19/2018   Procedure: SVT ABLATION;  Surgeon: Constance Haw, MD;   Location: [facility];  Service: Cardiovascular;  Laterality: N/A;     Current Outpatient Medications  Medication Sig Dispense Refill  . hydrochlorothiazide (HYDRODIURIL) 25 MG tablet Take 1 tablet (25 mg total) by mouth daily. 90 tablet 3  . apixaban (ELIQUIS) 2.5 MG TABS tablet Take 1 tablet (2.5 mg total) by mouth 2 (two) times daily. 60 tablet 11   No current facility-administered medications for this visit.    Allergies:   Contrast media [iodinated diagnostic agents], Diltiazem, Metoprolol, Penicillins, Sulfonamide derivatives, Benicar [olmesartan], Celebrex [celecoxib], Cymbalta [duloxetine hcl], Latex, Neurontin [gabapentin], Norvasc [amlodipine besylate], Prozac [fluoxetine hcl], Xanax [alprazolam], Zoloft [sertraline hcl], and Zyprexa [olanzapine]   ROS:  Please see the history of present illness.   Otherwise, review of systems are positive for none.   All other systems are reviewed and negative.    PHYSICAL EXAM: VS:  BP (!) 138/100   Pulse 80   Ht 5\' 10"  (1.778 m)   Wt 212 lb (96.2 kg)   BMI 30.42 kg/m  , BMI Body mass index is 30.42 kg/m. GENERAL:  Well appearing NECK:  No jugular venous distention, waveform within normal limits, carotid upstroke brisk and symmetric, no bruits, no thyromegaly LUNGS:  Clear to auscultation bilaterally CHEST:  Unremarkable HEART:  PMI not displaced or sustained,S1 and S2 within normal limits, no S3, no S4, no clicks, no rubs, no murmurs ABD:  Flat, positive bowel sounds normal in frequency in pitch, no bruits, no rebound, no guarding, no midline pulsatile mass, no hepatomegaly, no splenomegaly EXT:  2 plus pulses throughout, no edema, no cyanosis no clubbing   EKG:  EKG is not ordered today. The ekg ordered today demonstrates    Recent Labs: 03/04/2019: ALT 29; BUN 15; Creatinine, Ser 1.11; Hemoglobin 15.5; Platelets 257; Potassium 4.5; Sodium 141; TSH 1.630    Lipid Panel    Component Value Date/Time   CHOL 174 11/05/2018  0908   CHOL 170 06/04/2012 0925   TRIG 153 (H) 11/05/2018 0908   TRIG CANCELED 08/13/2015 0816   TRIG 94 06/04/2012 0925   HDL 39 (L) 11/05/2018 0908   HDL CANCELED 08/13/2015 0816   HDL 41 06/04/2012 0925   CHOLHDL 4.5 (H) 11/05/2018 0908   LDLCALC 108 (H) 11/05/2018 0908   LDLCALC 105 (H) 06/18/2013 0834   LDLCALC 110 (H) 06/04/2012 0925      Wt Readings from Last 3 Encounters:  03/18/19 212 lb (96.2 kg)  03/04/19 213 lb 6.4 oz (96.8 kg)  12/19/18 212 lb (96.2 kg)      Other studies Reviewed: Additional studies/ records that were reviewed today include: ED records. Review of the above records demonstrates:  Please see elsewhere in the note.     ASSESSMENT AND PLAN:  ATRIAL FIB:    [name] has a CHA2DS2 - VASc score of 3 with a risk of stroke of 3.2%.    She does not want to take full dose anticoagulation.  She does agree to take half dose anticoagulation which would be reasonable for prophylaxis given her unprovoked pulmonary embolism in the past.  She understands she is not fully protected from a stroke standpoint with her atrial fibrillation.  Stop ASA  PALPITATIONS: She has an EP study.  . No further testing at this point time.  No change in medicines.  HTN:   At target.  No change in therapy.  PULMONARY EMBOLISM:    As above.  COVID EDUCATION: She does not want to take the vaccine.   Current medicines are reviewed at length with the patient today.  The patient does not have concerns regarding medicines.  The following changes have been made:  no change  Labs/ tests ordered today include: None No orders of the defined types were placed in this encounter.    Disposition:   FU with me in six months.     Signed, Minus Breeding, MD  03/18/2019 4:54 PM    [facility]

## 2019-03-18 ENCOUNTER — Ambulatory Visit: Payer: Medicare HMO | Admitting: Cardiology

## 2019-03-18 ENCOUNTER — Encounter: Payer: Self-pay | Admitting: Cardiology

## 2019-03-18 ENCOUNTER — Other Ambulatory Visit: Payer: Self-pay

## 2019-03-18 VITALS — BP 138/100 | HR 80 | Ht 70.0 in | Wt 212.0 lb

## 2019-03-18 DIAGNOSIS — Z7189 Other specified counseling: Secondary | ICD-10-CM | POA: Diagnosis not present

## 2019-03-18 DIAGNOSIS — I2699 Other pulmonary embolism without acute cor pulmonale: Secondary | ICD-10-CM

## 2019-03-18 DIAGNOSIS — D485 Neoplasm of uncertain behavior of skin: Secondary | ICD-10-CM | POA: Diagnosis not present

## 2019-03-18 DIAGNOSIS — I480 Paroxysmal atrial fibrillation: Secondary | ICD-10-CM

## 2019-03-18 DIAGNOSIS — I10 Essential (primary) hypertension: Secondary | ICD-10-CM | POA: Diagnosis not present

## 2019-03-18 DIAGNOSIS — D1721 Benign lipomatous neoplasm of skin and subcutaneous tissue of right arm: Secondary | ICD-10-CM | POA: Diagnosis not present

## 2019-03-18 DIAGNOSIS — I471 Supraventricular tachycardia: Secondary | ICD-10-CM

## 2019-03-18 DIAGNOSIS — D1722 Benign lipomatous neoplasm of skin and subcutaneous tissue of left arm: Secondary | ICD-10-CM | POA: Diagnosis not present

## 2019-03-18 MED ORDER — APIXABAN 2.5 MG PO TABS: 2.5000 mg | ORAL_TABLET | Freq: Two times a day (BID) | ORAL | 11 refills | Status: DC

## 2019-03-18 NOTE — Patient Instructions (Addendum)
Medication Instructions:  Please start Eliquis 2.5 mg one tablet twice a day. Discontinue your Aspirin. Continue all other medications as listed.  *If you need a refill on your cardiac medications before your next appointment, please call your pharmacy*  Follow-Up: At [facility], you and your health needs are our priority.  As part of our continuing mission to provide you with exceptional heart care, we have created designated Provider Care Teams.  These Care Teams include your primary Cardiologist (physician) and Advanced Practice Providers (APPs -  Physician Assistants and Nurse Practitioners) who all work together to provide you with the care you need, when you need it.  Your next appointment:   12 months  The format for your next appointment:   In Person  Provider:   Minus Breeding, MD  Thank you for choosing [facility]

## 2019-03-23 ENCOUNTER — Other Ambulatory Visit: Payer: Self-pay

## 2019-03-23 ENCOUNTER — Encounter: Payer: Self-pay | Admitting: Neurology

## 2019-03-23 ENCOUNTER — Ambulatory Visit: Payer: Medicare HMO | Admitting: Neurology

## 2019-03-23 VITALS — BP 140/87 | HR 89 | Resp 20 | Ht 70.0 in | Wt 211.0 lb

## 2019-03-23 DIAGNOSIS — R202 Paresthesia of skin: Secondary | ICD-10-CM | POA: Diagnosis not present

## 2019-03-23 NOTE — Progress Notes (Signed)
[facility] Clinic Note - Initial Visit   Date: 03/23/19  Alison Garrett MRN: [id number] DOB: [birth year]   Dear Dr. Warrick Parisian:  Thank you for your kind referral of Alison Garrett for consultation of paresthesias. Although her history is well known to you, please allow [place] to reiterate it for the purpose of our medical record. The patient was accompanied to the clinic by self.     History of Present Illness: Alison Garrett is a 74 y.o. female with fibromyalgia, atrial fibrillation, SVT s/p ablation, and hypertension presenting for evaluation of generalized paresthesias. In early January 2021, she developed whole body numbness/tingling  (face, arms, legs) which progressed over the several days so went to the Emergency Department.  Symptoms self-resolved within a few days.  She has not had whole body paresthesias again.  Every now and then, she can experience numbness and tingling from her elbow down to her fifth finger, this usually occurs upon awakening and resolves within a few minutes.  She denies any weakness.  She has similar symptoms in 2015 and electrodiagnostic testing was normal, specifically no evidence of neuropathy.  Symptoms were felt to be secondary to underlying fibromyalgia   She works with the [employer] and lives alone.  She endorses being stressed most of the time.     Out-side paper records, electronic medical record, and images have been reviewed where available and summarized as: Lab Results  Component Value Date   HGBA1C 5.7 11/05/2018   Lab Results  Component Value Date   [id number] 03/04/2019   Lab Results  Component Value Date   TSH 1.630 03/04/2019   Lab Results  Component Value Date   ESRSEDRATE 5 01/12/2014    Past Medical History:  Diagnosis Date  . Afib ([place])   . Complication of anesthesia    propathal   . Dyslipidemia   . Essential tremor 01/12/2014  . Fibromyalgia    Not currently under active treatment  .  GERD (gastroesophageal reflux disease)   . Hiatal hernia   . Hypertension   . Obesity     Past Surgical History:  Procedure Laterality Date  . ABDOMINAL HYSTERECTOMY    . SVT ABLATION N/A 12/19/2018   Procedure: SVT ABLATION;  Surgeon: Constance Haw, MD;  Location: [facility];  Service: Cardiovascular;  Laterality: N/A;     Medications:  Outpatient Encounter Medications as of 03/23/2019  Medication Sig  . aspirin 81 MG EC tablet Take 81 mg by mouth daily. Swallow whole.  . hydrochlorothiazide (HYDRODIURIL) 25 MG tablet Take 1 tablet (25 mg total) by mouth daily.  Marland Kitchen apixaban (ELIQUIS) 2.5 MG TABS tablet Take 1 tablet (2.5 mg total) by mouth 2 (two) times daily. (Patient not taking: Reported on 03/23/2019)   No facility-administered encounter medications on file as of 03/23/2019.     Family History: Family History  Problem Relation Age of Onset  . Coronary artery disease Mother   . Heart disease Mother        CABG  . Cancer Mother        trachea  . COPD Father   . Hepatitis Sister        autoimune  . Diabetes Sister     Social History: Social History   Tobacco Use  . Smoking status: Never Smoker  . Smokeless tobacco: Never Used  Substance Use Topics  . Alcohol use: No    Alcohol/week: 0.0 standard drinks  . Drug use: No  Social History   Social History Narrative   Lives in [place].   Patient is right handed   Drinks very little caffeine.    Vital Signs:  BP 140/87   Pulse 89   Resp 20   Ht 5\' 10"  (1.778 m)   Wt 211 lb (95.7 kg)   SpO2 95%   BMI 30.28 kg/m    General Medical Exam:   General:  Well appearing, comfortable.   Eyes/ENT: see cranial nerve examination.   Neck:   No carotid bruits. Respiratory:  Clear to auscultation, good air entry bilaterally.   Cardiac:  Regular rate and rhythm, no murmur.   Extremities:  No deformities, edema, or skin discoloration.  Skin:  No rashes or lesions.  Neurological Exam: MENTAL STATUS  including orientation to time, place, person, recent and remote memory, attention span and concentration, language, and fund of knowledge is normal.  Speech is not dysarthric.  CRANIAL NERVES: II:  No visual field defects.  III-IV-VI: Pupils equal round.  Normal conjugate, extra-ocular eye movements in all directions of gaze.  No nystagmus.  No ptosis.   V:  Normal facial sensation.    VII:  Normal facial symmetry and movements.   VIII:  Normal hearing and vestibular function.   IX-X:  Normal palatal movement.   XI:  Normal shoulder shrug and head rotation.   XII:  Tongue is midlne  MOTOR:  No atrophy, fasciculations.  Mild head tremor.  No pronator drift.   Upper Extremity:  Right  Left  Deltoid  5/5   5/5   Biceps  5/5   5/5   Triceps  5/5   5/5   Infraspinatus 5/5  5/5  Medial pectoralis 5/5  5/5  Wrist extensors  5/5   5/5   Wrist flexors  5/5   5/5   Finger extensors  5/5   5/5   Finger flexors  5/5   5/5   Dorsal interossei  5/5   5/5   Abductor pollicis  5/5   5/5   Tone (Ashworth scale)  0  0   Lower Extremity:  Right  Left  Hip flexors  5/5   5/5   Hip extensors  5/5   5/5   Adductor 5/5  5/5  Abductor 5/5  5/5  Knee flexors  5/5   5/5   Knee extensors  5/5   5/5   Dorsiflexors  5/5   5/5   Plantarflexors  5/5   5/5   Toe extensors  5/5   5/5   Toe flexors  5/5   5/5   Tone (Ashworth scale)  0  0   MSRs:  Right        Left                  brachioradialis 2+  2+  biceps 2+  2+  triceps 2+  2+  patellar 2+  2+  ankle jerk 2+  2+  Hoffman no  no  plantar response down  down   SENSORY:  Normal and symmetric perception of light touch, pinprick, vibration, and proprioception.  Romberg's sign absent.   COORDINATION/GAIT: Normal finger-to- nose-finger.  Intact rapid alternating movements bilaterally. Gait narrow based and stable. Tandem and stressed gait intact.    IMPRESSION: 1.  Nonspecific whole-body paresthesias, resolved.  Possibly contributed by stress  reaction, fibromyalgia, or low-normal vitamin B12.  Normal neurological exam makes neuropathy unlikely.  Prior NCS/EMG from 2015 was reveiwed and is  normal.  Start vitamin B12 1054mcg daily  2.  Bilateral ulnar neuropathy causing transient forearm and 5th finger numbness.  Avoid compression and stretching of the nerve across the elbow.  Monitor.   Thank you for allowing me to participate in patient's care.  If I can answer any additional questions, I would be pleased to do so.    Sincerely,    Amadi Frady K. Posey Pronto, DO

## 2019-03-24 ENCOUNTER — Ambulatory Visit: Payer: Medicare HMO | Admitting: Cardiology

## 2019-03-24 ENCOUNTER — Encounter: Payer: Self-pay | Admitting: Cardiology

## 2019-03-24 VITALS — BP 120/84 | HR 84 | Ht 70.0 in | Wt 211.4 lb

## 2019-03-24 DIAGNOSIS — Z1231 Encounter for screening mammogram for malignant neoplasm of breast: Secondary | ICD-10-CM | POA: Diagnosis not present

## 2019-03-24 DIAGNOSIS — I471 Supraventricular tachycardia: Secondary | ICD-10-CM

## 2019-03-24 MED ORDER — VERAPAMIL HCL ER 120 MG PO TBCR: 120.0000 mg | EXTENDED_RELEASE_TABLET | Freq: Every day | ORAL | 11 refills | Status: DC

## 2019-03-24 NOTE — Progress Notes (Signed)
Electrophysiology Office Note   Date:  03/24/2019   ID:  Alison Garrett, DOB 1945-11-15, MRN [id number]  PCP:  Alison Garrett, Alison Kaufmann, MD  Cardiologist:  [facility] Primary Electrophysiologist:  Korra Christine Meredith Leeds, MD    No chief complaint on file.    History of Present Illness: Alison Garrett is a 74 y.o. female who is being seen today for the evaluation of atrial fibrillation, SVT at the request of Alison Garrett, Alison Kaufmann, MD. Presenting today for electrophysiology evaluation.  She has a history of hypertension and hyperlipidemia.  She presented to the hospital September 2019 with chest pain and was found to have a small PE.  At follow-up she was noted to have palpitations and wore a ZIO patch that showed episodes of SVT for up to 23 seconds.  She was started on metoprolol, though she did not tolerate this.  She has been getting palpitations doing activities, though she does not feel like this is due to her atrial fibrillation.  Her symptoms occur most days.  They occur mainly when she is exerting herself.  She says that her heart rate gets up to 110 bpm which makes her feel poorly.  She did wear a cardiac monitor that showed symptoms at times associated with SVT and at times associated with normal rhythm.  She had a negative EP study 12/19/2018.  Today, denies symptoms of chest pain, shortness of breath, orthopnea, PND, lower extremity edema, claudication, dizziness, presyncope, syncope, bleeding, or neurologic sequela. The patient is tolerating medications without difficulties.  Unfortunately, she is continued to have episodic palpitations.  Her palpitations occur most days, but are short-lived.  She has no chest pain or shortness of breath.   Past Medical History:  Diagnosis Date  . Afib ([place])   . Complication of anesthesia    propathal   . Dyslipidemia   . Essential tremor 01/12/2014  . Fibromyalgia    Not currently under active treatment  . GERD (gastroesophageal reflux disease)   . Hiatal  hernia   . Hypertension   . Obesity    Past Surgical History:  Procedure Laterality Date  . ABDOMINAL HYSTERECTOMY    . SVT ABLATION N/A 12/19/2018   Procedure: SVT ABLATION;  Surgeon: Constance Haw, MD;  Location: [facility];  Service: Cardiovascular;  Laterality: N/A;     Current Outpatient Medications  Medication Sig Dispense Refill  . apixaban (ELIQUIS) 2.5 MG TABS tablet Take 1 tablet (2.5 mg total) by mouth 2 (two) times daily. 60 tablet 11  . aspirin 81 MG EC tablet Take 81 mg by mouth daily. Swallow whole.    . hydrochlorothiazide (HYDRODIURIL) 25 MG tablet Take 1 tablet (25 mg total) by mouth daily. 90 tablet 3   No current facility-administered medications for this visit.    Allergies:   Contrast media [iodinated diagnostic agents], Diltiazem, Metoprolol, Penicillins, Sulfonamide derivatives, Benicar [olmesartan], Celebrex [celecoxib], Cymbalta [duloxetine hcl], Latex, Neurontin [gabapentin], Norvasc [amlodipine besylate], Prozac [fluoxetine hcl], Xanax [alprazolam], Zoloft [sertraline hcl], and Zyprexa [olanzapine]   Social History:  The patient  reports that she has never smoked. She has never used smokeless tobacco. She reports that she does not drink alcohol or use drugs.   Family History:  The patient's family history includes COPD in her father; Cancer in her mother; Coronary artery disease in her mother; Diabetes in her sister; Heart disease in her mother; Hepatitis in her sister.    ROS:  Please see the history of present illness.  Otherwise, review of systems is positive for none.   All other systems are reviewed and negative.   PHYSICAL EXAM: VS:  BP 120/84   Pulse 84   Ht 5\' 10"  (1.778 m)   Wt 211 lb 6.4 oz (95.9 kg)   SpO2 95%   BMI 30.33 kg/m  , BMI Body mass index is 30.33 kg/m. GEN: Well nourished, well developed, in no acute distress  HEENT: normal  Neck: no JVD, carotid bruits, or masses Cardiac: RRR; no murmurs, rubs, or gallops,no  edema  Respiratory:  clear to auscultation bilaterally, normal work of breathing GI: soft, nontender, nondistended, + BS MS: no deformity or atrophy  Skin: warm and dry Neuro:  Strength and sensation are intact Psych: euthymic mood, full affect  EKG:  EKG is not ordered today. Personal review of the ekg ordered 02/24/19 shows SR, rate 81   Recent Labs: 03/04/2019: ALT 29; BUN 15; Creatinine, Ser 1.11; Hemoglobin 15.5; Platelets 257; Potassium 4.5; Sodium 141; TSH 1.630    Lipid Panel     Component Value Date/Time   CHOL 174 11/05/2018 0908   CHOL 170 06/04/2012 0925   TRIG 153 (H) 11/05/2018 0908   TRIG CANCELED 08/13/2015 0816   TRIG 94 06/04/2012 0925   HDL 39 (L) 11/05/2018 0908   HDL CANCELED 08/13/2015 0816   HDL 41 06/04/2012 0925   CHOLHDL 4.5 (H) 11/05/2018 0908   LDLCALC 108 (H) 11/05/2018 0908   LDLCALC 105 (H) 06/18/2013 0834   LDLCALC 110 (H) 06/04/2012 0925     Wt Readings from Last 3 Encounters:  03/24/19 211 lb 6.4 oz (95.9 kg)  03/23/19 211 lb (95.7 kg)  03/18/19 212 lb (96.2 kg)      Other studies Reviewed: Additional studies/ records that were reviewed today include: Monitor 08/05/2018 personally reviewed Review of the above records today demonstrates:  NSR Brief runs of SVT The longest run was 142 bpm.  ASSESSMENT AND PLAN:  1.  Paroxysmal atrial fibrillation: Currently on Eliquis, though she is on low-dose.  She understands her risks of stroke as per Dr. Rosezella Florida note.  CHA2DS2-VASc of 3.     2.  SVT: Unfortunately had a negative EP study on 12/19/2018.  She is continued to have palpitations.  We Maeby Vankleeck start verapamil, 120 mg daily.  She has had intolerances to multiple medications but she has not yet tried verapamil.  3.  Hypertension: Currently well controlled  4.  Pulmonary embolism: Continue Eliquis   Current medicines are reviewed at length with the patient today.   The patient does not have concerns regarding her medicines.  The  following changes were made today: Start verapamil  Labs/ tests ordered today include:  No orders of the defined types were placed in this encounter.   Disposition:   FU with Keiandre Cygan 6 months  Signed, Agam Davenport Meredith Leeds, MD  03/24/2019 10:57 AM     [facility] [address] [place] [address] [phone] (office) [phone] (fax)

## 2019-03-24 NOTE — Patient Instructions (Addendum)
Medication Instructions:  Your physician has recommended you make the following change in your medication:  1. START Verapamil 120 mg daily at bedtime.  *If you need a refill on your cardiac medications before your next appointment, please call your pharmacy*  Lab Work: None ordered If you have labs (blood work) drawn today and your tests are completely normal, you will receive your results only by: Marland Kitchen MyChart Message (if you have MyChart) OR . A paper copy in the mail If you have any lab test that is abnormal or we need to change your treatment, we will call you to review the results.  Testing/Procedures: None ordered  Follow-Up: At [facility], you and your health needs are our priority.  As part of our continuing mission to provide you with exceptional heart care, we have created designated Provider Care Teams.  These Care Teams include your primary Cardiologist (physician) and Advanced Practice Providers (APPs -  Physician Assistants and Nurse Practitioners) who all work together to provide you with the care you need, when you need it.  Your next appointment:   6 month(s)  The format for your next appointment:   In Person  Provider:   Allegra Lai, MD  Thank you for choosing [facility]!!   Trinidad Curet, RN [phone]

## 2019-03-26 DIAGNOSIS — D1722 Benign lipomatous neoplasm of skin and subcutaneous tissue of left arm: Secondary | ICD-10-CM | POA: Diagnosis not present

## 2019-04-08 DIAGNOSIS — D1801 Hemangioma of skin and subcutaneous tissue: Secondary | ICD-10-CM | POA: Diagnosis not present

## 2019-04-08 DIAGNOSIS — L648 Other androgenic alopecia: Secondary | ICD-10-CM | POA: Diagnosis not present

## 2019-04-23 ENCOUNTER — Ambulatory Visit (INDEPENDENT_AMBULATORY_CARE_PROVIDER_SITE_OTHER): Payer: Medicare HMO | Admitting: *Deleted

## 2019-04-23 DIAGNOSIS — I10 Essential (primary) hypertension: Secondary | ICD-10-CM

## 2019-04-23 DIAGNOSIS — I471 Supraventricular tachycardia: Secondary | ICD-10-CM

## 2019-04-23 DIAGNOSIS — I480 Paroxysmal atrial fibrillation: Secondary | ICD-10-CM

## 2019-04-23 NOTE — Chronic Care Management (AMB) (Signed)
Chronic Care Management   Follow Up Note   04/23/2019 Name: Alison Garrett MRN: [id number] DOB: [birth year]  Referred by: Dettinger, Fransisca Kaufmann, MD Reason for referral : Chronic Care Management (RN follow up)   Alison Garrett is a 74 y.o. year old female who is a primary care patient of Dettinger, Fransisca Kaufmann, MD. The CCM team was consulted for assistance with chronic disease management and care coordination needs.    Review of patient status, including review of consultants reports, relevant laboratory and other test results, and collaboration with appropriate care team members and the patient's provider was performed as part of comprehensive patient evaluation and provision of chronic care management services.    SDOH (Social Determinants of Health) assessments performed: No See Care Plan activities for detailed interventions related to Alison Garrett)    I spoke with Alison Garrett by telephone today. She is very much in tune with her medical needs and is very independent with managing her daily life. Over the past month she has seen a neurologist for paresthesias, Dr Curt Bears, cardiologist, for SVT, and Dr Percival Spanish, cardiologist, for routine follow-up on Afib.   Current Outpatient Medications  Medication Instructions  . apixaban (ELIQUIS) 2.5 mg, Oral, 2 times daily  . aspirin 81 mg, Oral, Daily, Swallow whole.   . hydrochlorothiazide (HYDRODIURIL) 25 mg, Oral, Daily  . verapamil (CALAN-SR) 120 mg, Oral, Daily at bedtime NOT TAKING  . vitamin B-12 (CYANOCOBALAMIN) 1,000 mcg, Oral, Daily     RN Care Plan   . "I want to keep my blood pressure under control"        Current Barriers:  . Chronic Disease Management support and education needs related to hypertension  Nurse Case Manager Clinical Goal(s):  Marland Kitchen Over the next 90 days, patient will demonstrate improved adherence to prescribed treatment plan for hypertension as evidenced bynormal home blood pressure readings  Interventions:  . Evaluation of  current treatment plan related to hypertension and patient's adherence to plan as established by provider. . Reviewed medications with patient and discussed HCTZ and intolerance to other antihypertensives . Discussed plans with patient for ongoing care management follow up and provided patient with direct contact information for care management team . Previously advised patient, providing education and rationale, to monitor blood pressure daily and record, calling [phone] for findings outside established parameters.  . Encouraged to keep regular follow-up appointments with PCP and cardiologist  Patient Self Care Activities:  . Performs ADL's independently . Performs IADL's independently  Please see past updates related to this goal by clicking on the "Past Updates" button in the selected goal      . "I would like for SVT to resolve" (pt-stated)       Current Barriers:  . Chronic Disease Management support and education needs related to SVT management and treatment  Nurse Case Manager Clinical Goal(s):  Marland Kitchen Over the next 90 days, patient will report any episodes of SVT to her cardiologist  Interventions:  . Chart reviewed including recent office notes and procedure notes . Medications reviewed and discussed verapamil with patient o She took 1/2 of a 120mg  tablet and developed chest pain and shortness of breath. She did not take any additional doses o Dr Curt Bears recommended a 6 month f/u but patient prefers to just see Dr Percival Spanish for her cardiac care. She is supposed to see him in 6 months as well. o Reports that pulse stays around 80. No complaints at this time.  . Previously provided education to  patient re: SVT self management with valsalva maneuver  . Provided education to patient re: when to call 911, cardiology, or RNCM . Provided with CCM contact information and encouraged to reach out as needed . Advised to reach out to Dr Percival Spanish with any cardiac concerns or episodes of  SVT  Patient Self Care Activities:  . Performs ADL's independently . Performs IADL's independently  Please see past updates related to this goal by clicking on the "Past Updates" button in the selected goal          Follow-up Plan:   The care management team will reach out to the patient again over the next 90 days.   Chong Sicilian, BSN, RN-BC Embedded Chronic Care Manager [facility] Management Direct Dial: [phone]

## 2019-04-23 NOTE — Patient Instructions (Signed)
Visit Information  Goals Addressed            This Visit's Progress     Patient Stated   . "I want to keep my blood pressure under control" (pt-stated)       Current Barriers:  . Chronic Disease Management support and education needs related to hypertension  Nurse Case Manager Clinical Goal(s):  Marland Kitchen Over the next 90 days, patient will demonstrate improved adherence to prescribed treatment plan for hypertension as evidenced bynormal home blood pressure readings  Interventions:  . Evaluation of current treatment plan related to hypertension and patient's adherence to plan as established by provider. . Reviewed medications with patient and discussed HCTZ and intolerance to other antihypertensives . Discussed plans with patient for ongoing care management follow up and provided patient with direct contact information for care management team . Previously advised patient, providing education and rationale, to monitor blood pressure daily and record, calling [phone] for findings outside established parameters.  . Encouraged to keep regular follow-up appointments with PCP and cardiologist  Patient Self Care Activities:  . Performs ADL's independently . Performs IADL's independently  Please see past updates related to this goal by clicking on the "Past Updates" button in the selected goal      . "I would like for SVT to resolve" (pt-stated)       Current Barriers:  . Chronic Disease Management support and education needs related to SVT management and treatment  Nurse Case Manager Clinical Goal(s):  Marland Kitchen Over the next 90 days, patient will report any episodes of SVT to her cardiologist  Interventions:  . Chart reviewed including recent office notes and procedure notes . Medications reviewed and discussed verapamil with patient o She took 1/2 of a 120mg  tablet and developed chest pain and shortness of breath. She did not take any additional doses o Dr Curt Bears recommended a 6 month f/u  but patient prefers to just see Dr Percival Spanish for her cardiac care. She is supposed to see him in 6 months as well. o Reports that pulse stays around 80. No complaints at this time.  . Previously provided education to patient re: SVT self management with valsalva maneuver  . Provided education to patient re: when to call 911, cardiology, or RNCM . Provided with CCM contact information and encouraged to reach out as needed . Advised to reach out to Dr Percival Spanish with any cardiac concerns or episodes of SVT  Patient Self Care Activities:  . Performs ADL's independently . Performs IADL's independently  Please see past updates related to this goal by clicking on the "Past Updates" button in the selected goal         The patient verbalized understanding of instructions provided today and declined a print copy of patient instruction materials.   Follow-up Plan The care management team will reach out to the patient again over the next 90 days.   Chong Sicilian, BSN, RN-BC Embedded Chronic Care Manager [facility] [place] [facility] Direct Dial: [phone]

## 2019-06-23 DIAGNOSIS — B351 Tinea unguium: Secondary | ICD-10-CM | POA: Diagnosis not present

## 2019-07-24 ENCOUNTER — Ambulatory Visit: Payer: Medicare HMO | Admitting: *Deleted

## 2019-07-24 DIAGNOSIS — I10 Essential (primary) hypertension: Secondary | ICD-10-CM

## 2019-08-06 DIAGNOSIS — L820 Inflamed seborrheic keratosis: Secondary | ICD-10-CM | POA: Diagnosis not present

## 2019-08-06 DIAGNOSIS — R208 Other disturbances of skin sensation: Secondary | ICD-10-CM | POA: Diagnosis not present

## 2019-08-10 NOTE — Chronic Care Management (AMB) (Signed)
°  Chronic Care Management   Follow-up Outreach Note  08/10/2019 Name: Alison Garrett MRN: [id number] DOB: September 29, 1945  Referred by: Dettinger, Fransisca Kaufmann, MD Reason for referral : Chronic Care Management (RN follow up)   An unsuccessful telephone follow-up was attempted today. The patient was referred to the case management team for assistance with care management and care coordination.   Follow Up Plan: The care management team will reach out to the patient again over the next 45 days.   Chong Sicilian, BSN, RN-BC Embedded Chronic Care Manager [facility] [place] Direct Dial: [phone]

## 2019-10-14 ENCOUNTER — Other Ambulatory Visit: Payer: Self-pay

## 2019-10-14 ENCOUNTER — Encounter: Payer: Self-pay | Admitting: Family Medicine

## 2019-10-14 ENCOUNTER — Ambulatory Visit (INDEPENDENT_AMBULATORY_CARE_PROVIDER_SITE_OTHER): Payer: Medicare HMO | Admitting: Family Medicine

## 2019-10-14 VITALS — BP 136/88 | HR 82 | Temp 98.0°F | Ht 70.0 in | Wt 206.5 lb

## 2019-10-14 DIAGNOSIS — G4452 New daily persistent headache (NDPH): Secondary | ICD-10-CM

## 2019-10-14 DIAGNOSIS — J3489 Other specified disorders of nose and nasal sinuses: Secondary | ICD-10-CM | POA: Diagnosis not present

## 2019-10-14 MED ORDER — PREDNISONE 20 MG PO TABS: ORAL_TABLET | ORAL | 0 refills | Status: DC

## 2019-10-14 NOTE — Progress Notes (Signed)
BP 136/88   Pulse 82   Temp 98 F (36.7 C)   Ht 5\' 10"  (1.778 m)   Wt 206 lb 8 oz (93.7 kg)   BMI 29.63 kg/m    Subjective:   Patient ID: Alison Garrett, female    DOB: 04/08/1945, 74 y.o.   MRN: [id number]  HPI: Alison Garrett is a 74 y.o. female presenting on 10/14/2019 for Headache   HPI Patient is coming in complaining of headache or pressure in her head that is been going on over the past 3 weeks.  She says is been every day but not all the time, will come and go, does feel like a pressure on the top of her head and in the front of her head but she does admit to having some sinus pressure and congestion but is not always associated with it.  She denies any sensitivity to light or sound.  She denies any vision changes or hearing changes or changes in taste or smell.  She denies any fevers or chills.  She denies any urinary or bowel issues.  She says sometimes occasionally she will feel dizzy more when she turns around quickly but not all the time and that is been rare.  She has been checking her blood pressure to make sure is not elevated and it has not been, and it is 136/88 today here in the office.  Relevant past medical, surgical, family and social history reviewed and updated as indicated. Interim medical history since our last visit reviewed. Allergies and medications reviewed and updated.  Review of Systems  Constitutional: Negative for chills and fever.  HENT: Positive for sinus pressure. Negative for congestion, sinus pain, sneezing and sore throat.   Eyes: Negative for visual disturbance.  Respiratory: Negative for cough, chest tightness and shortness of breath.   Cardiovascular: Negative for chest pain and leg swelling.  Musculoskeletal: Negative for back pain and gait problem.  Skin: Negative for rash.  Neurological: Positive for headaches. Negative for weakness and light-headedness.  Psychiatric/Behavioral: Negative for agitation and behavioral problems.  All other  systems reviewed and are negative.   Per HPI unless specifically indicated above      Objective:   BP 136/88   Pulse 82   Temp 98 F (36.7 C)   Ht 5\' 10"  (1.778 m)   Wt 206 lb 8 oz (93.7 kg)   BMI 29.63 kg/m   Wt Readings from Last 3 Encounters:  10/14/19 206 lb 8 oz (93.7 kg)  03/24/19 211 lb 6.4 oz (95.9 kg)  03/23/19 211 lb (95.7 kg)    Physical Exam Vitals and nursing note reviewed.  Constitutional:      General: She is not in acute distress.    Appearance: She is well-developed. She is not diaphoretic.  Eyes:     Conjunctiva/sclera: Conjunctivae normal.     Pupils: Pupils are equal, round, and reactive to light.  Cardiovascular:     Rate and Rhythm: Normal rate and regular rhythm.     Heart sounds: Normal heart sounds. No murmur heard.   Pulmonary:     Effort: Pulmonary effort is normal. No respiratory distress.     Breath sounds: Normal breath sounds. No wheezing.  Musculoskeletal:        General: No tenderness. Normal range of motion.  Skin:    General: Skin is warm and dry.     Findings: No rash.  Neurological:     Mental Status: She is alert and  oriented to person, place, and time.     Cranial Nerves: Cranial nerves are intact.     Sensory: Sensation is intact.     Motor: Motor function is intact.     Coordination: Coordination is intact. Coordination normal.     Gait: Gait is intact.  Psychiatric:        Behavior: Behavior normal.       Assessment & Plan:   Problem List Items Addressed This Visit    None    Visit Diagnoses    Sinus pressure    -  Primary   Relevant Medications   predniSONE (DELTASONE) 20 MG tablet   New daily persistent headache       Relevant Medications   predniSONE (DELTASONE) 20 MG tablet      Based on symptoms, sounds more like sinus pressure, told her we will do some anti-inflammatories for a week and see if we can reduce that, if it does not improve then we may need to go see a neurologist at that point. Follow  up plan: Return if symptoms worsen or fail to improve.  Counseling provided for all of the vaccine components No orders of the defined types were placed in this encounter.   Caryl Pina, MD [facility] 10/14/2019, 9:59 AM

## 2020-02-01 ENCOUNTER — Other Ambulatory Visit: Payer: Self-pay | Admitting: Cardiology

## 2020-03-23 ENCOUNTER — Ambulatory Visit (INDEPENDENT_AMBULATORY_CARE_PROVIDER_SITE_OTHER): Payer: Medicare HMO | Admitting: Nurse Practitioner

## 2020-03-23 ENCOUNTER — Encounter: Payer: Self-pay | Admitting: Nurse Practitioner

## 2020-03-23 ENCOUNTER — Other Ambulatory Visit: Payer: Self-pay

## 2020-03-23 DIAGNOSIS — R3915 Urgency of urination: Secondary | ICD-10-CM | POA: Diagnosis not present

## 2020-03-23 LAB — URINALYSIS, COMPLETE
Bilirubin, UA: NEGATIVE
Glucose, UA: NEGATIVE
Ketones, UA: NEGATIVE
Nitrite, UA: NEGATIVE
Protein,UA: NEGATIVE
RBC, UA: NEGATIVE
Specific Gravity, UA: 1.01 (ref 1.005–1.030)
Urobilinogen, Ur: 0.2 mg/dL (ref 0.2–1.0)
pH, UA: 6 (ref 5.0–7.5)

## 2020-03-23 LAB — MICROSCOPIC EXAMINATION
Epithelial Cells (non renal): NONE SEEN /hpf (ref 0–10)
RBC, Urine: NONE SEEN /hpf (ref 0–2)

## 2020-03-23 MED ORDER — CIPROFLOXACIN HCL 500 MG PO TABS: 500.0000 mg | ORAL_TABLET | Freq: Two times a day (BID) | ORAL | 0 refills | Status: DC

## 2020-03-23 NOTE — Progress Notes (Signed)
   Virtual Visit via telephone Note Due to COVID-19 pandemic this visit was conducted virtually. This visit type was conducted due to national recommendations for restrictions regarding the COVID-19 Pandemic (e.g. social distancing, sheltering in place) in an effort to limit this patient's exposure and mitigate transmission in our community. All issues noted in this document were discussed and addressed.  A physical exam was not performed with this format.  I connected with Alison Garrett on 03/23/20 at  9:45 PM by telephone and verified that I am speaking with the correct person using two identifiers. Alison Garrett is currently located at home during visit. The provider, Ivy Lynn, NP is located in their office at time of visit.  I discussed the limitations, risks, security and privacy concerns of performing an evaluation and management service by telephone and the availability of in person appointments. I also discussed with the patient that there may be a patient responsible charge related to this service. The patient expressed understanding and agreed to proceed.   History and Present Illness:  Urinary Tract Infection  This is a new problem. Episode onset: In the past few days. The problem occurs every urination. The problem has been gradually worsening. The quality of the pain is described as aching. The pain is moderate. There has been no fever. Associated symptoms include flank pain and frequency. Pertinent negatives include no chills or nausea. The treatment provided no relief. There is no history of kidney stones.      Review of Systems  Constitutional: Negative for chills.  Gastrointestinal: Negative for nausea.  Genitourinary: Positive for flank pain and frequency.     Observations/Objective: Televisit.  Patient did not sound to be in distress. Assessment and Plan: Urgency of urination Started patient on Cipro for uncontrolled urinary tract infection symptoms.  Completed  urinalysis and culture with results pending.  Will reassess treatment after cultures completed. Advised patient to increase hydration. Rx sent to pharmacy. Follow-up with worsening or unresolved symptoms   Follow Up Instructions: Follow-up with worsening or unresolved symptoms    I discussed the assessment and treatment plan with the patient. The patient was provided an opportunity to ask questions and all were answered. The patient agreed with the plan and demonstrated an understanding of the instructions.   The patient was advised to call back or seek an in-person evaluation if the symptoms worsen or if the condition fails to improve as anticipated.  The above assessment and management plan was discussed with the patient. The patient verbalized understanding of and has agreed to the management plan. Patient is aware to call the clinic if symptoms persist or worsen. Patient is aware when to return to the clinic for a follow-up visit. Patient educated on when it is appropriate to go to the emergency department.   Time call ended: 9:52 AM  I provided 7 minutes of non-face-to-face time during this encounter.    Ivy Lynn, NP

## 2020-03-23 NOTE — Assessment & Plan Note (Signed)
Started patient on Cipro for uncontrolled urinary tract infection symptoms.  Completed urinalysis and culture with results pending.  Will reassess treatment after cultures completed. Advised patient to increase hydration. Rx sent to pharmacy. Follow-up with worsening or unresolved symptoms

## 2020-03-24 DIAGNOSIS — R631 Polydipsia: Secondary | ICD-10-CM | POA: Diagnosis not present

## 2020-03-24 DIAGNOSIS — N3000 Acute cystitis without hematuria: Secondary | ICD-10-CM | POA: Diagnosis not present

## 2020-03-24 LAB — URINE CULTURE

## 2020-03-28 ENCOUNTER — Ambulatory Visit (INDEPENDENT_AMBULATORY_CARE_PROVIDER_SITE_OTHER): Payer: Medicare HMO | Admitting: Nurse Practitioner

## 2020-03-28 ENCOUNTER — Other Ambulatory Visit: Payer: Self-pay

## 2020-03-28 ENCOUNTER — Encounter: Payer: Self-pay | Admitting: Nurse Practitioner

## 2020-03-28 VITALS — BP 127/86 | HR 91 | Temp 97.8°F | Ht 70.0 in | Wt 211.0 lb

## 2020-03-28 DIAGNOSIS — R300 Dysuria: Secondary | ICD-10-CM | POA: Diagnosis not present

## 2020-03-28 DIAGNOSIS — N3000 Acute cystitis without hematuria: Secondary | ICD-10-CM

## 2020-03-28 LAB — URINALYSIS, COMPLETE
Bilirubin, UA: NEGATIVE
Glucose, UA: NEGATIVE
Ketones, UA: NEGATIVE
Nitrite, UA: NEGATIVE
Protein,UA: NEGATIVE
Specific Gravity, UA: 1.01 (ref 1.005–1.030)
Urobilinogen, Ur: 0.2 mg/dL (ref 0.2–1.0)
pH, UA: 7 (ref 5.0–7.5)

## 2020-03-28 LAB — MICROSCOPIC EXAMINATION

## 2020-03-28 MED ORDER — CLARITHROMYCIN 500 MG PO TABS: 500.0000 mg | ORAL_TABLET | Freq: Two times a day (BID) | ORAL | 0 refills | Status: DC

## 2020-03-28 NOTE — Patient Instructions (Signed)
Urinary Tract Infection, Adult  A urinary tract infection (UTI) is an infection of any part of the urinary tract. The urinary tract includes the kidneys, ureters, bladder, and urethra. These organs make, store, and get rid of urine in the body. An upper UTI affects the ureters and kidneys. A lower UTI affects the bladder and urethra. What are the causes? Most urinary tract infections are caused by bacteria in your genital area around your urethra, where urine leaves your body. These bacteria grow and cause inflammation of your urinary tract. What increases the risk? You are more likely to develop this condition if:  You have a urinary catheter that stays in place.  You are not able to control when you urinate or have a bowel movement (incontinence).  You are female and you: ? Use a spermicide or diaphragm for birth control. ? Have low estrogen levels. ? Are pregnant.  You have certain genes that increase your risk.  You are sexually active.  You take antibiotic medicines.  You have a condition that causes your flow of urine to slow down, such as: ? An enlarged prostate, if you are female. ? Blockage in your urethra. ? A kidney stone. ? A nerve condition that affects your bladder control (neurogenic bladder). ? Not getting enough to drink, or not urinating often.  You have certain medical conditions, such as: ? Diabetes. ? A weak disease-fighting system (immunesystem). ? Sickle cell disease. ? Gout. ? Spinal cord injury. What are the signs or symptoms? Symptoms of this condition include:  Needing to urinate right away (urgency).  Frequent urination. This may include small amounts of urine each time you urinate.  Pain or burning with urination.  Blood in the urine.  Urine that smells bad or unusual.  Trouble urinating.  Cloudy urine.  Vaginal discharge, if you are female.  Pain in the abdomen or the lower back. You may also have:  Vomiting or a decreased  appetite.  Confusion.  Irritability or tiredness.  A fever or chills.  Diarrhea. The first symptom in older adults may be confusion. In some cases, they may not have any symptoms until the infection has worsened. How is this diagnosed? This condition is diagnosed based on your medical history and a physical exam. You may also have other tests, including:  Urine tests.  Blood tests.  Tests for STIs (sexually transmitted infections). If you have had more than one UTI, a cystoscopy or imaging studies may be done to determine the cause of the infections. How is this treated? Treatment for this condition includes:  Antibiotic medicine.  Over-the-counter medicines to treat discomfort.  Drinking enough water to stay hydrated. If you have frequent infections or have other conditions such as a kidney stone, you may need to see a health care provider who specializes in the urinary tract (urologist). In rare cases, urinary tract infections can cause sepsis. Sepsis is a life-threatening condition that occurs when the body responds to an infection. Sepsis is treated in the hospital with IV antibiotics, fluids, and other medicines. Follow these instructions at home: Medicines  Take over-the-counter and prescription medicines only as told by your health care provider.  If you were prescribed an antibiotic medicine, take it as told by your health care provider. Do not stop using the antibiotic even if you start to feel better. General instructions  Make sure you: ? Empty your bladder often and completely. Do not hold urine for long periods of time. ? Empty your bladder after   sex. ? Wipe from front to back after urinating or having a bowel movement if you are female. Use each tissue only one time when you wipe.  Drink enough fluid to keep your urine pale yellow.  Keep all follow-up visits. This is important.   Contact a health care provider if:  Your symptoms do not get better after 1-2  days.  Your symptoms go away and then return. Get help right away if:  You have severe pain in your back or your lower abdomen.  You have a fever or chills.  You have nausea or vomiting. Summary  A urinary tract infection (UTI) is an infection of any part of the urinary tract, which includes the kidneys, ureters, bladder, and urethra.  Most urinary tract infections are caused by bacteria in your genital area.  Treatment for this condition often includes antibiotic medicines.  If you were prescribed an antibiotic medicine, take it as told by your health care provider. Do not stop using the antibiotic even if you start to feel better.  Keep all follow-up visits. This is important. This information is not intended to replace advice given to you by your health care provider. Make sure you discuss any questions you have with your health care provider. Document Revised: 09/11/2019 Document Reviewed: 09/11/2019 Elsevier Patient Education  2021 Elsevier Inc.  

## 2020-03-28 NOTE — Progress Notes (Signed)
Acute Office Visit  Subjective:    Patient ID: Alison Garrett, female    DOB: 08/11/1945, 75 y.o.   MRN: [id number]  Chief Complaint  Patient presents with  . Dysuria    HPI Patient is in today for SUBJECTIVE: Alison Garrett is a 75 y.o. female who complains of urinary frequency, urgency and dysuria x 5 days, without, fever, chills, or abnormal vaginal discharge or bleeding.   OBJECTIVE: Appears well, in no apparent distress.  Vital signs are normal. The abdomen is soft without tenderness, guarding, mass, rebound or organomegaly. No CVA tenderness or inguinal adenopathy noted. Urine dipstick shows positive for leukocytes.  Micro exam: few+ bacteria.   ASSESSMENT: UTI uncomplicated without evidence of pyelonephritis    Past Medical History:  Diagnosis Date  . Afib ([place])   . Complication of anesthesia    propathal   . Dyslipidemia   . Essential tremor 01/12/2014  . Fibromyalgia    Not currently under active treatment  . GERD (gastroesophageal reflux disease)   . Hiatal hernia   . Hypertension   . Obesity     Past Surgical History:  Procedure Laterality Date  . ABDOMINAL HYSTERECTOMY    . SVT ABLATION N/A 12/19/2018   Procedure: SVT ABLATION;  Surgeon: Constance Haw, MD;  Location: [facility];  Service: Cardiovascular;  Laterality: N/A;    Family History  Problem Relation Age of Onset  . Coronary artery disease Mother   . Heart disease Mother        CABG  . Cancer Mother        trachea  . COPD Father   . Hepatitis Sister        autoimune  . Diabetes Sister     Social History   Socioeconomic History  . Marital status: Divorced    Spouse name: Not on file  . Number of children: 3  . Years of education: some college  . Highest education level: Some college, no degree  Occupational History    Employer: [employer]     Comment: full time  Tobacco Use  . Smoking status: Never Smoker  . Smokeless tobacco: Never Used  Vaping Use  .  Vaping Use: Never used  Substance and Sexual Activity  . Alcohol use: No    Alcohol/week: 0.0 standard drinks  . Drug use: No  . Sexual activity: Not Currently  Other Topics Concern  . Not on file  Social History Narrative   Lives in [place].   Patient is right handed   Drinks very little caffeine.   Social Determinants of Health   Financial Resource Strain: Not on file  Food Insecurity: Not on file  Transportation Needs: Not on file  Physical Activity: Not on file  Stress: Not on file  Social Connections: Not on file  Intimate Partner Violence: Not on file    Outpatient Medications Prior to Visit  Medication Sig Dispense Refill  . aspirin 81 MG EC tablet Take 81 mg by mouth daily. Swallow whole.    . hydrochlorothiazide (HYDRODIURIL) 25 MG tablet Take 1 tablet by mouth once daily 90 tablet 0  . ciprofloxacin (CIPRO) 500 MG tablet Take 1 tablet (500 mg total) by mouth 2 (two) times daily. 10 tablet 0  . predniSONE (DELTASONE) 20 MG tablet 2 po at same time daily for 5 days 10 tablet 0   No facility-administered medications prior to visit.    Allergies  Allergen Reactions  . Ciprofloxacin  Shortness Of Breath and Rash  . Contrast Media [Iodinated Diagnostic Agents] Anaphylaxis  . Diltiazem Other (See Comments)    Pt. Had pain, jitters and felt terrible  . Metoprolol Nausea And Vomiting  . Penicillins Anaphylaxis    Has patient had a PCN reaction causing immediate rash, facial/tongue/throat swelling, SOB or lightheadedness with hypotension: Noyes Has patient had a PCN reaction causing severe rash involving mucus membranes or skin necrosis: Noyes Has patient had a PCN reaction that required hospitalization Nounknown Has patient had a PCN reaction occurring within the last 10 years: Nono If all of the above answers are "NO", then may proceed with Cephalo  . Nitrofuran Derivatives Nausea And Vomiting  . Sulfonamide Derivatives Hives  . Benicar [Olmesartan] Rash  .  Celebrex [Celecoxib] Anxiety  . Cymbalta [Duloxetine Hcl] Itching  . Latex Rash  . Neurontin [Gabapentin] Nausea Only  . Norvasc [Amlodipine Besylate] Swelling  . Prozac [Fluoxetine Hcl] Other (See Comments)  . Xanax [Alprazolam] Other (See Comments)  . Zoloft [Sertraline Hcl] Itching  . Zyprexa [Olanzapine] Other (See Comments)    Wt gain    Review of Systems  HENT: Negative.   Respiratory: Negative.   Cardiovascular: Negative.   Genitourinary: Positive for dysuria, pelvic pain and urgency. Negative for flank pain.  Skin: Negative.   Neurological: Negative.   All other systems reviewed and are negative.      Objective:    Physical Exam Vitals reviewed.  Constitutional:      Appearance: Normal appearance.  HENT:     Head: Normocephalic.     Nose: Nose normal.  Eyes:     Conjunctiva/sclera: Conjunctivae normal.  Cardiovascular:     Rate and Rhythm: Normal rate and regular rhythm.     Pulses: Normal pulses.     Heart sounds: Normal heart sounds.  Pulmonary:     Effort: Pulmonary effort is normal.     Breath sounds: Normal breath sounds.  Abdominal:     General: Bowel sounds are normal.     Tenderness: There is no right CVA tenderness or left CVA tenderness.  Genitourinary:    Comments: Dysuria Skin:    General: Skin is warm.  Neurological:     Mental Status: She is alert and oriented to person, place, and time.  Psychiatric:        Behavior: Behavior normal.     BP 127/86   Pulse 91   Temp 97.8 F (36.6 C)   Ht 5\' 10"  (1.778 m)   Wt 211 lb (95.7 kg)   SpO2 95%   BMI 30.28 kg/m  Wt Readings from Last 3 Encounters:  03/28/20 211 lb (95.7 kg)  10/14/19 206 lb 8 oz (93.7 kg)  03/24/19 211 lb 6.4 oz (95.9 kg)    Health Maintenance Due  Topic Date Due  . COVID-19 Vaccine (1) Never done  . COLONOSCOPY (Pts 45-41yrs Insurance coverage will need to be confirmed)  03/25/2013  . TETANUS/TDAP  12/14/2014  . INFLUENZA VACCINE  Never done  . COLON CANCER  SCREENING ANNUAL FOBT  10/08/2019    There are no preventive care reminders to display for this patient.   Lab Results  Component Value Date   TSH 1.630 03/04/2019   Lab Results  Component Value Date   WBC 7.1 03/04/2019   HGB 15.5 03/04/2019   HCT 46.0 03/04/2019   MCV 87 03/04/2019   PLT 257 03/04/2019   Lab Results  Component Value Date   NA 141 03/04/2019  K 4.5 03/04/2019   CO2 28 03/04/2019   GLUCOSE 115 (H) 03/04/2019   BUN 15 03/04/2019   CREATININE 1.11 (H) 03/04/2019   BILITOT 0.7 03/04/2019   ALKPHOS 128 (H) 03/04/2019   AST 28 03/04/2019   ALT 29 03/04/2019   PROT 7.0 03/04/2019   ALBUMIN 4.0 03/04/2019   CALCIUM 9.7 03/04/2019   ANIONGAP 8 02/22/2019   Lab Results  Component Value Date   CHOL 174 11/05/2018   Lab Results  Component Value Date   HDL 39 (L) 11/05/2018   Lab Results  Component Value Date   LDLCALC 108 (H) 11/05/2018   Lab Results  Component Value Date   TRIG 153 (H) 11/05/2018   Lab Results  Component Value Date   CHOLHDL 4.5 (H) 11/05/2018   Lab Results  Component Value Date   HGBA1C 5.7 11/05/2018       Assessment & Plan:   Problem List Items Addressed This Visit      Other   Dysuria - Primary    Patient previously treated 03/23/2020 for dysuria and started on Cipro.  Patient took 1 pill and reports having side effect from ciprofloxacin which is itching.  Started patient on Biaxin 500 mg twice daily.  Encouraged to also push fluids, may use Pyridium OTC prn. Call or return to clinic prn if these symptoms worsen or fail to improve as anticipated.   Rx sent to pharmacy.      Relevant Medications   clarithromycin (BIAXIN) 500 MG tablet       Meds ordered this encounter  Medications  . clarithromycin (BIAXIN) 500 MG tablet    Sig: Take 1 tablet (500 mg total) by mouth 2 (two) times daily.    Dispense:  14 tablet    Refill:  0    Order Specific Question:   Supervising Provider    Answer:   Janora Norlander  [[id number]]     Ivy Lynn, NP

## 2020-03-28 NOTE — Assessment & Plan Note (Signed)
Patient previously treated 03/23/2020 for dysuria and started on Cipro.  Patient took 1 pill and reports having side effect from ciprofloxacin which is itching.  Started patient on Biaxin 500 mg twice daily.  Encouraged to also push fluids, may use Pyridium OTC prn. Call or return to clinic prn if these symptoms worsen or fail to improve as anticipated.   Rx sent to pharmacy.

## 2020-03-30 DIAGNOSIS — Z1231 Encounter for screening mammogram for malignant neoplasm of breast: Secondary | ICD-10-CM | POA: Diagnosis not present

## 2020-03-30 LAB — URINE CULTURE: Organism ID, Bacteria: NO GROWTH

## 2020-05-02 ENCOUNTER — Other Ambulatory Visit: Payer: Self-pay | Admitting: Cardiology

## 2020-05-10 DIAGNOSIS — I2699 Other pulmonary embolism without acute cor pulmonale: Secondary | ICD-10-CM | POA: Insufficient documentation

## 2020-05-10 NOTE — Progress Notes (Signed)
Cardiology Office Note   Date:  05/11/2020   ID:  Alison Garrett, DOB 09-09-1945, MRN [id number]  PCP:  Dettinger, Fransisca Kaufmann, MD  Cardiologist:   Minus Breeding, MD   No chief complaint on file.     History of Present Illness: Alison Garrett is a 75 y.o. female who presents for follow up ofatrial fibrillation. She was in the hospital in Sept2031for chest pain and was found to have a small PE. She was sent home on anticoagulation. She wore a ZIO patch and was found to have SVT for 23 seconds. She is intolerant of metoprolol. She has multiple intolerances. She did see Dr. Alyson Reedy was offered an SVT ablation. She wanted to think about it and talk about it with me. At this point it still not entirely clear that her symptoms are related to this SVT.  I tried to use pindolol for management of her symptoms.  However, she only took a couple of days worth of this and then she states she got profoundly depressed.  She had an EP study by Dr. Curt Bears in November.   There was no inducible arrhythmia.   She had continued palpitations and was started on verapamil by Dr. Curt Bears in Feb. however, she did not feel good with this so she stopped taking it after 1 or 2 doses.  She is not relatively well.  She might get a rapid heart rate when she lifts something heavy.  She still works in Engineer, agricultural.  She is not having any atrial fibrillation.  He is not having any presyncope or syncope.  She denies any chest pressure, neck or arm discomfort.  She has had no new shortness of breath.  Past Medical History:  Diagnosis Date  . Afib ([place])   . Complication of anesthesia    propathal   . Dyslipidemia   . Essential tremor 01/12/2014  . Fibromyalgia    Not currently under active treatment  . GERD (gastroesophageal reflux disease)   . Hiatal hernia   . Hypertension   . Obesity     Past Surgical History:  Procedure Laterality Date  . ABDOMINAL HYSTERECTOMY    . SVT ABLATION N/A 12/19/2018    Procedure: SVT ABLATION;  Surgeon: Constance Haw, MD;  Location: [facility];  Service: Cardiovascular;  Laterality: N/A;     Current Outpatient Medications  Medication Sig Dispense Refill  . aspirin 81 MG EC tablet Take 81 mg by mouth daily. Swallow whole.    . hydrochlorothiazide (HYDRODIURIL) 25 MG tablet Take 1 tablet by mouth once daily 30 tablet 0   No current facility-administered medications for this visit.    Allergies:   Ciprofloxacin, Contrast media [iodinated diagnostic agents], Diltiazem, Metoprolol, Penicillins, Nitrofuran derivatives, Sulfonamide derivatives, Benicar [olmesartan], Celebrex [celecoxib], Cymbalta [duloxetine hcl], Latex, Neurontin [gabapentin], Norvasc [amlodipine besylate], Prozac [fluoxetine hcl], Xanax [alprazolam], Zoloft [sertraline hcl], and Zyprexa [olanzapine]   ROS:  Please see the history of present illness.   Otherwise, review of systems are positive for none.   All other systems are reviewed and negative.    PHYSICAL EXAM: VS:  BP (!) 150/98   Pulse 61   Ht 5\' 10"  (1.778 m)   Wt 210 lb (95.3 kg)   BMI 30.13 kg/m  , BMI Body mass index is 30.13 kg/m. GENERAL:  Well appearing NECK:  No jugular venous distention, waveform within normal limits, carotid upstroke brisk and symmetric, no bruits, no thyromegaly LUNGS:  Clear to auscultation  bilaterally CHEST:  Unremarkable HEART:  PMI not displaced or sustained,S1 and S2 within normal limits, no S3, no S4, no clicks, no rubs, no murmurs ABD:  Flat, positive bowel sounds normal in frequency in pitch, no bruits, no rebound, no guarding, no midline pulsatile mass, no hepatomegaly, no splenomegaly EXT:  2 plus pulses throughout, no edema, no cyanosis no clubbing   EKG:  EKG is  ordered today. The ekg ordered today demonstrates sinus rhythm, rate 61, axis within normal limits, intervals within normal limits, no acute ST-T wave changes.   Recent Labs: No results found for requested labs  within last 8760 hours.    Lipid Panel    Component Value Date/Time   CHOL 174 11/05/2018 0908   CHOL 170 06/04/2012 0925   TRIG 153 (H) 11/05/2018 0908   TRIG CANCELED 08/13/2015 0816   TRIG 94 06/04/2012 0925   HDL 39 (L) 11/05/2018 0908   HDL CANCELED 08/13/2015 0816   HDL 41 06/04/2012 0925   CHOLHDL 4.5 (H) 11/05/2018 0908   LDLCALC 108 (H) 11/05/2018 0908   LDLCALC 105 (H) 06/18/2013 0834   LDLCALC 110 (H) 06/04/2012 0925      Wt Readings from Last 3 Encounters:  05/11/20 210 lb (95.3 kg)  03/28/20 211 lb (95.7 kg)  10/14/19 206 lb 8 oz (93.7 kg)      Other studies Reviewed: Additional studies/ records that were reviewed today include:  EP notes Review of the above records demonstrates:  Please see elsewhere in the note.     ASSESSMENT AND PLAN:  ATRIAL FIB:    Alison Garrett has a CHA2DS2 - VASc score of 3 with a risk of stroke of 3.2%.    She had agreed in the past half dose anticoagulation for her unprovoked pulmonary emboli.   However, she subsequently stopped this and only wants to take aspirin.    PALPITATIONS:   These are not particularly symptomatic.  No change in therapy.  HTN:   Her BP is elevated but this is unusual.  She really well treated at home.  PULMONARY EMBOLISM:    She was on prophylaxis dose DOAC for an unprovoked PE.  She understands the logic behind this but has chosen not to take it.  She remains on aspirin.   Current medicines are reviewed at length with the patient today.  The patient does not have concerns regarding medicines.  The following changes have been made:  None  Labs/ tests ordered today include:   None No orders of the defined types were placed in this encounter.    Disposition:   FU with me in 12 months   Signed, Minus Breeding, MD  05/11/2020 9:21 AM    [facility]

## 2020-05-11 ENCOUNTER — Other Ambulatory Visit: Payer: Self-pay

## 2020-05-11 ENCOUNTER — Encounter: Payer: Self-pay | Admitting: Cardiology

## 2020-05-11 ENCOUNTER — Ambulatory Visit: Payer: Medicare HMO | Admitting: Cardiology

## 2020-05-11 ENCOUNTER — Ambulatory Visit (INDEPENDENT_AMBULATORY_CARE_PROVIDER_SITE_OTHER): Payer: Medicare HMO | Admitting: Family Medicine

## 2020-05-11 ENCOUNTER — Encounter: Payer: Self-pay | Admitting: Family Medicine

## 2020-05-11 VITALS — BP 136/84 | HR 69 | Temp 97.7°F | Wt 209.6 lb

## 2020-05-11 VITALS — BP 150/98 | HR 61 | Ht 70.0 in | Wt 210.0 lb

## 2020-05-11 DIAGNOSIS — N182 Chronic kidney disease, stage 2 (mild): Secondary | ICD-10-CM

## 2020-05-11 DIAGNOSIS — I10 Essential (primary) hypertension: Secondary | ICD-10-CM

## 2020-05-11 DIAGNOSIS — I2699 Other pulmonary embolism without acute cor pulmonale: Secondary | ICD-10-CM

## 2020-05-11 DIAGNOSIS — E7800 Pure hypercholesterolemia, unspecified: Secondary | ICD-10-CM

## 2020-05-11 DIAGNOSIS — I480 Paroxysmal atrial fibrillation: Secondary | ICD-10-CM

## 2020-05-11 LAB — CBC WITH DIFFERENTIAL/PLATELET
Basophils Absolute: 0.1 10*3/uL (ref 0.0–0.2)
Basos: 1 %
EOS (ABSOLUTE): 0.1 10*3/uL (ref 0.0–0.4)
Eos: 1 %
Hematocrit: 46 % (ref 34.0–46.6)
Hemoglobin: 15.7 g/dL (ref 11.1–15.9)
Immature Grans (Abs): 0 10*3/uL (ref 0.0–0.1)
Immature Granulocytes: 1 %
Lymphocytes Absolute: 1.6 10*3/uL (ref 0.7–3.1)
Lymphs: 25 %
MCH: 29.2 pg (ref 26.6–33.0)
MCHC: 34.1 g/dL (ref 31.5–35.7)
MCV: 86 fL (ref 79–97)
Monocytes Absolute: 0.5 10*3/uL (ref 0.1–0.9)
Monocytes: 8 %
Neutrophils Absolute: 4.1 10*3/uL (ref 1.4–7.0)
Neutrophils: 64 %
Platelets: 254 10*3/uL (ref 150–450)
RBC: 5.38 x10E6/uL — ABNORMAL HIGH (ref 3.77–5.28)
RDW: 13.5 % (ref 11.7–15.4)
WBC: 6.4 10*3/uL (ref 3.4–10.8)

## 2020-05-11 LAB — CMP14+EGFR
ALT: 44 IU/L — ABNORMAL HIGH (ref 0–32)
AST: 35 IU/L (ref 0–40)
Albumin/Globulin Ratio: 1.3 (ref 1.2–2.2)
Albumin: 4.3 g/dL (ref 3.7–4.7)
Alkaline Phosphatase: 113 IU/L (ref 44–121)
BUN/Creatinine Ratio: 16 (ref 12–28)
BUN: 18 mg/dL (ref 8–27)
Bilirubin Total: 1 mg/dL (ref 0.0–1.2)
CO2: 25 mmol/L (ref 20–29)
Calcium: 10 mg/dL (ref 8.7–10.3)
Chloride: 99 mmol/L (ref 96–106)
Creatinine, Ser: 1.15 mg/dL — ABNORMAL HIGH (ref 0.57–1.00)
Globulin, Total: 3.2 g/dL (ref 1.5–4.5)
Glucose: 110 mg/dL — ABNORMAL HIGH (ref 65–99)
Potassium: 4.7 mmol/L (ref 3.5–5.2)
Sodium: 141 mmol/L (ref 134–144)
Total Protein: 7.5 g/dL (ref 6.0–8.5)
eGFR: 50 mL/min/{1.73_m2} — ABNORMAL LOW (ref >59)

## 2020-05-11 LAB — LIPID PANEL
Chol/HDL Ratio: 4.3 ratio (ref 0.0–4.4)
Cholesterol, Total: 195 mg/dL (ref 100–199)
HDL: 45 mg/dL (ref >39)
LDL Chol Calc (NIH): 128 mg/dL — ABNORMAL HIGH (ref 0–99)
Triglycerides: 124 mg/dL (ref 0–149)
VLDL Cholesterol Cal: 22 mg/dL (ref 5–40)

## 2020-05-11 MED ORDER — HYDROCHLOROTHIAZIDE 25 MG PO TABS: 25.0000 mg | ORAL_TABLET | Freq: Every day | ORAL | 3 refills | Status: DC

## 2020-05-11 NOTE — Progress Notes (Signed)
BP 136/84   Pulse 69   Temp 97.7 F (36.5 C) (Temporal)   Wt 209 lb 9.6 oz (95.1 kg)   SpO2 96%   BMI 30.07 kg/m    Subjective:   Patient ID: Alison Garrett, female    DOB: 02-26-1945, 75 y.o.   MRN: [id number]  HPI: Alison Garrett is a 75 y.o. female presenting on 05/11/2020 for Medical Management of Chronic Issues   HPI Hypertension Patient is currently on hydrochlorothiazide, and their blood pressure today is 96/84. Patient denies any lightheadedness or dizziness. Patient denies headaches, blurred vision, chest pains, shortness of breath, or weakness. Denies any side effects from medication and is content with current medication.   Hyperlipidemia Patient is coming in for recheck of his hyperlipidemia. The patient is currently taking medication, has allergies to a lot of medications.. They deny any issues with myalgias or history of liver damage from it. They deny any focal numbness or weakness or chest pain.   Patient has CKD stage II chronic, we will recheck levels today.  He denies any urinary issues.  Relevant past medical, surgical, family and social history reviewed and updated as indicated. Interim medical history since our last visit reviewed. Allergies and medications reviewed and updated.  Review of Systems  Constitutional: Negative for chills and fever.  Eyes: Negative for visual disturbance.  Respiratory: Negative for chest tightness and shortness of breath.   Cardiovascular: Negative for chest pain and leg swelling.  Gastrointestinal: Negative for abdominal pain.  Musculoskeletal: Negative for back pain and gait problem.  Skin: Negative for rash.  Neurological: Negative for light-headedness and headaches.  Psychiatric/Behavioral: Negative for agitation and behavioral problems.  All other systems reviewed and are negative.   Per HPI unless specifically indicated above      Objective:   BP 136/84   Pulse 69   Temp 97.7 F (36.5 C) (Temporal)   Wt 209  lb 9.6 oz (95.1 kg)   SpO2 96%   BMI 30.07 kg/m   Wt Readings from Last 3 Encounters:  05/11/20 209 lb 9.6 oz (95.1 kg)  05/11/20 210 lb (95.3 kg)  03/28/20 211 lb (95.7 kg)    Physical Exam Vitals and nursing note reviewed.  Constitutional:      General: She is not in acute distress.    Appearance: She is well-developed. She is not diaphoretic.  Eyes:     Conjunctiva/sclera: Conjunctivae normal.  Cardiovascular:     Rate and Rhythm: Normal rate and regular rhythm.     Heart sounds: Normal heart sounds. No murmur heard.   Pulmonary:     Effort: Pulmonary effort is normal. No respiratory distress.     Breath sounds: Normal breath sounds. No wheezing.  Musculoskeletal:        General: No tenderness. Normal range of motion.  Skin:    General: Skin is warm and dry.     Findings: No rash.  Neurological:     Mental Status: She is alert and oriented to person, place, and time.     Coordination: Coordination normal.  Psychiatric:        Behavior: Behavior normal.       Assessment & Plan:   Problem List Items Addressed This Visit      Cardiovascular and Mediastinum   HYPERTENSION, BENIGN - Primary   Relevant Medications   hydrochlorothiazide (HYDRODIURIL) 25 MG tablet   Other Relevant Orders   CBC with Differential/Platelet   CMP14+EGFR   Lipid panel  Genitourinary   CKD (chronic kidney disease) stage 2, GFR 60-89 ml/min   Relevant Orders   CMP14+EGFR     Other   Hyperlipemia   Relevant Medications   hydrochlorothiazide (HYDRODIURIL) 25 MG tablet   Other Relevant Orders   Lipid panel    continue hctz we will do blood work today.  Follow up plan: Return in about 1 year (around 05/11/2021), or if symptoms worsen or fail to improve, for htn.  Counseling provided for all of the vaccine components Orders Placed This Encounter  Procedures  . CBC with Differential/Platelet  . CMP14+EGFR  . Lipid panel    Caryl Pina, MD [facility] 05/11/2020, 10:17 AM

## 2020-05-11 NOTE — Patient Instructions (Signed)
Medication Instructions:  The current medical regimen is effective;  continue present plan and medications.  *If you need a refill on your cardiac medications before your next appointment, please call your pharmacy*  Follow-Up: At [facility], you and your health needs are our priority.  As part of our continuing mission to provide you with exceptional heart care, we have created designated Provider Care Teams.  These Care Teams include your primary Cardiologist (physician) and Advanced Practice Providers (APPs -  Physician Assistants and Nurse Practitioners) who all work together to provide you with the care you need, when you need it.  We recommend signing up for the patient portal called "MyChart".  Sign up information is provided on this After Visit Summary.  MyChart is used to connect with patients for Virtual Visits (Telemedicine).  Patients are able to view lab/test results, encounter notes, upcoming appointments, etc.  Non-urgent messages can be sent to your provider as well.   To learn more about what you can do with MyChart, go to [email or web address].    Your next appointment:   12 month(s)  The format for your next appointment:   In Person  Provider:   James Hochrein, MD   Thank you for choosing [facility]!!     

## 2020-05-26 ENCOUNTER — Ambulatory Visit (INDEPENDENT_AMBULATORY_CARE_PROVIDER_SITE_OTHER): Payer: Medicare HMO

## 2020-05-26 VITALS — Ht 70.0 in | Wt 208.0 lb

## 2020-05-26 DIAGNOSIS — Z0000 Encounter for general adult medical examination without abnormal findings: Secondary | ICD-10-CM | POA: Diagnosis not present

## 2020-05-26 NOTE — Progress Notes (Signed)
Subjective:   Alison Garrett is a 75 y.o. female who presents for Medicare Annual (Subsequent) preventive examination.  Virtual Visit via Telephone Note  I connected with  Alison Garrett on 05/26/20 at  2:00 PM EDT by telephone and verified that I am speaking with the correct person using two identifiers.  Location: Patient: Home Provider: [facility] Persons participating in the virtual visit: patient/Nurse Health Advisor   I discussed the limitations, risks, security and privacy concerns of performing an evaluation and management service by telephone and the availability of in person appointments. The patient expressed understanding and agreed to proceed.  Interactive audio and video telecommunications were attempted between this nurse and patient, however failed, due to patient having technical difficulties OR patient did not have access to video capability.  We continued and completed visit with audio only.  Some vital signs may be absent or patient reported.   Yahya Boldman E Adriona Kaney, LPN   Review of Systems     Cardiac Risk Factors include: advanced age (>55men, >67 women);dyslipidemia;hypertension;obesity (BMI >30kg/m2)     Objective:    Today's Vitals   05/26/20 1258  Weight: 208 lb (94.3 kg)  Height: 5\' 10"  (1.778 m)   Body mass index is 29.84 kg/m.  Advanced Directives 05/26/2020 03/23/2019 02/22/2019 12/19/2018 11/26/2018 11/18/2017 11/05/2017  Does Patient Have a Medical Advance Directive? No No No No No Yes No  Type of Advance Directive - - - - - Living will;Healthcare Power of Attorney -  Does patient want to make changes to medical advance directive? - - - - - No - Patient declined -  Copy of [place] in Chart? - - - - - No - copy requested -  Would patient like information on creating a medical advance directive? Yes (MAU/Ambulatory/Procedural Areas - Information given) - No - Patient declined No - Patient declined No - Patient declined - No - Patient declined     Current Medications (verified) Outpatient Encounter Medications as of 05/26/2020  Medication Sig  . aspirin 81 MG EC tablet Take 81 mg by mouth daily. Swallow whole.  . hydrochlorothiazide (HYDRODIURIL) 25 MG tablet Take 1 tablet (25 mg total) by mouth daily.   No facility-administered encounter medications on file as of 05/26/2020.    Allergies (verified) Ciprofloxacin, Contrast media [iodinated diagnostic agents], Diltiazem, Metoprolol, Penicillins, Nitrofuran derivatives, Sulfonamide derivatives, Benicar [olmesartan], Celebrex [celecoxib], Cymbalta [duloxetine hcl], Latex, Neurontin [gabapentin], Norvasc [amlodipine besylate], Prozac [fluoxetine hcl], Xanax [alprazolam], Zoloft [sertraline hcl], and Zyprexa [olanzapine]   History: Past Medical History:  Diagnosis Date  . Afib ([place])   . Complication of anesthesia    propathal   . Dyslipidemia   . Essential tremor 01/12/2014  . Fibromyalgia    Not currently under active treatment  . GERD (gastroesophageal reflux disease)   . Hiatal hernia   . Hypertension   . Obesity    Past Surgical History:  Procedure Laterality Date  . ABDOMINAL HYSTERECTOMY    . SVT ABLATION N/A 12/19/2018   Procedure: SVT ABLATION;  Surgeon: Constance Haw, MD;  Location: [facility];  Service: Cardiovascular;  Laterality: N/A;   Family History  Problem Relation Age of Onset  . Coronary artery disease Mother   . Heart disease Mother        CABG  . Cancer Mother        trachea  . COPD Father   . Hepatitis Sister        autoimune  .  Diabetes Sister    Social History   Socioeconomic History  . Marital status: Divorced    Spouse name: Not on file  . Number of children: 3  . Years of education: some college  . Highest education level: Some college, no degree  Occupational History    Employer: [employer]     Comment: full time  Tobacco Use  . Smoking status: Never Smoker  . Smokeless tobacco: Never Used  Vaping  Use  . Vaping Use: Never used  Substance and Sexual Activity  . Alcohol use: No    Alcohol/week: 0.0 standard drinks  . Drug use: No  . Sexual activity: Not Currently  Other Topics Concern  . Not on file  Social History Narrative   Lives in [place].   Patient is right handed   Drinks very little caffeine.   Works every day but Wednesday and Friday   Lives alone.   Social Determinants of Health   Financial Resource Strain: Low Risk   . Difficulty of Paying Living Expenses: Not hard at all  Food Insecurity: No Food Insecurity  . Worried About Charity fundraiser in the Last Year: Never true  . Ran Out of Food in the Last Year: Never true  Transportation Needs: No Transportation Needs  . Lack of Transportation (Medical): No  . Lack of Transportation (Non-Medical): No  Physical Activity: Not on file  Stress: No Stress Concern Present  . Feeling of Stress : Not at all  Social Connections: Socially Isolated  . Frequency of Communication with Friends and Family: More than three times a week  . Frequency of Social Gatherings with Friends and Family: More than three times a week  . Attends Religious Services: Never  . Active Member of Clubs or Organizations: No  . Attends Archivist Meetings: Never  . Marital Status: Divorced    Tobacco Counseling Counseling given: No   Clinical Intake:  Pre-visit preparation completed: Yes  Pain : No/denies pain     BMI - recorded: 29.84 Nutritional Status: BMI 25 -29 Overweight Nutritional Risks: None Diabetes: No  How often do you need to have someone help you when you read instructions, pamphlets, or other written materials from your doctor or pharmacy?: 1 - Never  Diabetic? No  Interpreter Needed?: No  Information entered by :: Tilmon Wisehart, LPN   Activities of Daily Living In your present state of health, do you have any difficulty performing the following activities: 05/26/2020  Hearing? N  Vision? N   Difficulty concentrating or making decisions? N  Walking or climbing stairs? N  Dressing or bathing? N  Doing errands, shopping? N  Preparing Food and eating ? N  Using the Toilet? N  In the past six months, have you accidently leaked urine? N  Do you have problems with loss of bowel control? N  Managing your Medications? N  Managing your Finances? N  Housekeeping or managing your Housekeeping? N  Some recent data might be hidden    Patient Care Team: Dettinger, Fransisca Kaufmann, MD as PCP - General (Family Medicine) Minus Breeding, MD as PCP - Cardiology (Cardiology) Constance Haw, MD as PCP - Electrophysiology (Cardiology) Ilean China, RN as Registered Nurse Alda Berthold, DO as Consulting Physician (Neurology) Celestia Khat, [place] (Optometry)  Indicate any recent Medical Services you may have received from other than [facility] providers in the past year (date may be approximate).     Assessment:   This is  a routine wellness examination for Alison Garrett.  Hearing/Vision screen No exam data present  Dietary issues and exercise activities discussed: Current Exercise Habits: Home exercise routine, Time (Minutes): 45, Frequency (Times/Week): 7, Weekly Exercise (Minutes/Week): 315, Intensity: Mild, Exercise limited by: None identified  Goals    .  "I need to exercise more" (pt-stated)      Current Barriers:  . Chronic Disease Management support and education needs related to weight management and physical fitness . Excessive heat  Nurse Case Manager Clinical Goal(s):  Marland Kitchen Over the next 30 days, patient will verbalize understanding of plan for increased physical activity . Over the next 30 days, patient will work with Consulting civil engineer to address needs related to weight management and physical activity level.   Interventions:  . Discussed current physical activity level  o None other than working around the house and any walking/movement done at work o States that it is too hot to  consider increasing physical activity level right now. She would like to implement something in the fall, winter, and spring . Discussed potential problem with SVT/Afib and exercise  Patient Self Care Activities:  . Performs ADL's independently . Performs IADL's independently  Initial goal documentation     .  "I want to keep my blood pressure under control" (pt-stated)      Current Barriers:  . Chronic Disease Management support and education needs related to hypertension  Nurse Case Manager Clinical Goal(s):  Marland Kitchen Over the next 90 days, patient will demonstrate improved adherence to prescribed treatment plan for hypertension as evidenced bynormal home blood pressure readings  Interventions:  . Evaluation of current treatment plan related to hypertension and patient's adherence to plan as established by provider. . Reviewed medications with patient and discussed HCTZ and intolerance to other antihypertensives . Discussed plans with patient for ongoing care management follow up and provided patient with direct contact information for care management team . Previously advised patient, providing education and rationale, to monitor blood pressure daily and record, calling [phone] for findings outside established parameters.  . Encouraged to keep regular follow-up appointments with PCP and cardiologist  Patient Self Care Activities:  . Performs ADL's independently . Performs IADL's independently  Please see past updates related to this goal by clicking on the "Past Updates" button in the selected goal      .  "I would like for SVT to resolve" (pt-stated)      Current Barriers:  . Chronic Disease Management support and education needs related to SVT management and treatment  Nurse Case Manager Clinical Goal(s):  Marland Kitchen Over the next 90 days, patient will report any episodes of SVT to her cardiologist  Interventions:  . Chart reviewed including recent office notes and procedure  notes . Medications reviewed and discussed verapamil with patient o She took 1/2 of a 120mg  tablet and developed chest pain and shortness of breath. She did not take any additional doses o Dr Curt Bears recommended a 6 month f/u but patient prefers to just see Dr Percival Spanish for her cardiac care. She is supposed to see him in 6 months as well. o Reports that pulse stays around 80. No complaints at this time.  . Previously provided education to patient re: SVT self management with valsalva maneuver  . Provided education to patient re: when to call 911, cardiology, or RNCM . Provided with CCM contact information and encouraged to reach out as needed . Advised to reach out to Dr Percival Spanish with any cardiac concerns or  episodes of SVT  Patient Self Care Activities:  . Performs ADL's independently . Performs IADL's independently  Please see past updates related to this goal by clicking on the "Past Updates" button in the selected goal      .  Concern for social isolation, especially due to [place] Guidelines      Current Barriers:  . None identified  Nurse Case Manager Clinical Goal(s):  Marland Kitchen Over the next 30 days, patient will continue to work ([employer]) and maintain current level of socialization within recommended guidelines . Over the next 60 days, patient will talk with RN Care Manager, LCSW, PCP, or another professional regarding any negative effects that social distancing is having on her mental and physical health.  . Over the next 60 days, patient will continue to engage in healthy alternatives to in-person socializing  Interventions:  . Encouraged patient to continue reading as a pastime . Advised to seek help if needed . Encouraged to continue to work as safely as possible  Patient Self Care Activities:  . Performs ADL's independently . Performs IADL's independently  Initial goal documentation      .  DIET - INCREASE WATER INTAKE      Try  to drink 6-8 glasses of water daily.    .  Exercise 150 min/wk Moderate Activity      Goals Addressed             This Visit's Progress   . DIET - INCREASE WATER INTAKE   On track    Try to drink 6-8 glasses of water daily.    . Exercise 150 min/wk Moderate Activity   On track            Depression Screen PHQ 2/9 Scores 05/26/2020 05/11/2020 03/28/2020 10/14/2019 03/04/2019 11/26/2018 11/05/2018  PHQ - 2 Score 0 0 0 0 0 0 0  PHQ- 9 Score - - - - - - -    Fall Risk Fall Risk  05/26/2020 05/11/2020 03/28/2020 10/14/2019 03/23/2019  Falls in the past year? 0 0 0 0 0  Number falls in past yr: 0 - - - 0  Injury with Fall? 0 - - - 0  Risk for fall due to : No Fall Risks - - - -  Follow up Falls prevention discussed - - - -  Comment - - - - -    FALL RISK PREVENTION PERTAINING TO THE HOME:  Any stairs in or around the home? No  If so, are there any without handrails? No  Home free of loose throw rugs in walkways, pet beds, electrical cords, etc? Yes  Adequate lighting in your home to reduce risk of falls? Yes   ASSISTIVE DEVICES UTILIZED TO PREVENT FALLS:  Life alert? No  Use of a cane, walker or w/c? No  Grab bars in the bathroom? No  Shower chair or bench in shower? No  Elevated toilet seat or a handicapped toilet? No   TIMED UP AND GO:  Was the test performed? No . Telephonic visit   Cognitive Function: Normal cognitive status assessed by direct observation by this Nurse Health Advisor. No abnormalities found.   MMSE - Mini Mental State Exam 11/18/2017  Orientation to time 5  Orientation to Place 5  Registration 3  Attention/ Calculation 5  Recall 3  Language- name 2 objects 2  Language- repeat 1  Language- follow 3 step command 3  Language- read & follow direction 1  Write a  sentence 1  Copy design 1  Total score 30     6CIT Screen 11/26/2018  What Year? 0 points  What month? 0 points  What time? 0 points  Count back from 20 0 points  Months in reverse 0  points  Repeat phrase 2 points  Total Score 2    Immunizations  There is no immunization history on file for this patient.  TDAP status: Due, Education has been provided regarding the importance of this vaccine. Advised may receive this vaccine at local pharmacy or Health Dept. Aware to provide a copy of the vaccination record if obtained from local pharmacy or Health Dept. Verbalized acceptance and understanding.  Flu Vaccine status: Declined, Education has been provided regarding the importance of this vaccine but patient still declined. Advised may receive this vaccine at local pharmacy or Health Dept. Aware to provide a copy of the vaccination record if obtained from local pharmacy or Health Dept. Verbalized acceptance and understanding.  Pneumococcal vaccine status: Declined,  Education has been provided regarding the importance of this vaccine but patient still declined. Advised may receive this vaccine at local pharmacy or Health Dept. Aware to provide a copy of the vaccination record if obtained from local pharmacy or Health Dept. Verbalized acceptance and understanding.   Covid-19 vaccine status: Declined, Education has been provided regarding the importance of this vaccine but patient still declined. Advised may receive this vaccine at local pharmacy or Health Dept.or vaccine clinic. Aware to provide a copy of the vaccination record if obtained from local pharmacy or Health Dept. Verbalized acceptance and understanding.  Qualifies for Shingles Vaccine? Yes   Zostavax completed No   Shingrix Completed?: No.    Education has been provided regarding the importance of this vaccine. Patient has been advised to call insurance company to determine out of pocket expense if they have not yet received this vaccine. Advised may also receive vaccine at local pharmacy or Health Dept. Verbalized acceptance and understanding.  Screening Tests Health Maintenance  Topic Date Due  . COVID-19 Vaccine  (1) 05/27/2020 (Originally 11/18/1950)  . COLON CANCER SCREENING ANNUAL FOBT  11/11/2020 (Originally 10/08/2019)  . COLONOSCOPY (Pts 45-73yrs Insurance coverage will need to be confirmed)  05/11/2021 (Originally 03/25/2013)  . TETANUS/TDAP  05/11/2021 (Originally 12/14/2014)  . INFLUENZA VACCINE  09/12/2020  . MAMMOGRAM  03/30/2021  . DEXA SCAN  05/23/2021  . Hepatitis C Screening  Completed  . HPV VACCINES  Aged Out  . PNA vac Low Risk Adult  Discontinued    Health Maintenance  There are no preventive care reminders to display for this patient.  Colorectal cancer screening: Type of screening: Cologuard. Completed 2019. Repeat every 3 years I asked her to have her insurance company fax [place] the results.  Mammogram status: Completed 04/08/2020. Repeat every year  Bone Density status: Completed 05/23/2016. Results reflect: Bone density results: NORMAL. Repeat every 5 years.  Lung Cancer Screening: (Low Dose CT Chest recommended if Age 61-80 years, 30 pack-year currently smoking OR have quit w/in 15years.) does not qualify.   Additional Screening:  Hepatitis C Screening: does qualify; Completed 11/05/2018  Vision Screening: Recommended annual ophthalmology exams for early detection of glaucoma and other disorders of the eye. Is the patient up to date with their annual eye exam?  Yes  Who is the provider or what is the name of the office in which the patient attends annual eye exams? [facility] in [place] If pt is not established with a provider, would they  like to be referred to a provider to establish care? No .   Dental Screening: Recommended annual dental exams for proper oral hygiene  Community Resource Referral / Chronic Care Management: CRR required this visit?  No   CCM required this visit?  No      Plan:     I have personally reviewed and noted the following in the patient's chart:   . Medical and social history . Use of alcohol, tobacco or illicit drugs  . Current  medications and supplements . Functional ability and status . Nutritional status . Physical activity . Advanced directives . List of other physicians . Hospitalizations, surgeries, and ER visits in previous 12 months . Vitals . Screenings to include cognitive, depression, and falls . Referrals and appointments  In addition, I have reviewed and discussed with patient certain preventive protocols, quality metrics, and best practice recommendations. A written personalized care plan for preventive services as well as general preventive health recommendations were provided to patient.     Sandrea Hammond, LPN   [id number]   Nurse Notes: None

## 2020-05-26 NOTE — Patient Instructions (Signed)
Alison Garrett , Thank you for taking time to come for your Medicare Wellness Visit. I appreciate your ongoing commitment to your health goals. Please review the following plan we discussed and let me know if I can assist you in the future.   Screening recommendations/referrals: Colonoscopy: Cologuard from insurance company every 3 years - Please have results sent to [place]. Mammogram: Done 04/08/2020 - Repeat annually Bone Density: Done 05/23/2016 - Repeat every 5 years Recommended yearly ophthalmology/optometry visit for glaucoma screening and checkup Recommended yearly dental visit for hygiene and checkup  Vaccinations: she declines all vaccines due to multiple allergies Influenza vaccine:  Pneumococcal vaccine: Tdap vaccine: Shingles vaccine:   Covid-19  Advanced directives: Advance directive discussed with you today. I have provided a copy for you to complete at home and have notarized. Once this is complete please bring a copy in to our office so we can scan it into your chart.   Conditions/risks identified: Keep up the great work! Continue drinking plenty of water and try to exercise or go for a brisk walk for 30 minutes each day.  Next appointment: Follow up in one year for your annual wellness visit    Preventive Care 65 Years and Older, Female Preventive care refers to lifestyle choices and visits with your health care provider that can promote health and wellness. What does preventive care include?  A yearly physical exam. This is also called an annual well check.  Dental exams once or twice a year.  Routine eye exams. Ask your health care provider how often you should have your eyes checked.  Personal lifestyle choices, including:  Daily care of your teeth and gums.  Regular physical activity.  Eating a healthy diet.  Avoiding tobacco and drug use.  Limiting alcohol use.  Practicing safe sex.  Taking low-dose aspirin every day.  Taking vitamin and mineral  supplements as recommended by your health care provider. What happens during an annual well check? The services and screenings done by your health care provider during your annual well check will depend on your age, overall health, lifestyle risk factors, and family history of disease. Counseling  Your health care provider may ask you questions about your:  Alcohol use.  Tobacco use.  Drug use.  Emotional well-being.  Home and relationship well-being.  Sexual activity.  Eating habits.  History of falls.  Memory and ability to understand (cognition).  Work and work Statistician.  Reproductive health. Screening  You may have the following tests or measurements:  Height, weight, and BMI.  Blood pressure.  Lipid and cholesterol levels. These may be checked every 5 years, or more frequently if you are over 71 years old.  Skin check.  Lung cancer screening. You may have this screening every year starting at age 72 if you have a 30-pack-year history of smoking and currently smoke or have quit within the past 15 years.  Fecal occult blood test (FOBT) of the stool. You may have this test every year starting at age 29.  Flexible sigmoidoscopy or colonoscopy. You may have a sigmoidoscopy every 5 years or a colonoscopy every 10 years starting at age 28.  Hepatitis C blood test.  Hepatitis B blood test.  Sexually transmitted disease (STD) testing.  Diabetes screening. This is done by checking your blood sugar (glucose) after you have not eaten for a while (fasting). You may have this done every 1-3 years.  Bone density scan. This is done to screen for osteoporosis. You may have this  done starting at age 70.  Mammogram. This may be done every 1-2 years. Talk to your health care provider about how often you should have regular mammograms. Talk with your health care provider about your test results, treatment options, and if necessary, the need for more tests. Vaccines  Your  health care provider may recommend certain vaccines, such as:  Influenza vaccine. This is recommended every year.  Tetanus, diphtheria, and acellular pertussis (Tdap, Td) vaccine. You may need a Td booster every 10 years.  Zoster vaccine. You may need this after age 79.  Pneumococcal 13-valent conjugate (PCV13) vaccine. One dose is recommended after age 45.  Pneumococcal polysaccharide (PPSV23) vaccine. One dose is recommended after age 42. Talk to your health care provider about which screenings and vaccines you need and how often you need them. This information is not intended to replace advice given to you by your health care provider. Make sure you discuss any questions you have with your health care provider. Document Released: 02/25/2015 Document Revised: 10/19/2015 Document Reviewed: 11/30/2014 Elsevier Interactive Patient Education  2017 [employer] Prevention in the Home Falls can cause injuries. They can happen to people of all ages. There are many things you can do to make your home safe and to help prevent falls. What can I do on the outside of my home?  Regularly fix the edges of walkways and driveways and fix any cracks.  Remove anything that might make you trip as you walk through a door, such as a raised step or threshold.  Trim any bushes or trees on the path to your home.  Use bright outdoor lighting.  Clear any walking paths of anything that might make someone trip, such as rocks or tools.  Regularly check to see if handrails are loose or broken. Make sure that both sides of any steps have handrails.  Any raised decks and porches should have guardrails on the edges.  Have any leaves, snow, or ice cleared regularly.  Use sand or salt on walking paths during winter.  Clean up any spills in your garage right away. This includes oil or grease spills. What can I do in the bathroom?  Use night lights.  Install grab bars by the toilet and in the tub and  shower. Do not use towel bars as grab bars.  Use non-skid mats or decals in the tub or shower.  If you need to sit down in the shower, use a plastic, non-slip stool.  Keep the floor dry. Clean up any water that spills on the floor as soon as it happens.  Remove soap buildup in the tub or shower regularly.  Attach bath mats securely with double-sided non-slip rug tape.  Do not have throw rugs and other things on the floor that can make you trip. What can I do in the bedroom?  Use night lights.  Make sure that you have a light by your bed that is easy to reach.  Do not use any sheets or blankets that are too big for your bed. They should not hang down onto the floor.  Have a firm chair that has side arms. You can use this for support while you get dressed.  Do not have throw rugs and other things on the floor that can make you trip. What can I do in the kitchen?  Clean up any spills right away.  Avoid walking on wet floors.  Keep items that you use a lot in easy-to-reach places.  If you need to reach something above you, use a strong step stool that has a grab bar.  Keep electrical cords out of the way.  Do not use floor polish or wax that makes floors slippery. If you must use wax, use non-skid floor wax.  Do not have throw rugs and other things on the floor that can make you trip. What can I do with my stairs?  Do not leave any items on the stairs.  Make sure that there are handrails on both sides of the stairs and use them. Fix handrails that are broken or loose. Make sure that handrails are as long as the stairways.  Check any carpeting to make sure that it is firmly attached to the stairs. Fix any carpet that is loose or worn.  Avoid having throw rugs at the top or bottom of the stairs. If you do have throw rugs, attach them to the floor with carpet tape.  Make sure that you have a light switch at the top of the stairs and the bottom of the stairs. If you do not  have them, ask someone to add them for you. What else can I do to help prevent falls?  Wear shoes that:  Do not have high heels.  Have rubber bottoms.  Are comfortable and fit you well.  Are closed at the toe. Do not wear sandals.  If you use a stepladder:  Make sure that it is fully opened. Do not climb a closed stepladder.  Make sure that both sides of the stepladder are locked into place.  Ask someone to hold it for you, if possible.  Clearly mark and make sure that you can see:  Any grab bars or handrails.  First and last steps.  Where the edge of each step is.  Use tools that help you move around (mobility aids) if they are needed. These include:  Canes.  Walkers.  Scooters.  Crutches.  Turn on the lights when you go into a dark area. Replace any light bulbs as soon as they burn out.  Set up your furniture so you have a clear path. Avoid moving your furniture around.  If any of your floors are uneven, fix them.  If there are any pets around you, be aware of where they are.  Review your medicines with your doctor. Some medicines can make you feel dizzy. This can increase your chance of falling. Ask your doctor what other things that you can do to help prevent falls. This information is not intended to replace advice given to you by your health care provider. Make sure you discuss any questions you have with your health care provider. Document Released: 11/25/2008 Document Revised: 07/07/2015 Document Reviewed: 03/05/2014 Elsevier Interactive Patient Education  2017 [employer].

## 2020-08-09 ENCOUNTER — Other Ambulatory Visit: Payer: Self-pay

## 2020-08-09 ENCOUNTER — Ambulatory Visit (INDEPENDENT_AMBULATORY_CARE_PROVIDER_SITE_OTHER): Payer: Medicare HMO

## 2020-08-09 ENCOUNTER — Ambulatory Visit (INDEPENDENT_AMBULATORY_CARE_PROVIDER_SITE_OTHER): Payer: Medicare HMO | Admitting: Family

## 2020-08-09 ENCOUNTER — Encounter: Payer: Self-pay | Admitting: Family

## 2020-08-09 VITALS — BP 122/80 | HR 101 | Temp 98.1°F | Ht 70.0 in | Wt 209.8 lb

## 2020-08-09 DIAGNOSIS — W540XXA Bitten by dog, initial encounter: Secondary | ICD-10-CM | POA: Diagnosis not present

## 2020-08-09 DIAGNOSIS — S61451A Open bite of right hand, initial encounter: Secondary | ICD-10-CM

## 2020-08-09 MED ORDER — DOXYCYCLINE HYCLATE 100 MG PO TABS: 100.0000 mg | ORAL_TABLET | Freq: Two times a day (BID) | ORAL | 0 refills | Status: DC

## 2020-08-09 NOTE — Patient Instructions (Signed)
Animal Bite, Adult Animal bites range from mild to serious. An animal bite can result in any of these injuries: A scratch. A deep, open cut. A puncture of the skin. A crush injury. Tearing away of the skin or a body part. A bone injury. A small bite from a house pet is usually less serious than a bite from a stray or wild animal, such as a raccoon, fox, skunk, or bat. That is because stray and wild animals have a higher risk of carrying a serious infection calledrabies, which can be passed to humans through a bite. What increases the risk? You are more likely to be bitten by an animal if: You are around unfamiliar pets. You disturb an animal when it is eating, sleeping, or caring for its babies. You are outdoors in a place where small, wild animals roam freely. What are the signs or symptoms? Common symptoms of an animal bite include: Pain. Bleeding. Swelling. Bruising. How is this diagnosed? This condition may be diagnosed based on a physical exam and medical history. Your health care provider will examine your wound and ask for details about the animal and how the bite happened. You may also have tests, such as: Blood tests to check for infection. X-rays to check for damage to bones or joints. Taking a fluid sample from your wound and checking it for infection (culture test). How is this treated? Treatment varies depending on the type of animal, where the bite is on your body, and your medical history. Treatment may include: Caring for the wound. This often includes cleaning the wound, rinsing out (flushing) the wound with saline solution, and applying a bandage (dressing). In some cases, the wound may be closed with stitches (sutures), staples, skin glue, or adhesive strips. Antibiotic medicine to prevent or treat infection. This medicine may be prescribed in pill or ointment form. If the bite area becomes infected, the medicine may be given through an IV. A tetanus shot to prevent  tetanus infection. Rabies treatment to prevent rabies infection. This will be done if the animal could have rabies. Surgery. This may be done if a bite gets infected or if there is damage that needs to be repaired. Follow these instructions at home: Wound care  Follow instructions from your health care provider about how to take care of your wound. Make sure you: Wash your hands with soap and water before you change your dressing. If soap and water are not available, use hand sanitizer. Change your dressing as told by your health care provider. Leave sutures, skin glue, or adhesive strips in place. These skin closures may need to stay in place for 2 weeks or longer. If adhesive strip edges start to loosen and curl up, you may trim the loose edges. Do not remove adhesive strips completely unless your health care provider tells you to do that. Check your wound every day for signs of infection. Check for: More redness, swelling, or pain. More fluid or blood. Warmth. Pus or a bad smell.  Medicines Take or apply over-the-counter and prescription medicines only as told by your health care provider. If you were prescribed an antibiotic, take or apply it as told by your health care provider. Do not stop using the antibiotic even if your condition improves. General instructions  Keep the injured area raised (elevated) above the level of your heart while you are sitting or lying down, if this is possible. If directed, put ice on the injured area. Put ice in a plastic  bag. Place a towel between your skin and the bag. Leave the ice on for 20 minutes, 2-3 times per day. Keep all follow-up visits as told by your health care provider. This is important.  Contact a health care provider if: You have more redness, swelling, or pain around your wound. Your wound feels warm to the touch. You have a fever or chills. You have a general feeling of sickness (malaise). You feel nauseous or you vomit. You  have pain that does not get better. Get help right away if: You have a red streak that leads away from your wound. You have non-clear fluid or more blood coming from your wound. There is pus or a bad smell coming from your wound. You have trouble moving your injured area. You have numbness or tingling that extends beyond the wound. Summary Animal bites can range from mild to serious. An animal bite can cause a scratch on the skin, a deep open cut, a puncture of the skin, a crush injury, tearing away of the skin or a body part, or a bone injury. Your health care provider will examine your wound and ask for details about the animal and how the bite happened. You may also have tests such as a blood test, X-ray, or testing of a fluid sample from your wound (culture test). Treatment may include wound care, antibiotic medicine, a tetanus shot, and rabies treatment if the animal could have rabies. This information is not intended to replace advice given to you by your health care provider. Make sure you discuss any questions you have with your healthcare provider. Document Revised: 11/24/2019 Document Reviewed: 11/24/2019 Elsevier Patient Education  2022 [employer].

## 2020-08-09 NOTE — Progress Notes (Signed)
   Subjective:    Patient ID: Alison Garrett, female    DOB: 1945/09/02, 75 y.o.   MRN: [id number]  Chief Complaint  Patient presents with   Hand Injury    Dog bite last Monday. [facility] [employer]     Hand Injury   Pt presents to the office today with right hand swelling that started last week after getting bite by a dog.   She works at the [employer] and was bite last week. States it was red, but improved. Now the puncture wound has resolved, but the area is red, warm, tender, and swollen.   She reports her pain is constant of 6 out 10 that hurts more when she uses it.    Review of Systems  All other systems reviewed and are negative.     Objective:   Physical Exam Vitals reviewed.  Constitutional:      General: She is not in acute distress.    Appearance: She is well-developed.  HENT:     Head: Normocephalic and atraumatic.  Eyes:     Pupils: Pupils are equal, round, and reactive to light.  Neck:     Thyroid: No thyromegaly.  Cardiovascular:     Rate and Rhythm: Normal rate and regular rhythm.     Heart sounds: Normal heart sounds. No murmur heard. Pulmonary:     Effort: Pulmonary effort is normal. No respiratory distress.     Breath sounds: Normal breath sounds. No wheezing.  Abdominal:     General: Bowel sounds are normal. There is no distension.     Palpations: Abdomen is soft.     Tenderness: There is no abdominal tenderness.  Musculoskeletal:        General: Swelling and tenderness present.     Cervical back: Normal range of motion and neck supple.     Comments: Right swelling and tenderness of knuckles. Warmth present.   Skin:    General: Skin is warm and dry.       Neurological:     Mental Status: She is alert and oriented to person, place, and time.     Cranial Nerves: No cranial nerve deficit.     Deep Tendon Reflexes: Reflexes are normal and symmetric.  Psychiatric:        Behavior: Behavior normal.        Thought Content: Thought content normal.         Judgment: Judgment normal.      BP 122/80   Pulse (!) 101   Temp 98.1 F (36.7 C) (Temporal)   Ht 5\' 10"  (1.778 m)   Wt 209 lb 12.8 oz (95.2 kg)   BMI 30.10 kg/m      Assessment & Plan:  Alison Garrett comes in today with chief complaint of Hand Injury (Dog bite last Monday. At [employer] )   Diagnosis and orders addressed:  1. Dog bite of right hand, initial encounter Start doxycyline  Will get TDAP at [facility] marked, if area worsens call office asap or go to ED.  Worrisome that infection will spread to bone. - doxycycline (VIBRA-TABS) 100 MG tablet; Take 1 tablet (100 mg total) by mouth 2 (two) times daily.  Dispense: 20 tablet; Refill: 0 - DG Hand Complete Right; Future   Evelina Dun, FNP

## 2020-08-16 ENCOUNTER — Ambulatory Visit: Payer: Medicare HMO

## 2020-08-23 ENCOUNTER — Other Ambulatory Visit: Payer: Self-pay

## 2020-08-23 ENCOUNTER — Ambulatory Visit (INDEPENDENT_AMBULATORY_CARE_PROVIDER_SITE_OTHER): Payer: Medicare HMO | Admitting: *Deleted

## 2020-08-23 DIAGNOSIS — Z23 Encounter for immunization: Secondary | ICD-10-CM

## 2020-08-31 DIAGNOSIS — H5200 Hypermetropia, unspecified eye: Secondary | ICD-10-CM | POA: Diagnosis not present

## 2020-08-31 DIAGNOSIS — Z0100 Encounter for examination of eyes and vision without abnormal findings: Secondary | ICD-10-CM | POA: Diagnosis not present

## 2020-08-31 DIAGNOSIS — I10 Essential (primary) hypertension: Secondary | ICD-10-CM | POA: Diagnosis not present

## 2021-04-01 ENCOUNTER — Other Ambulatory Visit: Payer: Self-pay

## 2021-04-01 ENCOUNTER — Encounter (HOSPITAL_COMMUNITY): Payer: Self-pay | Admitting: Emergency Medicine

## 2021-04-01 ENCOUNTER — Emergency Department (HOSPITAL_COMMUNITY): Payer: Medicare HMO

## 2021-04-01 ENCOUNTER — Emergency Department (HOSPITAL_COMMUNITY)
Admission: EM | Admit: 2021-04-01 | Discharge: 2021-04-01 | Disposition: A | Discharge status: Home / Self Care | Payer: Medicare HMO | Attending: Emergency Medicine | Admitting: Emergency Medicine

## 2021-04-01 DIAGNOSIS — I10 Essential (primary) hypertension: Secondary | ICD-10-CM | POA: Insufficient documentation

## 2021-04-01 DIAGNOSIS — R531 Weakness: Secondary | ICD-10-CM | POA: Diagnosis not present

## 2021-04-01 DIAGNOSIS — Z91040 Latex allergy status: Secondary | ICD-10-CM | POA: Diagnosis not present

## 2021-04-01 DIAGNOSIS — R0789 Other chest pain: Secondary | ICD-10-CM | POA: Diagnosis not present

## 2021-04-01 DIAGNOSIS — R002 Palpitations: Secondary | ICD-10-CM | POA: Diagnosis not present

## 2021-04-01 DIAGNOSIS — Z79899 Other long term (current) drug therapy: Secondary | ICD-10-CM | POA: Insufficient documentation

## 2021-04-01 DIAGNOSIS — Z7982 Long term (current) use of aspirin: Secondary | ICD-10-CM | POA: Diagnosis not present

## 2021-04-01 DIAGNOSIS — R0602 Shortness of breath: Secondary | ICD-10-CM | POA: Diagnosis not present

## 2021-04-01 DIAGNOSIS — R079 Chest pain, unspecified: Secondary | ICD-10-CM | POA: Diagnosis not present

## 2021-04-01 LAB — TROPONIN I (HIGH SENSITIVITY)
Troponin I (High Sensitivity): 9 ng/L (ref <18)
Troponin I (High Sensitivity): 9 ng/L (ref <18)

## 2021-04-01 LAB — URINALYSIS, ROUTINE W REFLEX MICROSCOPIC
Bacteria, UA: NONE SEEN
Bilirubin Urine: NEGATIVE
Glucose, UA: NEGATIVE mg/dL
Hgb urine dipstick: NEGATIVE
Ketones, ur: NEGATIVE mg/dL
Nitrite: NEGATIVE
Protein, ur: NEGATIVE mg/dL
Specific Gravity, Urine: 1.006 (ref 1.005–1.030)
pH: 5 (ref 5.0–8.0)

## 2021-04-01 LAB — CBC
HCT: 44 % (ref 36.0–46.0)
Hemoglobin: 14.8 g/dL (ref 12.0–15.0)
MCH: 29.2 pg (ref 26.0–34.0)
MCHC: 33.6 g/dL (ref 30.0–36.0)
MCV: 87 fL (ref 80.0–100.0)
Platelets: 282 10*3/uL (ref 150–400)
RBC: 5.06 MIL/uL (ref 3.87–5.11)
RDW: 13.1 % (ref 11.5–15.5)
WBC: 8.2 10*3/uL (ref 4.0–10.5)
nRBC: 0 % (ref 0.0–0.2)

## 2021-04-01 LAB — MAGNESIUM: Magnesium: 1.9 mg/dL (ref 1.7–2.4)

## 2021-04-01 LAB — BASIC METABOLIC PANEL
Anion gap: 11 (ref 5–15)
BUN: 18 mg/dL (ref 8–23)
CO2: 29 mmol/L (ref 22–32)
Calcium: 8.9 mg/dL (ref 8.9–10.3)
Chloride: 101 mmol/L (ref 98–111)
Creatinine, Ser: 1.15 mg/dL — ABNORMAL HIGH (ref 0.44–1.00)
GFR, Estimated: 50 mL/min — ABNORMAL LOW (ref >60)
Glucose, Bld: 147 mg/dL — ABNORMAL HIGH (ref 70–99)
Potassium: 3.4 mmol/L — ABNORMAL LOW (ref 3.5–5.1)
Sodium: 141 mmol/L (ref 135–145)

## 2021-04-01 LAB — CBG MONITORING, ED: Glucose-Capillary: 155 mg/dL — ABNORMAL HIGH (ref 70–99)

## 2021-04-01 LAB — D-DIMER, QUANTITATIVE: D-Dimer, Quant: 0.51 ug/mL-FEU — ABNORMAL HIGH (ref 0.00–0.50)

## 2021-04-01 NOTE — Discharge Instructions (Addendum)
Please return to the ED with any new symptoms such as chest pain, shortness of breath, leg swelling, fast heart rate Please call your cardiologist on Monday and schedule an appointment for further evaluation

## 2021-04-01 NOTE — ED Triage Notes (Signed)
Patient here with chest pain that woke her from sleep.  She states she woke up and felt like her heart was racing.  She states that she has Afib, but has not been in afib since her ablation a few years ago.  No shortness of breath at rest, but she does with exertion.  Patient denies any nausea or vomiting.

## 2021-04-01 NOTE — ED Provider Notes (Addendum)
[facility] EMERGENCY DEPARTMENT Provider Note   CSN: [id number] Arrival date & time: 04/01/21  0253     History  Chief Complaint  Patient presents with   Chest Pain    Alison Garrett is a 76 y.o. female with medical history of A-fib status post ablation in 2018, fibromyalgia, GERD, hiatal hernia, hypertension.  Patient presents to ED for evaluation of chest pain that began 1 week ago.  Patient states that the pain comes and goes, denies any exertion or radiation of pain.  Patient states that the chest pain is described as a "tightness", the patient continues that it "feels like something is there".  Patient reports that the chest pain is alleviated most notably when she was up moving around volunteering for the [employer].  Patient denies any aggravating factors.  The patient is endorsing shortness of breath, fast heart rate, weakness, chest tightness.  Patient denies any nausea, vomiting, fevers, diarrhea, abdominal pain, lightheadedness, dizziness, loss of consciousness.   Chest Pain Associated symptoms: shortness of breath and weakness   Associated symptoms: no abdominal pain, no dizziness, no fever, no nausea and no vomiting       Home Medications Prior to Admission medications   Medication Sig Start Date End Date Taking? Authorizing Provider  aspirin 81 MG EC tablet Take 81 mg by mouth daily. Swallow whole.    [provider]  doxycycline (VIBRA-TABS) 100 MG tablet Take 1 tablet (100 mg total) by mouth 2 (two) times daily. 08/09/20   Evelina Dun A, FNP  hydrochlorothiazide (HYDRODIURIL) 25 MG tablet Take 1 tablet (25 mg total) by mouth daily. 05/11/20   Dettinger, Fransisca Kaufmann, MD      Allergies    Ciprofloxacin, Contrast media [iodinated contrast media], Diltiazem, Metoprolol, Penicillins, Nitrofuran derivatives, Sulfonamide derivatives, Benicar [olmesartan], Celebrex [celecoxib], Cymbalta [duloxetine hcl], Latex, Neurontin [gabapentin], Norvasc  [amlodipine besylate], Prozac [fluoxetine hcl], Xanax [alprazolam], Zoloft [sertraline hcl], and Zyprexa [olanzapine]    Review of Systems   Review of Systems  Constitutional:  Negative for chills and fever.  Respiratory:  Positive for chest tightness and shortness of breath.   Cardiovascular:  Negative for chest pain and leg swelling.  Gastrointestinal:  Negative for abdominal pain, diarrhea, nausea and vomiting.  Neurological:  Positive for weakness. Negative for dizziness, syncope and light-headedness.  All other systems reviewed and are negative.  Physical Exam Updated Vital Signs BP 124/77    Pulse 60    Temp 98 F (36.7 C)    Resp 16    Ht 5\' 10"  (1.778 m)    Wt 90.7 kg    SpO2 99%    BMI 28.70 kg/m  Physical Exam Vitals and nursing note reviewed.  Constitutional:      General: She is not in acute distress.    Appearance: She is not ill-appearing, toxic-appearing or diaphoretic.  HENT:     Head: Normocephalic and atraumatic.     Nose: Nose normal.     Mouth/Throat:     Mouth: Mucous membranes are moist.  Eyes:     Extraocular Movements: Extraocular movements intact.     Pupils: Pupils are equal, round, and reactive to light.  Neck:     Vascular: No JVD.  Cardiovascular:     Rate and Rhythm: Normal rate and regular rhythm.  Pulmonary:     Effort: Pulmonary effort is normal. No tachypnea.     Breath sounds: Normal breath sounds. No wheezing or rales.  Chest:  Chest wall: No mass, deformity or tenderness.  Abdominal:     General: Abdomen is flat.     Palpations: Abdomen is soft.     Tenderness: There is no abdominal tenderness.  Musculoskeletal:     Cervical back: Normal range of motion and neck supple.     Right lower leg: No edema.     Left lower leg: No edema.  Skin:    General: Skin is warm and dry.     Capillary Refill: Capillary refill takes less than 2 seconds.  Neurological:     Mental Status: She is alert and oriented to person, place, and time.     ED Results / Procedures / Treatments   Labs (all labs ordered are listed, but only abnormal results are displayed) Labs Reviewed  BASIC METABOLIC PANEL - Abnormal; Notable for the following components:      Result Value   Potassium 3.4 (*)    Glucose, Bld 147 (*)    Creatinine, Ser 1.15 (*)    GFR, Estimated 50 (*)    All other components within normal limits  URINALYSIS, ROUTINE W REFLEX MICROSCOPIC - Abnormal; Notable for the following components:   Color, Urine STRAW (*)    Leukocytes,Ua SMALL (*)    All other components within normal limits  D-DIMER, QUANTITATIVE - Abnormal; Notable for the following components:   D-Dimer, Quant 0.51 (*)    All other components within normal limits  CBG MONITORING, ED - Abnormal; Notable for the following components:   Glucose-Capillary 155 (*)    All other components within normal limits  CBC  MAGNESIUM  TROPONIN I (HIGH SENSITIVITY)  TROPONIN I (HIGH SENSITIVITY)    EKG EKG Interpretation  Date/Time:  Saturday April 01 2021 03:02:47 EST Ventricular Rate:  73 PR Interval:  150 QRS Duration: 90 QT Interval:  406 QTC Calculation: 447 R Axis:   23 Text Interpretation: Normal sinus rhythm Nonspecific T wave abnormality Abnormal ECG When compared with ECG of 22-Feb-2019 11:25, PREVIOUS ECG IS PRESENT Confirmed by Dene Gentry [id number]) on 04/01/2021 9:50:24 AM  Radiology DG Chest 2 View  Result Date: 04/01/2021 CLINICAL DATA:  Cardiac palpitations and chest pain for 1 week EXAM: CHEST - 2 VIEW COMPARISON:  02/22/2019 FINDINGS: Cardiac shadow is within normal limits. The lungs are well aerated bilaterally. Extrinsic EKG leads are noted bilaterally. No focal infiltrate or effusion is noted. Mild degenerative changes of the thoracic spine are seen. IMPRESSION: No active cardiopulmonary disease. Electronically Signed   By: Inez Catalina M.D.   On: 04/01/2021 03:49    Procedures Procedures    Medications Ordered in ED Medications -  No data to display  ED Course/ Medical Decision Making/ A&P Clinical Course as of 04/01/21 1011  Sat Apr 01, 2021  0933 D-Dimer, Quant(!): 0.51 [PM]    Clinical Course User Index [PM] Valarie Merino, MD                           Medical Decision Making Amount and/or Complexity of Data Reviewed Labs: ordered. Decision-making details documented in ED Course.   76 year old patient with history of A-fib presents to ED for evaluation of chest discomfort.  On examination, the patient is afebrile, not tachycardic, not hypoxic, clear lung sounds bilaterally, soft compressible abdomen.  The patient is nontoxic in appearance.  The patient is currently denying any chest discomfort, feelings of anxiety or heart racing.  Patient worked up utilizing following labs  and imaging studies interpreted by me: Troponin, magnesium, D-dimer, BMP, UA, CBC see, chest x-ray, EKG CBC: Unremarkable BMP: Unremarkable.  Patient has creatinine of 1. 15 which is her baseline. Trop: Resulted at 9.  Delta troponin 0 Mag: Unremarkable D-dimer: Resulted at 0.51.  Age-adjusted D-dimer score of 0.23.  Patient is PERC criteria and Wells criteria negative.  I suspect low probability or chance of PE at this time.  Patient is not tachycardic, not hypoxic, no pain with inspiration.  I discussed this finding with the patient, advised her that the age-adjusted measurement was 0.23 which is not elevated.  I had a shared decision-making conversation with the patient at this time and I stated that if she felt strongly about it we could pursue further imaging studies to rule out PE, however she stated that she did not feel that was necessary at this time. UA: Unremarkable CXR: No signs of consolidation, effusion.  No cardiomegaly.  No mediastinal widening. EKG: Shows sinus rhythm unchanged from previous.  There are no signs of A-fib on the monitor, P waves are present.  At this time, the patient's work-up has been reassuring.  I feel  that this patient is stable for discharge with close outpatient follow-up with cardiology.  I have discussed this with the patient and I requested that she call on Monday and make an appointment with her cardiologist for further evaluation.  The patient and her case has been discussed with my attending Dr. Francia Greaves who agrees with the current plan for management.  Patient provided with return precautions and she voices understanding.  The patient had all of her questions answered to her satisfaction.  The patient is agreeable to the plan to have follow-up with her cardiologist as outpatient. The patient is stable for discharge at this time.   Final Clinical Impression(s) / ED Diagnoses Final diagnoses:  Palpitation    Rx / DC Orders ED Discharge Orders     None           Lawana Chambers 04/01/21 1013    Valarie Merino, MD 04/01/21 [phone]

## 2021-04-01 NOTE — ED Provider Triage Note (Signed)
Emergency Medicine Provider Triage Evaluation Note  Alison Garrett , a 76 y.o. female  was evaluated in triage.  Pt complains of general fatigue over the past week.  Tonight she awoke at 1 AM with palpitations characterized by her heart rate beating faster.  Feels a "heaviness" in her central chest without associated pain.  No diaphoresis, jaw pain, vomiting, leg swelling, fevers.  No medications taken PTA for symptoms.  Not on chronic anticoagulation.  Hx of ablation for Afib.  Review of Systems  Positive: As above Negative: As above  Physical Exam  BP (!) 160/83 (BP Location: Right Arm)    Pulse 78    Temp (!) 97.2 F (36.2 C) (Oral)    Resp 20    SpO2 97%  Gen:   Awake, no distress   Resp:  Normal effort  MSK:   Moves extremities without difficulty  Other:  No BLE edema  Medical Decision Making  Medically screening exam initiated at 3:32 AM.  Appropriate orders placed.  Alison Garrett was informed that the remainder of the evaluation will be completed by another provider, this initial triage assessment does not replace that evaluation, and the importance of remaining in the ED until their evaluation is complete.  Fatigue, chest pain - pending CXR, labs. EKG reviewed and c/w NSR.   Alison Breach, PA-C 04/01/21 [phone]

## 2021-05-15 ENCOUNTER — Ambulatory Visit (INDEPENDENT_AMBULATORY_CARE_PROVIDER_SITE_OTHER): Payer: Medicare HMO | Admitting: Family Medicine

## 2021-05-15 ENCOUNTER — Encounter: Payer: Self-pay | Admitting: Family Medicine

## 2021-05-15 VITALS — BP 118/81 | HR 79 | Temp 96.8°F | Ht 70.0 in | Wt 213.2 lb

## 2021-05-15 DIAGNOSIS — I480 Paroxysmal atrial fibrillation: Secondary | ICD-10-CM

## 2021-05-15 DIAGNOSIS — N182 Chronic kidney disease, stage 2 (mild): Secondary | ICD-10-CM | POA: Diagnosis not present

## 2021-05-15 DIAGNOSIS — E7800 Pure hypercholesterolemia, unspecified: Secondary | ICD-10-CM | POA: Diagnosis not present

## 2021-05-15 DIAGNOSIS — I10 Essential (primary) hypertension: Secondary | ICD-10-CM

## 2021-05-15 MED ORDER — HYDROCHLOROTHIAZIDE 25 MG PO TABS: 25.0000 mg | ORAL_TABLET | Freq: Every day | ORAL | 3 refills | Status: DC

## 2021-05-15 NOTE — Progress Notes (Signed)
? ?BP 118/81   Pulse 79   Temp (!) 96.8 ?F (36 ?C) (Temporal)   Ht _0  (1.778 m)   Wt 213 lb 3.2 oz (96.7 kg)   SpO2 96%   BMI 30.59 kg/m?   ? ?Subjective:  ? ?Patient ID: Alison Garrett, female    DOB: April 27, 1945, 76 y.o.   MRN: [id number] ? ?HPI: ?Alison Garrett is a 76 y.o. female presenting on 05/15/2021 for Medical Management of Chronic Issues ? ? ?HPI ?A-fib ?Patient has seen cardiology for A-fib.  They recommended stronger anticoagulant but patient refused and only wants to take aspirin 81 mg.  She gets the occasional flutters or palpitations. ? ?Hypertension and CKD 2 ?Patient is currently on hydrochlorothiazide, and their blood pressure today is 118/81. Patient denies any lightheadedness or dizziness. Patient denies headaches, blurred vision, chest pains, shortness of breath, or weakness. Denies any side effects from medication and is content with current medication.  ? ?Hyperlipidemia ?Patient is coming in for recheck of his hyperlipidemia. The patient is currently taking no medicine. They deny any issues with myalgias or history of liver damage from it. They deny any focal numbness or weakness or chest pain.  ? ?Relevant past medical, surgical, family and social history reviewed and updated as indicated. Interim medical history since our last visit reviewed. ?Allergies and medications reviewed and updated. ? ?Review of Systems  ?Constitutional:  Negative for chills and fever.  ?Eyes:  Negative for visual disturbance.  ?Respiratory:  Negative for chest tightness and shortness of breath.   ?Cardiovascular:  Negative for chest pain and leg swelling.  ?Musculoskeletal:  Negative for back pain and gait problem.  ?Skin:  Negative for rash.  ?Neurological:  Negative for dizziness, light-headedness and headaches.  ?Psychiatric/Behavioral:  Negative for agitation and behavioral problems.   ?All other systems reviewed and are negative. ? ?Per HPI unless specifically indicated above ? ? ?Allergies as of 05/15/2021    ? ?   Reactions  ? Ciprofloxacin Shortness Of Breath, Rash  ? Contrast Media [iodinated Contrast Media] Anaphylaxis  ? Diltiazem Other (See Comments)  ? Pt. Had pain, jitters and felt terrible  ? Metoprolol Nausea And Vomiting  ? Penicillins Anaphylaxis  ? Has patient had a PCN reaction causing immediate rash, facial/tongue/throat swelling, SOB or lightheadedness with hypotension: Noyes ?Has patient had a PCN reaction causing severe rash involving mucus membranes or skin necrosis: Noyes ?Has patient had a PCN reaction that required hospitalization Nounknown ?Has patient had a PCN reaction occurring within the last 10 years: Nono ?If all of the above answers are "NO", then may proceed with Cephalo  ? Nitrofuran Derivatives Nausea And Vomiting  ? Sulfonamide Derivatives Hives  ? Benicar [olmesartan] Rash  ? Celebrex [celecoxib] Anxiety  ? Cymbalta [duloxetine Hcl] Itching  ? Latex Rash  ? Neurontin [gabapentin] Nausea Only  ? Norvasc [amlodipine Besylate] Swelling  ? Prozac [fluoxetine Hcl] Other (See Comments)  ? Xanax [alprazolam] Other (See Comments)  ? Zoloft [sertraline Hcl] Itching  ? Zyprexa [olanzapine] Other (See Comments)  ? Wt gain  ? ?  ? ?  ?Medication List  ?  ? ?  ? Accurate as of May 15, 2021  9:21 AM. If you have any questions, ask your nurse or doctor.  ?  ?  ? ?  ? ?STOP taking these medications   ? ?doxycycline 100 MG tablet ?Commonly known as: VIBRA-TABS ?Stopped by: Worthy Rancher, MD ?  ? ?  ? ?TAKE these  medications   ? ?aspirin 81 MG EC tablet ?Take 81 mg by mouth daily. Swallow whole. ?  ?hydrochlorothiazide 25 MG tablet ?Commonly known as: HYDRODIURIL ?Take 1 tablet (25 mg total) by mouth daily. ?  ? ?  ? ? ? ?Objective:  ? ?BP 118/81   Pulse 79   Temp (!) 96.8 ?F (36 ?C) (Temporal)   Ht _0  (1.778 m)   Wt 213 lb 3.2 oz (96.7 kg)   SpO2 96%   BMI 30.59 kg/m?   ?Wt Readings from Last 3 Encounters:  ?05/15/21 213 lb 3.2 oz (96.7 kg)  ?04/01/21 200 lb (90.7 kg)  ?08/09/20 209 lb  12.8 oz (95.2 kg)  ?  ?Physical Exam ?Vitals and nursing note reviewed.  ?Constitutional:   ?   General: She is not in acute distress. ?   Appearance: She is well-developed. She is not diaphoretic.  ?Eyes:  ?   Conjunctiva/sclera: Conjunctivae normal.  ?Cardiovascular:  ?   Rate and Rhythm: Normal rate and regular rhythm.  ?   Heart sounds: Normal heart sounds. No murmur heard. ?Pulmonary:  ?   Effort: Pulmonary effort is normal. No respiratory distress.  ?   Breath sounds: Normal breath sounds. No wheezing.  ?Musculoskeletal:     ?   General: No swelling or tenderness. Normal range of motion.  ?Skin: ?   General: Skin is warm and dry.  ?   Findings: No rash.  ?Neurological:  ?   Mental Status: She is alert and oriented to person, place, and time.  ?   Coordination: Coordination normal.  ?Psychiatric:     ?   Behavior: Behavior normal.  ? ? ? ? ?Assessment & Plan:  ? ?Problem List Items Addressed This Visit   ? ?  ? Cardiovascular and Mediastinum  ? HYPERTENSION, BENIGN  ? Relevant Medications  ? hydrochlorothiazide (HYDRODIURIL) 25 MG tablet  ? Other Relevant Orders  ? CBC with Differential/Platelet  ? CMP14+EGFR  ? Paroxysmal A-fib ([place])  ? Relevant Medications  ? hydrochlorothiazide (HYDRODIURIL) 25 MG tablet  ? Other Relevant Orders  ? CBC with Differential/Platelet  ?  ? Genitourinary  ? CKD (chronic kidney disease) stage 2, GFR 60-89 ml/min  ? Relevant Orders  ? CMP14+EGFR  ?  ? Other  ? Hyperlipemia - Primary  ? Relevant Medications  ? hydrochlorothiazide (HYDRODIURIL) 25 MG tablet  ? Other Relevant Orders  ? CMP14+EGFR  ? Lipid panel  ?  ?Patient has 81 mg aspirin, declines anticoagulation per cardiology.   ?Blood pressure looks good, no changes, will check blood work.  She will make her own mammogram appointment. ?Follow up plan: ?Return in about 6 months (around 11/14/2021), or if symptoms worsen or fail to improve, for Hypertension and A-fib and cholesterol. ? ?Counseling provided for all of the vaccine  components ?Orders Placed This Encounter  ?Procedures  ? CBC with Differential/Platelet  ? CMP14+EGFR  ? Lipid panel  ? ? ?Caryl Pina, MD ?[place] ?05/15/2021, 9:21 AM ? ? ? ? ?

## 2021-05-16 LAB — CBC WITH DIFFERENTIAL/PLATELET
Basophils Absolute: 0.1 10*3/uL (ref 0.0–0.2)
Basos: 1 %
EOS (ABSOLUTE): 0.1 10*3/uL (ref 0.0–0.4)
Eos: 1 %
Hematocrit: 45.3 % (ref 34.0–46.6)
Hemoglobin: 15.1 g/dL (ref 11.1–15.9)
Immature Grans (Abs): 0 10*3/uL (ref 0.0–0.1)
Immature Granulocytes: 0 %
Lymphocytes Absolute: 1.3 10*3/uL (ref 0.7–3.1)
Lymphs: 21 %
MCH: 28.7 pg (ref 26.6–33.0)
MCHC: 33.3 g/dL (ref 31.5–35.7)
MCV: 86 fL (ref 79–97)
Monocytes Absolute: 0.4 10*3/uL (ref 0.1–0.9)
Monocytes: 6 %
Neutrophils Absolute: 4.5 10*3/uL (ref 1.4–7.0)
Neutrophils: 71 %
Platelets: 265 10*3/uL (ref 150–450)
RBC: 5.26 x10E6/uL (ref 3.77–5.28)
RDW: 12.3 % (ref 11.7–15.4)
WBC: 6.3 10*3/uL (ref 3.4–10.8)

## 2021-05-16 LAB — LIPID PANEL
Chol/HDL Ratio: 5 ratio — ABNORMAL HIGH (ref 0.0–4.4)
Cholesterol, Total: 179 mg/dL (ref 100–199)
HDL: 36 mg/dL — ABNORMAL LOW (ref >39)
LDL Chol Calc (NIH): 109 mg/dL — ABNORMAL HIGH (ref 0–99)
Triglycerides: 197 mg/dL — ABNORMAL HIGH (ref 0–149)
VLDL Cholesterol Cal: 34 mg/dL (ref 5–40)

## 2021-05-16 LAB — CMP14+EGFR
ALT: 26 IU/L (ref 0–32)
AST: 27 IU/L (ref 0–40)
Albumin/Globulin Ratio: 1.2 (ref 1.2–2.2)
Albumin: 3.7 g/dL (ref 3.7–4.7)
Alkaline Phosphatase: 117 IU/L (ref 44–121)
BUN/Creatinine Ratio: 15 (ref 12–28)
BUN: 17 mg/dL (ref 8–27)
Bilirubin Total: 0.6 mg/dL (ref 0.0–1.2)
CO2: 28 mmol/L (ref 20–29)
Calcium: 9.8 mg/dL (ref 8.7–10.3)
Chloride: 99 mmol/L (ref 96–106)
Creatinine, Ser: 1.15 mg/dL — ABNORMAL HIGH (ref 0.57–1.00)
Globulin, Total: 3 g/dL (ref 1.5–4.5)
Glucose: 166 mg/dL — ABNORMAL HIGH (ref 70–99)
Potassium: 4.5 mmol/L (ref 3.5–5.2)
Sodium: 140 mmol/L (ref 134–144)
Total Protein: 6.7 g/dL (ref 6.0–8.5)
eGFR: 50 mL/min/{1.73_m2} — ABNORMAL LOW (ref >59)

## 2021-05-31 ENCOUNTER — Ambulatory Visit: Payer: Medicare HMO

## 2021-06-08 ENCOUNTER — Ambulatory Visit (INDEPENDENT_AMBULATORY_CARE_PROVIDER_SITE_OTHER): Payer: Medicare HMO

## 2021-06-08 VITALS — Wt 213.0 lb

## 2021-06-08 DIAGNOSIS — Z0000 Encounter for general adult medical examination without abnormal findings: Secondary | ICD-10-CM

## 2021-06-08 NOTE — Patient Instructions (Signed)
Alison Garrett , ?Thank you for taking time to come for your Medicare Wellness Visit. I appreciate your ongoing commitment to your health goals. Please review the following plan we discussed and let me know if I can assist you in the future.  ? ?Screening recommendations/referrals: ?Colonoscopy: Routine cologuard with [employer] - have them send [place] results ?Mammogram: Done 03/30/2020 - reschedule appointment soon with [facility] ?Bone Density: Done 05/23/2016 - Repeat in 5 years *due at next visit in our office ?Recommended yearly ophthalmology/optometry visit for glaucoma screening and checkup ?Recommended yearly dental visit for hygiene and checkup ? ?Vaccinations: declines all vaccines ?Influenza vaccine: recommended every fall ?Pneumococcal vaccine: recommend once per lifetime [id number] ?Tdap vaccine: Done 08/23/2020 - Repeat in 10 years ?Shingles vaccine: recommended - Shingrix is 2 doses 2-6 months apart and over 90% effective     ?Covid-19: recommend 2 doses one month apart with a booster 6 months later ? ?Advanced directives: Advance directive discussed with you today. Even though you declined this today, please call our office should you change your mind, and we can give you the proper paperwork for you to fill out.  ? ?Conditions/risks identified: Aim for 30 minutes of exercise or brisk walking, 6-8 glasses of water, and 5 servings of fruits and vegetables each day.  ? ?Next appointment: Follow up in one year for your annual wellness visit  ? ? ?Preventive Care 50 Years and Older, Female ?Preventive care refers to lifestyle choices and visits with your health care provider that can promote health and wellness. ?What does preventive care include? ?A yearly physical exam. This is also called an annual well check. ?Dental exams once or twice a year. ?Routine eye exams. Ask your health care provider how often you should have your eyes checked. ?Personal lifestyle choices, including: ?Daily care of your teeth and  gums. ?Regular physical activity. ?Eating a healthy diet. ?Avoiding tobacco and drug use. ?Limiting alcohol use. ?Practicing safe sex. ?Taking low-dose aspirin every day. ?Taking vitamin and mineral supplements as recommended by your health care provider. ?What happens during an annual well check? ?The services and screenings done by your health care provider during your annual well check will depend on your age, overall health, lifestyle risk factors, and family history of disease. ?Counseling  ?Your health care provider may ask you questions about your: ?Alcohol use. ?Tobacco use. ?Drug use. ?Emotional well-being. ?Home and relationship well-being. ?Sexual activity. ?Eating habits. ?History of falls. ?Memory and ability to understand (cognition). ?Work and work Statistician. ?Reproductive health. ?Screening  ?You may have the following tests or measurements: ?Height, weight, and BMI. ?Blood pressure. ?Lipid and cholesterol levels. These may be checked every 5 years, or more frequently if you are over 61 years old. ?Skin check. ?Lung cancer screening. You may have this screening every year starting at age 40 if you have a 30-pack-year history of smoking and currently smoke or have quit within the past 15 years. ?Fecal occult blood test (FOBT) of the stool. You may have this test every year starting at age 67. ?Flexible sigmoidoscopy or colonoscopy. You may have a sigmoidoscopy every 5 years or a colonoscopy every 10 years starting at age 6. ?Hepatitis C blood test. ?Hepatitis B blood test. ?Sexually transmitted disease (STD) testing. ?Diabetes screening. This is done by checking your blood sugar (glucose) after you have not eaten for a while (fasting). You may have this done every 1-3 years. ?Bone density scan. This is done to screen for osteoporosis. You may have this done  starting at age 15. ?Mammogram. This may be done every 1-2 years. Talk to your health care provider about how often you should have regular  mammograms. ?Talk with your health care provider about your test results, treatment options, and if necessary, the need for more tests. ?Vaccines  ?Your health care provider may recommend certain vaccines, such as: ?Influenza vaccine. This is recommended every year. ?Tetanus, diphtheria, and acellular pertussis (Tdap, Td) vaccine. You may need a Td booster every 10 years. ?Zoster vaccine. You may need this after age 59. ?Pneumococcal 13-valent conjugate (PCV13) vaccine. One dose is recommended after age 21. ?Pneumococcal polysaccharide (PPSV23) vaccine. One dose is recommended after age 7. ?Talk to your health care provider about which screenings and vaccines you need and how often you need them. ?This information is not intended to replace advice given to you by your health care provider. Make sure you discuss any questions you have with your health care provider. ?Document Released: 02/25/2015 Document Revised: 10/19/2015 Document Reviewed: 11/30/2014 ?Elsevier Interactive Patient Education ? 2017 [employer]. ? ?Fall Prevention in the Home ?Falls can cause injuries. They can happen to people of all ages. There are many things you can do to make your home safe and to help prevent falls. ?What can I do on the outside of my home? ?Regularly fix the edges of walkways and driveways and fix any cracks. ?Remove anything that might make you trip as you walk through a door, such as a raised step or threshold. ?Trim any bushes or trees on the path to your home. ?Use bright outdoor lighting. ?Clear any walking paths of anything that might make someone trip, such as rocks or tools. ?Regularly check to see if handrails are loose or broken. Make sure that both sides of any steps have handrails. ?Any raised decks and porches should have guardrails on the edges. ?Have any leaves, snow, or ice cleared regularly. ?Use sand or salt on walking paths during winter. ?Clean up any spills in your garage right away. This includes oil  or grease spills. ?What can I do in the bathroom? ?Use night lights. ?Install grab bars by the toilet and in the tub and shower. Do not use towel bars as grab bars. ?Use non-skid mats or decals in the tub or shower. ?If you need to sit down in the shower, use a plastic, non-slip stool. ?Keep the floor dry. Clean up any water that spills on the floor as soon as it happens. ?Remove soap buildup in the tub or shower regularly. ?Attach bath mats securely with double-sided non-slip rug tape. ?Do not have throw rugs and other things on the floor that can make you trip. ?What can I do in the bedroom? ?Use night lights. ?Make sure that you have a light by your bed that is easy to reach. ?Do not use any sheets or blankets that are too big for your bed. They should not hang down onto the floor. ?Have a firm chair that has side arms. You can use this for support while you get dressed. ?Do not have throw rugs and other things on the floor that can make you trip. ?What can I do in the kitchen? ?Clean up any spills right away. ?Avoid walking on wet floors. ?Keep items that you use a lot in easy-to-reach places. ?If you need to reach something above you, use a strong step stool that has a grab bar. ?Keep electrical cords out of the way. ?Do not use floor polish or wax that  makes floors slippery. If you must use wax, use non-skid floor wax. ?Do not have throw rugs and other things on the floor that can make you trip. ?What can I do with my stairs? ?Do not leave any items on the stairs. ?Make sure that there are handrails on both sides of the stairs and use them. Fix handrails that are broken or loose. Make sure that handrails are as long as the stairways. ?Check any carpeting to make sure that it is firmly attached to the stairs. Fix any carpet that is loose or worn. ?Avoid having throw rugs at the top or bottom of the stairs. If you do have throw rugs, attach them to the floor with carpet tape. ?Make sure that you have a light  switch at the top of the stairs and the bottom of the stairs. If you do not have them, ask someone to add them for you. ?What else can I do to help prevent falls? ?Wear shoes that: ?Do not have high heels. ?Have rubber

## 2021-06-08 NOTE — Progress Notes (Signed)
? ?Subjective:  ? Alison Garrett is a 76 y.o. female who presents for Medicare Annual (Subsequent) preventive examination. ? ?Virtual Visit via Telephone Note ? ?I connected with  Alison Garrett on 06/08/21 at 12:00 PM EDT by telephone and verified that I am speaking with the correct person using two identifiers. ? ?Location: ?Patient: Home ?Provider: [facility] ?Persons participating in the virtual visit: patient/Nurse Health Advisor ?  ?I discussed the limitations, risks, security and privacy concerns of performing an evaluation and management service by telephone and the availability of in person appointments. The patient expressed understanding and agreed to proceed. ? ?Interactive audio and video telecommunications were attempted between this nurse and patient, however failed, due to patient having technical difficulties OR patient did not have access to video capability.  We continued and completed visit with audio only. ? ?Some vital signs may be absent or patient reported.  ? ?Aimie Wagman Dionne Ano, LPN  ? ?Review of Systems    ? ?Cardiac Risk Factors include: advanced age (>15mn, >>71women);obesity (BMI >30kg/m2);hypertension;dyslipidemia;Other (see comment), Risk factor comments: A.Fib, hx of P.E. ? ?   ?Objective:  ?  ?Today's Vitals  ? 06/08/21 1158  ?Weight: 213 lb (96.6 kg)  ? ?Body mass index is 30.56 kg/m?. ? ? ?  06/08/2021  ? 12:03 PM 04/01/2021  ?  7:59 AM 05/26/2020  ?  1:04 PM 03/23/2019  ?  2:58 PM 02/22/2019  ? 12:44 PM 12/19/2018  ?  7:25 AM 11/26/2018  ?  9:38 AM  ?Advanced Directives  ?Does Patient Have a Medical Advance Directive? No No No No No No No  ?Would patient like information on creating a medical advance directive? No - Patient declined  Yes (MAU/Ambulatory/Procedural Areas - Information given)  No - Patient declined No - Patient declined No - Patient declined  ? ? ?Current Medications (verified) ?Outpatient Encounter Medications as of 06/08/2021  ?Medication Sig  ? aspirin 81 MG EC tablet Take 81 mg  by mouth daily. Swallow whole.  ? hydrochlorothiazide (HYDRODIURIL) 25 MG tablet Take 1 tablet (25 mg total) by mouth daily.  ? ?No facility-administered encounter medications on file as of 06/08/2021.  ? ? ?Allergies (verified) ?Ciprofloxacin, Contrast media [iodinated contrast media], Diltiazem, Metoprolol, Penicillins, Nitrofuran derivatives, Sulfonamide derivatives, Benicar [olmesartan], Celebrex [celecoxib], Cymbalta [duloxetine hcl], Latex, Neurontin [gabapentin], Norvasc [amlodipine besylate], Prozac [fluoxetine hcl], Xanax [alprazolam], Zoloft [sertraline hcl], and Zyprexa [olanzapine]  ? ?History: ?Past Medical History:  ?Diagnosis Date  ? [place]ib ([place]   ? Complication of anesthesia   ? propathal   ? Dyslipidemia   ? Essential tremor 01/12/2014  ? Fibromyalgia   ? Not currently under active treatment  ? GERD (gastroesophageal reflux disease)   ? Hiatal hernia   ? Hypertension   ? Obesity   ? ?Past Surgical History:  ?Procedure Laterality Date  ? ABDOMINAL HYSTERECTOMY    ? SVT ABLATION N/A 12/19/2018  ? Procedure: SVT ABLATION;  Surgeon: CConstance Haw MD;  Location: [facility];  Service: Cardiovascular;  Laterality: N/A;  ? ?Family History  ?Problem Relation Age of Onset  ? Coronary artery disease Mother   ? Heart disease Mother   ?     CABG  ? Cancer Mother   ?     trachea  ? COPD Father   ? Hepatitis Sister   ?     autoimune  ? Diabetes Sister   ? ?Social History  ? ?Socioeconomic History  ? Marital status: Divorced  ?  Spouse name: Not on file  ? Number of children: 3  ? Years of education: some college  ? Highest education level: Some college, no degree  ?Occupational History  ?  Employer: [employer]   ?  Comment: full time  ?Tobacco Use  ? Smoking status: Never  ? Smokeless tobacco: Never  ?Vaping Use  ? Vaping Use: Never used  ?Substance and Sexual Activity  ? Alcohol use: No  ?  Alcohol/week: 0.0 standard drinks  ? Drug use: No  ? Sexual activity: Not Currently  ?Other Topics  Concern  ? Not on file  ?Social History Narrative  ? Lives in [place].  ? Patient is right handed  ? Drinks very little caffeine.  ? Works every day but Wednesday and Sunday  ? Lives alone.  ? ?Social Determinants of Health  ? ?Financial Resource Strain: Low Risk   ? Difficulty of Paying Living Expenses: Not hard at all  ?Food Insecurity: No Food Insecurity  ? Worried About Charity fundraiser in the Last Year: Never true  ? Ran Out of Food in the Last Year: Never true  ?Transportation Needs: No Transportation Needs  ? Lack of Transportation (Medical): No  ? Lack of Transportation (Non-Medical): No  ?Physical Activity: Sufficiently Active  ? Days of Exercise per Week: 5 days  ? Minutes of Exercise per Session: 60 min  ?Stress: No Stress Concern Present  ? Feeling of Stress : Not at all  ?Social Connections: Socially Isolated  ? Frequency of Communication with Friends and Family: More than three times a week  ? Frequency of Social Gatherings with Friends and Family: More than three times a week  ? Attends Religious Services: Never  ? Active Member of Clubs or Organizations: No  ? Attends Archivist Meetings: Never  ? Marital Status: Divorced  ? ? ?Tobacco Counseling ?Counseling given: Not Answered ? ? ?Clinical Intake: ? ?Pre-visit preparation completed: Yes ? ?Pain : No/denies pain ? ?  ? ?BMI - recorded: 30.56 ?Nutritional Status: BMI > 30  Obese ?Nutritional Risks: None ?Diabetes: No ? ?How often do you need to have someone help you when you read instructions, pamphlets, or other written materials from your doctor or pharmacy?: 1 - Never ? ?Diabetic? no ? ?Interpreter Needed?: No ? ?Information entered by :: Aris Even, LPN ? ? ?Activities of Daily Living ? ?  06/08/2021  ? 12:03 PM  ?In your present state of health, do you have any difficulty performing the following activities:  ?Hearing? 0  ?Vision? 0  ?Difficulty concentrating or making decisions? 0  ?Walking or climbing stairs? 1  ?Comment If she  has something to hold onto  ?Dressing or bathing? 0  ?Doing errands, shopping? 0  ?Preparing Food and eating ? N  ?Using the Toilet? N  ?In the past six months, have you accidently leaked urine? N  ?Do you have problems with loss of bowel control? N  ?Managing your Medications? N  ?Managing your Finances? N  ?Housekeeping or managing your Housekeeping? N  ? ? ?Patient Care Team: ?Dettinger, Fransisca Kaufmann, MD as PCP - General (Family Medicine) ?Minus Breeding, MD as PCP - Cardiology (Cardiology) ?Ilean China, RN as Equities trader ?Celestia Khat, OD (Optometry) ? ?Indicate any recent Medical Services you may have received from other than [facility] providers in the past year (date may be approximate). ? ?   ?Assessment:  ? This is a routine wellness examination for Thamar. ? ?Hearing/Vision screen ?  Hearing Screening - Comments:: Denies hearing difficulties   ?Vision Screening - Comments:: Wears rx glasses - up to date with routine eye exams with [facility] ? ?Dietary issues and exercise activities discussed: ?Current Exercise Habits: The patient has a physically strenuous job, but has no regular exercise apart from work., Type of exercise: walking, Time (Minutes): 30, Frequency (Times/Week): 5, Weekly Exercise (Minutes/Week): 150, Intensity: Mild, Exercise limited by: cardiac condition(s) ? ? Goals Addressed   ? ?  ?  ?  ?  ? This Visit's Progress  ?  DIET - INCREASE WATER INTAKE   On track  ?  Try to drink 6-8 glasses of water daily. ?  ?  Exercise 150 min/wk Moderate Activity   On track  ?  Goals Addressed   ? ?  ?  ?  ?  ? This Visit's Progress  ?  DIET - INCREASE WATER INTAKE   On track  ?  Try to drink 6-8 glasses of water daily. ?  ?  Exercise 150 min/wk Moderate Activity   On track  ? ?  ? ? ?  ? ?  ? ?Depression Screen ? ?  06/08/2021  ? 12:01 PM 05/15/2021  ?  9:07 AM 08/09/2020  ?  1:59 PM 05/26/2020  ?  1:04 PM 05/11/2020  ?  9:45 AM 03/28/2020  ? 10:55 AM 10/14/2019  ?  9:31 AM  ?PHQ 2/9 Scores  ?PHQ - 2 Score 0 0 0  0 0 0 0  ?PHQ- 9 Score 4 7       ?  ?Fall Risk ? ?  06/08/2021  ? 11:59 AM 05/15/2021  ?  9:07 AM 08/09/2020  ?  1:59 PM 05/26/2020  ?  1:04 PM 05/11/2020  ?  9:45 AM  ?Fall Risk   ?Falls in the past year

## 2021-06-19 DIAGNOSIS — L720 Epidermal cyst: Secondary | ICD-10-CM | POA: Diagnosis not present

## 2021-09-06 DIAGNOSIS — H43813 Vitreous degeneration, bilateral: Secondary | ICD-10-CM | POA: Diagnosis not present

## 2021-09-06 DIAGNOSIS — H2513 Age-related nuclear cataract, bilateral: Secondary | ICD-10-CM | POA: Diagnosis not present

## 2021-09-06 DIAGNOSIS — H16142 Punctate keratitis, left eye: Secondary | ICD-10-CM | POA: Diagnosis not present

## 2021-09-20 ENCOUNTER — Ambulatory Visit: Payer: Self-pay | Admitting: *Deleted

## 2021-09-20 NOTE — Patient Instructions (Signed)
Alison Garrett  At some point during the past 4 years, I have worked with you through the [facility] Program (CCM) at [place]. We have not worked together within the past 6 months.   Due to program changes I am removing myself from your care team.   If you are currently active with another CCM Team Member, you will remain active with them unless they reach out to you with additional information.   If you feel that you need services in the future,  please talk with your primary care provider and request a new referral for Care Management or Care Coordination services. This does not affect your status as a patient at [facility].   Thank you for allowing me to participate in your your healthcare journey.  Chong Sicilian, BSN, RN-BC Proofreader Dial: [phone]

## 2021-09-20 NOTE — Chronic Care Management (AMB) (Signed)
  Chronic Care Management   Note  09/20/2021 Name: Alison Garrett MRN: [id number] DOB: 1945/07/29   Due to changes in the Chronic Care Management program, I am removing myself as the [place] from the Care Team and closing any RN Care Management Care Plans. The patient has not worked with the Consulting civil engineer within the past 6 months. Patient was not scheduled to be followed by the RN Care Coordination nurse for [facility].   Patient does not have an open Care Plan with another CCM team member. If there are open care plans with other team members, I will forward this case closure encounter to them as notification of my case closure. Patient does not have a current CCM referral placed since 06/12/21. CCM enrollment status changed to "not enrolled".   Patient's PCP can place a new referral if the they needs Care Management or Care Coordination services in the future.  Chong Sicilian, BSN, RN-BC Proofreader Dial: [phone]

## 2021-10-11 DIAGNOSIS — H52229 Regular astigmatism, unspecified eye: Secondary | ICD-10-CM | POA: Diagnosis not present

## 2021-10-11 DIAGNOSIS — Z0100 Encounter for examination of eyes and vision without abnormal findings: Secondary | ICD-10-CM | POA: Diagnosis not present

## 2021-11-15 ENCOUNTER — Encounter: Payer: Self-pay | Admitting: Family Medicine

## 2021-11-15 ENCOUNTER — Ambulatory Visit (INDEPENDENT_AMBULATORY_CARE_PROVIDER_SITE_OTHER): Payer: Medicare HMO | Admitting: Family Medicine

## 2021-11-15 VITALS — BP 141/83 | HR 80 | Temp 97.5°F | Ht 70.0 in | Wt 216.0 lb

## 2021-11-15 DIAGNOSIS — N182 Chronic kidney disease, stage 2 (mild): Secondary | ICD-10-CM

## 2021-11-15 DIAGNOSIS — R5383 Other fatigue: Secondary | ICD-10-CM | POA: Diagnosis not present

## 2021-11-15 DIAGNOSIS — I10 Essential (primary) hypertension: Secondary | ICD-10-CM | POA: Diagnosis not present

## 2021-11-15 DIAGNOSIS — E7800 Pure hypercholesterolemia, unspecified: Secondary | ICD-10-CM | POA: Diagnosis not present

## 2021-11-15 NOTE — Progress Notes (Signed)
BP (!) 141/83   Pulse 80   Temp (!) 97.5 F (36.4 C)   Ht 5' 10"  (1.778 m)   Wt 216 lb (98 kg)   SpO2 97%   BMI 30.99 kg/m    Subjective:   Patient ID: Alison Garrett, female    DOB: 06-10-1945, 76 y.o.   MRN: [id number]  HPI: Alison Garrett is a 76 y.o. female presenting on 11/15/2021 for Medical Management of Chronic Issues and Hypertension   HPI Hypertension Patient is currently on hydrochlorothiazide, and their blood pressure today is 141/83. Patient denies any lightheadedness or dizziness. Patient denies headaches, blurred vision, chest pains, shortness of breath, or weakness. Denies any side effects from medication and is content with current medication.   Hyperlipidemia Patient is coming in for recheck of his hyperlipidemia. The patient is currently taking no medication currently has been intolerant of statins and other cholesterol medicines. They deny any issues with myalgias or history of liver damage from it. They deny any focal numbness or weakness or chest pain.   CKD recheck Patient has mild stage II CKD, we are just monitoring at this point encouraging hydration.  We will recheck levels today she denies any symptoms from it  Patient's been feeling very fatigued and down and she feels like she has some irritation secondary to certain types of meat and Milks and she does have tick bites that she gets frequently and wants to see if she can be tested for red meat allergy and other tick bite illnesses.  She has not had any specific ones recently.  Relevant past medical, surgical, family and social history reviewed and updated as indicated. Interim medical history since our last visit reviewed. Allergies and medications reviewed and updated.  Review of Systems  Constitutional:  Negative for chills and fever.  Eyes:  Negative for visual disturbance.  Respiratory:  Negative for chest tightness and shortness of breath.   Cardiovascular:  Negative for chest pain and leg  swelling.  Musculoskeletal:  Negative for back pain and gait problem.  Skin:  Negative for rash.  Neurological:  Negative for dizziness, light-headedness and headaches.  Psychiatric/Behavioral:  Negative for agitation and behavioral problems.   All other systems reviewed and are negative.   Per HPI unless specifically indicated above   Allergies as of 11/15/2021       Reactions   Ciprofloxacin Shortness Of Breath, Rash   Contrast Media [iodinated Contrast Media] Anaphylaxis   Diltiazem Other (See Comments)   Pt. Had pain, jitters and felt terrible   Metoprolol Nausea And Vomiting   Penicillins Anaphylaxis   Has patient had a PCN reaction causing immediate rash, facial/tongue/throat swelling, SOB or lightheadedness with hypotension: Noyes Has patient had a PCN reaction causing severe rash involving mucus membranes or skin necrosis: Yesyes Has patient had a PCN reaction that required hospitalization unknown Has patient had a PCN reaction occurring within the last 10 years: Nono If all of the above answers are "NO", then may proceed with Cephalo   Nitrofuran Derivatives Nausea And Vomiting   Sulfonamide Derivatives Hives   Benicar [olmesartan] Rash   Celebrex [celecoxib] Anxiety   Cymbalta [duloxetine Hcl] Itching   Latex Rash   Neurontin [gabapentin] Nausea Only   Norvasc [amlodipine Besylate] Swelling   Prozac [fluoxetine Hcl] Other (See Comments)   Xanax [alprazolam] Other (See Comments)   Zoloft [sertraline Hcl] Itching   Zyprexa [olanzapine] Other (See Comments)   Wt gain  Medication List        Accurate as of November 15, 2021  9:07 AM. If you have any questions, ask your nurse or doctor.          aspirin EC 81 MG tablet Take 81 mg by mouth daily. Swallow whole.   hydrochlorothiazide 25 MG tablet Commonly known as: HYDRODIURIL Take 1 tablet (25 mg total) by mouth daily.         Objective:   BP (!) 141/83   Pulse 80   Temp (!) 97.5 F (36.4 C)    Ht 5' 10"  (1.778 m)   Wt 216 lb (98 kg)   SpO2 97%   BMI 30.99 kg/m   Wt Readings from Last 3 Encounters:  11/15/21 216 lb (98 kg)  06/08/21 213 lb (96.6 kg)  05/15/21 213 lb 3.2 oz (96.7 kg)    Physical Exam Vitals and nursing note reviewed.  Constitutional:      General: She is not in acute distress.    Appearance: She is well-developed. She is not diaphoretic.  Eyes:     Conjunctiva/sclera: Conjunctivae normal.  Cardiovascular:     Rate and Rhythm: Normal rate and regular rhythm.     Heart sounds: Normal heart sounds. No murmur heard. Pulmonary:     Effort: Pulmonary effort is normal. No respiratory distress.     Breath sounds: Normal breath sounds. No wheezing.  Musculoskeletal:        General: No swelling or tenderness. Normal range of motion.  Skin:    General: Skin is warm and dry.     Findings: No rash.  Neurological:     Mental Status: She is alert and oriented to person, place, and time.     Coordination: Coordination normal.  Psychiatric:        Behavior: Behavior normal.       Assessment & Plan:   Problem List Items Addressed This Visit       Cardiovascular and Mediastinum   HYPERTENSION, BENIGN - Primary   Relevant Orders   CBC with Differential/Platelet   CMP14+EGFR   Lipid panel     Genitourinary   CKD (chronic kidney disease) stage 2, GFR 60-89 ml/min   Relevant Orders   CBC with Differential/Platelet   CMP14+EGFR   Lipid panel     Other   Hyperlipemia   Relevant Orders   CBC with Differential/Platelet   CMP14+EGFR   Lipid panel   Other Visit Diagnoses     Decreased energy       Relevant Orders   CBC with Differential/Platelet   TSH   Alpha-Gal Panel   Rocky mtn spotted fvr abs pnl(IgG+IgM)   Lyme Disease Serology w/Reflex     Patient has a lot of fatigue and feels like her stomach gets irritated We will test for tickborne illnesses along with thyroid to see if there is any cause behind her fatigue.  We will do other  normal blood work today as well.  Blood pressure seems to be stable and running better at home in the 130s over 80s.  Continue to monitor.  Follow up plan: Return in about 6 months (around 05/17/2022), or if symptoms worsen or fail to improve, for Hypertension recheck.  Counseling provided for all of the vaccine components Orders Placed This Encounter  Procedures   CBC with Differential/Platelet   CMP14+EGFR   Lipid panel   TSH   Alpha-Gal Panel   Rocky mtn spotted fvr abs pnl(IgG+IgM)   Lyme Disease  Serology w/Reflex    Caryl Pina, MD [facility] 11/15/2021, 9:07 AM

## 2021-11-20 LAB — CMP14+EGFR
ALT: 38 IU/L — ABNORMAL HIGH (ref 0–32)
AST: 32 IU/L (ref 0–40)
Albumin/Globulin Ratio: 1.5 (ref 1.2–2.2)
Albumin: 4.1 g/dL (ref 3.8–4.8)
Alkaline Phosphatase: 121 IU/L (ref 44–121)
BUN/Creatinine Ratio: 14 (ref 12–28)
BUN: 16 mg/dL (ref 8–27)
Bilirubin Total: 0.7 mg/dL (ref 0.0–1.2)
CO2: 27 mmol/L (ref 20–29)
Calcium: 9.7 mg/dL (ref 8.7–10.3)
Chloride: 99 mmol/L (ref 96–106)
Creatinine, Ser: 1.18 mg/dL — ABNORMAL HIGH (ref 0.57–1.00)
Globulin, Total: 2.7 g/dL (ref 1.5–4.5)
Glucose: 121 mg/dL — ABNORMAL HIGH (ref 70–99)
Potassium: 4.7 mmol/L (ref 3.5–5.2)
Sodium: 141 mmol/L (ref 134–144)
Total Protein: 6.8 g/dL (ref 6.0–8.5)
eGFR: 48 mL/min/{1.73_m2} — ABNORMAL LOW (ref >59)

## 2021-11-20 LAB — CBC WITH DIFFERENTIAL/PLATELET
Basophils Absolute: 0.1 10*3/uL (ref 0.0–0.2)
Basos: 1 %
EOS (ABSOLUTE): 0.1 10*3/uL (ref 0.0–0.4)
Eos: 2 %
Hematocrit: 43.2 % (ref 34.0–46.6)
Hemoglobin: 15.1 g/dL (ref 11.1–15.9)
Immature Grans (Abs): 0 10*3/uL (ref 0.0–0.1)
Immature Granulocytes: 0 %
Lymphocytes Absolute: 1.4 10*3/uL (ref 0.7–3.1)
Lymphs: 22 %
MCH: 30 pg (ref 26.6–33.0)
MCHC: 35 g/dL (ref 31.5–35.7)
MCV: 86 fL (ref 79–97)
Monocytes Absolute: 0.5 10*3/uL (ref 0.1–0.9)
Monocytes: 8 %
Neutrophils Absolute: 4.1 10*3/uL (ref 1.4–7.0)
Neutrophils: 67 %
Platelets: 248 10*3/uL (ref 150–450)
RBC: 5.04 x10E6/uL (ref 3.77–5.28)
RDW: 12 % (ref 11.7–15.4)
WBC: 6.1 10*3/uL (ref 3.4–10.8)

## 2021-11-20 LAB — LIPID PANEL
Chol/HDL Ratio: 5.4 ratio — ABNORMAL HIGH (ref 0.0–4.4)
Cholesterol, Total: 193 mg/dL (ref 100–199)
HDL: 36 mg/dL — ABNORMAL LOW (ref >39)
LDL Chol Calc (NIH): 126 mg/dL — ABNORMAL HIGH (ref 0–99)
Triglycerides: 174 mg/dL — ABNORMAL HIGH (ref 0–149)
VLDL Cholesterol Cal: 31 mg/dL (ref 5–40)

## 2021-11-20 LAB — LYME DISEASE SEROLOGY W/REFLEX: Lyme Total Antibody EIA: NEGATIVE

## 2021-11-20 LAB — ALPHA-GAL PANEL
Allergen Lamb IgE: 0.2 kU/L — AB
Beef IgE: 0.3 kU/L — AB
IgE (Immunoglobulin E), Serum: 108 IU/mL (ref 6–495)
O215-IgE Alpha-Gal: 0.65 kU/L — AB
Pork IgE: 0.11 kU/L — AB

## 2021-11-20 LAB — ROCKY MTN SPOTTED FVR ABS PNL(IGG+IGM)
RMSF IgG: NEGATIVE
RMSF IgM: 0.37 index (ref 0.00–0.89)

## 2021-11-20 LAB — TSH: TSH: 2.38 u[IU]/mL (ref 0.450–4.500)

## 2021-11-24 ENCOUNTER — Telehealth: Payer: Self-pay | Admitting: Family Medicine

## 2021-11-27 NOTE — Telephone Encounter (Signed)
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 Pt states that she will try to limit it because she has an occasional upset stomach.

## 2022-05-11 ENCOUNTER — Other Ambulatory Visit: Payer: Self-pay | Admitting: Family Medicine

## 2022-05-11 DIAGNOSIS — I10 Essential (primary) hypertension: Secondary | ICD-10-CM

## 2022-06-06 ENCOUNTER — Ambulatory Visit (INDEPENDENT_AMBULATORY_CARE_PROVIDER_SITE_OTHER): Payer: Medicare HMO | Admitting: Family Medicine

## 2022-06-06 ENCOUNTER — Encounter: Payer: Self-pay | Admitting: Family Medicine

## 2022-06-06 VITALS — BP 124/85 | HR 76 | Ht 70.0 in | Wt 213.0 lb

## 2022-06-06 DIAGNOSIS — I10 Essential (primary) hypertension: Secondary | ICD-10-CM | POA: Diagnosis not present

## 2022-06-06 DIAGNOSIS — I480 Paroxysmal atrial fibrillation: Secondary | ICD-10-CM

## 2022-06-06 DIAGNOSIS — T781XXA Other adverse food reactions, not elsewhere classified, initial encounter: Secondary | ICD-10-CM | POA: Diagnosis not present

## 2022-06-06 DIAGNOSIS — N182 Chronic kidney disease, stage 2 (mild): Secondary | ICD-10-CM

## 2022-06-06 DIAGNOSIS — E7800 Pure hypercholesterolemia, unspecified: Secondary | ICD-10-CM | POA: Diagnosis not present

## 2022-06-06 MED ORDER — HYDROCHLOROTHIAZIDE 25 MG PO TABS: 25.0000 mg | ORAL_TABLET | Freq: Every day | ORAL | 3 refills | Status: DC

## 2022-06-06 NOTE — Progress Notes (Signed)
BP 124/85   Pulse 76   Ht  (1.778 m)   Wt 213 lb (96.6 kg)   SpO2 96%   BMI 30.56 kg/m    Subjective:   Patient ID: Alison Garrett, female    DOB: 05-05-1945, 77 y.o.   MRN: [id number]  HPI: Alison Garrett is a 77 y.o. female presenting on 06/06/2022 for Medical Management of Chronic Issues   HPI Hypertension and A-fib recheck Patient is currently on hydrochlorothiazide, and their blood pressure today is 124/85. Patient denies any lightheadedness or dizziness. Patient denies headaches, blurred vision, chest pains, shortness of breath, or weakness. Denies any side effects from medication and is content with current medication.  Patient had a previous ablation and had been doing well without A-fib but she says every now and then exertional heart rate will get really fast and she is wondering if it is coming back.  She is going to call her cardiologist to get an appointment to get evaluated as soon as possible.  Hyperlipidemia Patient is coming in for recheck of his hyperlipidemia. The patient is currently taking no medicine currently, history of muscle aches and myalgias and allergies to statins. They deny any issues with myalgias or history of liver damage from it. They deny any focal numbness or weakness or chest pain.   CKD recheck Patient is coming in today for CKD recheck.  She denies any urinary symptoms or new complaints.  Relevant past medical, surgical, family and social history reviewed and updated as indicated. Interim medical history since our last visit reviewed. Allergies and medications reviewed and updated.  Review of Systems  Constitutional:  Negative for chills and fever.  Eyes:  Negative for visual disturbance.  Respiratory:  Negative for chest tightness and shortness of breath.   Cardiovascular:  Negative for chest pain and leg swelling.  Genitourinary:  Negative for difficulty urinating and dysuria.  Musculoskeletal:  Negative for back pain and gait problem.   Skin:  Negative for rash.  Neurological:  Negative for dizziness, light-headedness and headaches.  Psychiatric/Behavioral:  Negative for agitation and behavioral problems.   All other systems reviewed and are negative.   Per HPI unless specifically indicated above   Allergies as of 06/06/2022       Reactions   Ciprofloxacin Shortness Of Breath, Rash   Contrast Media [iodinated Contrast Media] Anaphylaxis   Diltiazem Other (See Comments)   Pt. Had pain, jitters and felt terrible   Metoprolol Nausea And Vomiting   Penicillins Anaphylaxis   Has patient had a PCN reaction causing immediate rash, facial/tongue/throat swelling, SOB or lightheadedness with hypotension: Noyes Has patient had a PCN reaction causing severe rash involving mucus membranes or skin necrosis: Noyes Has patient had a PCN reaction that required hospitalization Nounknown Has patient had a PCN reaction occurring within the last 10 years: Nono If all of the above answers are "NO", then may proceed with Cephalo   Nitrofuran Derivatives Nausea And Vomiting   Sulfonamide Derivatives Hives   Benicar [olmesartan] Rash   Celebrex [celecoxib] Anxiety   Cymbalta [duloxetine Hcl] Itching   Latex Rash   Neurontin [gabapentin] Nausea Only   Norvasc [amlodipine Besylate] Swelling   Prozac [fluoxetine Hcl] Other (See Comments)   Xanax [alprazolam] Other (See Comments)   Zoloft [sertraline Hcl] Itching   Zyprexa [olanzapine] Other (See Comments)   Wt gain        Medication List        Accurate as of June 06, 2022  8:52 AM. If you have any questions, ask your nurse or doctor.          aspirin EC 81 MG tablet Take 81 mg by mouth daily. Swallow whole.   hydrochlorothiazide 25 MG tablet Commonly known as: HYDRODIURIL Take 1 tablet (25 mg total) by mouth daily. What changed: additional instructions Changed by: Elige Radon Marletta Bousquet, MD         Objective:   BP 124/85   Pulse 76   Ht  (1.778 m)   Wt  213 lb (96.6 kg)   SpO2 96%   BMI 30.56 kg/m   Wt Readings from Last 3 Encounters:  06/06/22 213 lb (96.6 kg)  11/15/21 216 lb (98 kg)  06/08/21 213 lb (96.6 kg)    Physical Exam Vitals and nursing note reviewed.  Constitutional:      General: She is not in acute distress.    Appearance: She is well-developed. She is not diaphoretic.  Eyes:     Conjunctiva/sclera: Conjunctivae normal.  Cardiovascular:     Rate and Rhythm: Normal rate and regular rhythm.     Heart sounds: Normal heart sounds. No murmur heard. Pulmonary:     Effort: Pulmonary effort is normal. No respiratory distress.     Breath sounds: Normal breath sounds. No wheezing.  Musculoskeletal:        General: No swelling or tenderness. Normal range of motion.  Skin:    General: Skin is warm and dry.     Findings: No rash.  Neurological:     Mental Status: She is alert and oriented to person, place, and time.     Coordination: Coordination normal.  Psychiatric:        Behavior: Behavior normal.       Assessment & Plan:   Problem List Items Addressed This Visit       Cardiovascular and Mediastinum   HYPERTENSION, BENIGN   Relevant Medications   hydrochlorothiazide (HYDRODIURIL) 25 MG tablet   Paroxysmal A-fib   Relevant Medications   hydrochlorothiazide (HYDRODIURIL) 25 MG tablet     Genitourinary   CKD (chronic kidney disease) stage 2, GFR 60-89 ml/min   Relevant Orders   CBC with Differential/Platelet   CMP14+EGFR     Other   Hyperlipemia - Primary   Relevant Medications   hydrochlorothiazide (HYDRODIURIL) 25 MG tablet   Other Relevant Orders   Lipid panel   Other Visit Diagnoses     Allergic reaction to alpha-gal       Relevant Orders   Alpha-Gal Panel     Continue current medicine, no changes.  Will do blood work today.  She does want to be retested for alpha gal  Follow up plan: Return in about 6 months (around 12/06/2022), or if symptoms worsen or fail to improve, for Hypertension  and CKD and hyperlipidemia recheck.  Counseling provided for all of the vaccine components Orders Placed This Encounter  Procedures   CBC with Differential/Platelet   CMP14+EGFR   Lipid panel   Alpha-Gal Panel    Arville Care, MD [facility] 06/06/2022, 8:53 AM

## 2022-06-11 LAB — CMP14+EGFR
ALT: 42 IU/L — ABNORMAL HIGH (ref 0–32)
AST: 38 IU/L (ref 0–40)
Albumin/Globulin Ratio: 1.3 (ref 1.2–2.2)
Albumin: 3.9 g/dL (ref 3.8–4.8)
Alkaline Phosphatase: 122 IU/L — ABNORMAL HIGH (ref 44–121)
BUN/Creatinine Ratio: 12 (ref 12–28)
BUN: 14 mg/dL (ref 8–27)
Bilirubin Total: 0.8 mg/dL (ref 0.0–1.2)
CO2: 24 mmol/L (ref 20–29)
Calcium: 9.7 mg/dL (ref 8.7–10.3)
Chloride: 98 mmol/L (ref 96–106)
Creatinine, Ser: 1.19 mg/dL — ABNORMAL HIGH (ref 0.57–1.00)
Globulin, Total: 3 g/dL (ref 1.5–4.5)
Glucose: 141 mg/dL — ABNORMAL HIGH (ref 70–99)
Potassium: 4.2 mmol/L (ref 3.5–5.2)
Sodium: 141 mmol/L (ref 134–144)
Total Protein: 6.9 g/dL (ref 6.0–8.5)
eGFR: 48 mL/min/{1.73_m2} — ABNORMAL LOW (ref >59)

## 2022-06-11 LAB — ALPHA-GAL PANEL
Allergen Lamb IgE: 0.15 kU/L — AB
Beef IgE: 0.23 kU/L — AB
IgE (Immunoglobulin E), Serum: 92 IU/mL (ref 6–495)
O215-IgE Alpha-Gal: 0.18 kU/L — AB
Pork IgE: <0.1 kU/L

## 2022-06-11 LAB — CBC WITH DIFFERENTIAL/PLATELET
Basophils Absolute: 0.1 10*3/uL (ref 0.0–0.2)
Basos: 1 %
EOS (ABSOLUTE): 0.1 10*3/uL (ref 0.0–0.4)
Eos: 2 %
Hematocrit: 45.1 % (ref 34.0–46.6)
Hemoglobin: 15.2 g/dL (ref 11.1–15.9)
Immature Grans (Abs): 0 10*3/uL (ref 0.0–0.1)
Immature Granulocytes: 1 %
Lymphocytes Absolute: 1.3 10*3/uL (ref 0.7–3.1)
Lymphs: 21 %
MCH: 29.1 pg (ref 26.6–33.0)
MCHC: 33.7 g/dL (ref 31.5–35.7)
MCV: 86 fL (ref 79–97)
Monocytes Absolute: 0.5 10*3/uL (ref 0.1–0.9)
Monocytes: 8 %
Neutrophils Absolute: 4.3 10*3/uL (ref 1.4–7.0)
Neutrophils: 67 %
Platelets: 278 10*3/uL (ref 150–450)
RBC: 5.23 x10E6/uL (ref 3.77–5.28)
RDW: 12.7 % (ref 11.7–15.4)
WBC: 6.3 10*3/uL (ref 3.4–10.8)

## 2022-06-11 LAB — LIPID PANEL
Chol/HDL Ratio: 5.6 ratio — ABNORMAL HIGH (ref 0.0–4.4)
Cholesterol, Total: 191 mg/dL (ref 100–199)
HDL: 34 mg/dL — ABNORMAL LOW (ref >39)
LDL Chol Calc (NIH): 123 mg/dL — ABNORMAL HIGH (ref 0–99)
Triglycerides: 190 mg/dL — ABNORMAL HIGH (ref 0–149)
VLDL Cholesterol Cal: 34 mg/dL (ref 5–40)

## 2022-06-12 ENCOUNTER — Ambulatory Visit (INDEPENDENT_AMBULATORY_CARE_PROVIDER_SITE_OTHER): Payer: Medicare HMO

## 2022-06-12 VITALS — Ht 70.0 in | Wt 210.0 lb

## 2022-06-12 DIAGNOSIS — Z0000 Encounter for general adult medical examination without abnormal findings: Secondary | ICD-10-CM

## 2022-06-12 NOTE — Progress Notes (Signed)
Subjective:   Alison Garrett is a 77 y.o. female who presents for Medicare Annual (Subsequent) preventive examination. I connected with  Alison Garrett on 06/12/22 by a audio enabled telemedicine application and verified that I am speaking with the correct person using two identifiers.  Patient Location: Home  Provider Location: Home Office  I discussed the limitations of evaluation and management by telemedicine. The patient expressed understanding and agreed to proceed.  Review of Systems     Cardiac Risk Factors include: advanced age (>5men, >49 women);hypertension     Objective:    Today's Vitals   06/12/22 1115  Weight: 210 lb (95.3 kg)  Height: 5\' 10"  (1.778 m)   Body mass index is 30.13 kg/m.     06/12/2022   11:18 AM 06/08/2021   12:03 PM 04/01/2021    7:59 AM 05/26/2020    1:04 PM 03/23/2019    2:58 PM 02/22/2019   12:44 PM 12/19/2018    7:25 AM  Advanced Directives  Does Patient Have a Medical Advance Directive? No No No No No No No  Would patient like information on creating a medical advance directive? No - Patient declined No - Patient declined  Yes (MAU/Ambulatory/Procedural Areas - Information given)  No - Patient declined No - Patient declined    Current Medications (verified) Outpatient Encounter Medications as of 06/12/2022  Medication Sig   aspirin 81 MG EC tablet Take 81 mg by mouth daily. Swallow whole.   hydrochlorothiazide (HYDRODIURIL) 25 MG tablet Take 1 tablet (25 mg total) by mouth daily.   No facility-administered encounter medications on file as of 06/12/2022.    Allergies (verified) Ciprofloxacin, Contrast media [iodinated contrast media], Diltiazem, Metoprolol, Penicillins, Nitrofuran derivatives, Sulfonamide derivatives, Benicar [olmesartan], Celebrex [celecoxib], Cymbalta [duloxetine hcl], Latex, Neurontin [gabapentin], Norvasc [amlodipine besylate], Prozac [fluoxetine hcl], Xanax [alprazolam], Zoloft [sertraline hcl], and Zyprexa [olanzapine]    History: Past Medical History:  Diagnosis Date   Afib (HCC)    Complication of anesthesia    propathal    Dyslipidemia    Essential tremor 01/12/2014   Fibromyalgia    Not currently under active treatment   GERD (gastroesophageal reflux disease)    Hiatal hernia    Hypertension    Obesity    Past Surgical History:  Procedure Laterality Date   ABDOMINAL HYSTERECTOMY     SVT ABLATION N/A 12/19/2018   Procedure: SVT ABLATION;  Surgeon: Regan Lemming, MD;  Location: [facility];  Service: Cardiovascular;  Laterality: N/A;   Family History  Problem Relation Age of Onset   Coronary artery disease Mother    Heart disease Mother        CABG   Cancer Mother        trachea   COPD Father    Hepatitis Sister        autoimune   Diabetes Sister    Social History   Socioeconomic History   Marital status: Divorced    Spouse name: Not on file   Number of children: 3   Years of education: some college   Highest education level: Some college, no degree  Occupational History    Employer: Licensed conveyancer     Comment: full time  Tobacco Use   Smoking status: Never   Smokeless tobacco: Never  Vaping Use   Vaping Use: Never used  Substance and Sexual Activity   Alcohol use: No    Alcohol/week: 0.0 standard drinks of alcohol   Drug use:  No   Sexual activity: Not Currently  Other Topics Concern   Not on file  Social History Narrative   Lives in [place].   Patient is right handed   Drinks very little caffeine.   Works every day but Wednesday and Sunday   Lives alone.   Social Determinants of Health   Financial Resource Strain: Low Risk  (06/12/2022)   Overall Financial Resource Strain (CARDIA)    Difficulty of Paying Living Expenses: Not hard at all  Food Insecurity: No Food Insecurity (06/12/2022)   Hunger Vital Sign    Worried About Running Out of Food in the Last Year: Never true    Ran Out of Food in the Last Year: Never true  Transportation  Needs: No Transportation Needs (06/08/2021)   PRAPARE - Administrator, Civil Service (Medical): No    Lack of Transportation (Non-Medical): No  Physical Activity: Sufficiently Active (06/12/2022)   Exercise Vital Sign    Days of Exercise per Week: 5 days    Minutes of Exercise per Session: 30 min  Stress: No Stress Concern Present (06/12/2022)   [employer] of Occupational Health - Occupational Stress Questionnaire    Feeling of Stress : Not at all  Social Connections: Moderately Integrated (06/12/2022)   Social Connection and Isolation Panel [NHANES]    Frequency of Communication with Friends and Family: More than three times a week    Frequency of Social Gatherings with Friends and Family: More than three times a week    Attends Religious Services: Never    Database administrator or Organizations: Yes    Attends Engineer, structural: More than 4 times per year    Marital Status: Married    Tobacco Counseling Counseling given: Not Answered   Clinical Intake:  Pre-visit preparation completed: Yes  Pain : No/denies pain     Nutritional Risks: None Diabetes: No  How often do you need to have someone help you when you read instructions, pamphlets, or other written materials from your doctor or pharmacy?: 1 - Never  Diabetic?no   Interpreter Needed?: No  Information entered by :: Renie Ora, LPN   Activities of Daily Living    06/12/2022   11:18 AM 06/11/2022    6:59 PM  In your present state of health, do you have any difficulty performing the following activities:  Hearing? 0 0  Vision? 0 0  Difficulty concentrating or making decisions? 0 0  Walking or climbing stairs? 0 0  Dressing or bathing? 0 0  Doing errands, shopping? 0 0  Preparing Food and eating ? N N  Using the Toilet? N N  In the past six months, have you accidently leaked urine? N N  Do you have problems with loss of bowel control? N N  Managing your Medications? N N   Managing your Finances? N N  Housekeeping or managing your Housekeeping? N N    Patient Care Team: Dettinger, Elige Radon, MD as PCP - General (Family Medicine) Rollene Rotunda, MD as PCP - Cardiology (Cardiology) Delora Fuel, OD (Optometry)  Indicate any recent Medical Services you may have received from other than [facility] providers in the past year (date may be approximate).     Assessment:   This is a routine wellness examination for Florita.  Hearing/Vision screen Vision Screening - Comments:: Wears rx glasses - up to date with routine eye exams with  Dr.Johnson   Dietary issues and exercise activities discussed: Current Exercise Habits:  Home exercise routine, Type of exercise: walking, Time (Minutes): 60, Frequency (Times/Week): 5, Weekly Exercise (Minutes/Week): 300, Intensity: Mild, Exercise limited by: None identified   Goals Addressed             This Visit's Progress    DIET - INCREASE WATER INTAKE   On track    Try to drink 6-8 glasses of water daily.       Depression Screen    06/12/2022   11:17 AM 06/06/2022    8:12 AM 11/15/2021    8:51 AM 06/08/2021   12:01 PM 05/15/2021    9:07 AM 08/09/2020    1:59 PM 05/26/2020    1:04 PM  PHQ 2/9 Scores  PHQ - 2 Score 0 0 0 0 0 0 0  PHQ- 9 Score 0 0 2 4 7       Fall Risk    06/12/2022   11:16 AM 06/11/2022    6:59 PM 06/06/2022    8:11 AM 11/15/2021    8:50 AM 06/08/2021   11:59 AM  Fall Risk   Falls in the past year? 0 0 0 0 0  Number falls in past yr: 0    0  Injury with Fall? 0    0  Risk for fall due to : No Fall Risks    Orthopedic patient  Follow up Falls prevention discussed    Falls prevention discussed    FALL RISK PREVENTION PERTAINING TO THE HOME:  Any stairs in or around the home? No  If so, are there any without handrails? No  Home free of loose throw rugs in walkways, pet beds, electrical cords, etc? Yes  Adequate lighting in your home to reduce risk of falls? Yes   ASSISTIVE DEVICES UTILIZED TO  PREVENT FALLS:  Life alert? No  Use of a cane, walker or w/c? No  Grab bars in the bathroom? No  Shower chair or bench in shower? No  Elevated toilet seat or a handicapped toilet? No       11/18/2017    4:01 PM  MMSE - Mini Mental State Exam  Orientation to time 5  Orientation to Place 5  Registration 3  Attention/ Calculation 5  Recall 3  Language- name 2 objects 2  Language- repeat 1  Language- follow 3 step command 3  Language- read & follow direction 1  Write a sentence 1  Copy design 1  Total score 30        06/12/2022   11:19 AM 06/08/2021   12:06 PM 11/26/2018    9:41 AM  6CIT Screen  What Year? 0 points 0 points 0 points  What month? 0 points 0 points 0 points  What time? 0 points 0 points 0 points  Count back from 20 0 points 0 points 0 points  Months in reverse 0 points 0 points 0 points  Repeat phrase 0 points 0 points 2 points  Total Score 0 points 0 points 2 points    Immunizations Immunization History  Administered Date(s) Administered   Tdap 08/23/2020    TDAP status: Up to date  Flu Vaccine status: Declined, Education has been provided regarding the importance of this vaccine but patient still declined. Advised may receive this vaccine at local pharmacy or Health Dept. Aware to provide a copy of the vaccination record if obtained from local pharmacy or Health Dept. Verbalized acceptance and understanding.  Pneumococcal vaccine status: Declined,  Education has been provided regarding the importance of this vaccine  but patient still declined. Advised may receive this vaccine at local pharmacy or Health Dept. Aware to provide a copy of the vaccination record if obtained from local pharmacy or Health Dept. Verbalized acceptance and understanding.   Covid-19 vaccine status: Declined, Education has been provided regarding the importance of this vaccine but patient still declined. Advised may receive this vaccine at local pharmacy or Health Dept.or vaccine  clinic. Aware to provide a copy of the vaccination record if obtained from local pharmacy or Health Dept. Verbalized acceptance and understanding.  Qualifies for Shingles Vaccine? Yes   Zostavax completed No   Shingrix Completed?: No.    Education has been provided regarding the importance of this vaccine. Patient has been advised to call insurance company to determine out of pocket expense if they have not yet received this vaccine. Advised may also receive vaccine at local pharmacy or Health Dept. Verbalized acceptance and understanding.  Screening Tests Health Maintenance  Topic Date Due   COVID-19 Vaccine (1) 06/22/2022 (Originally 05/19/1946)   Zoster Vaccines- Shingrix (1 of 2) 11/19/2022 (Originally 11/17/1996)   Pneumonia Vaccine 61+ Years old (1 of 1 - PCV) 06/06/2023 (Originally 11/18/2011)   DEXA SCAN  06/06/2023 (Originally 05/23/2021)   COLONOSCOPY (Pts 45-40yrs Insurance coverage will need to be confirmed)  06/06/2023 (Originally 03/25/2013)   MAMMOGRAM  12/06/2023 (Originally 03/30/2021)   COLON CANCER SCREENING ANNUAL FOBT  07/25/2022   INFLUENZA VACCINE  09/13/2022   Medicare Annual Wellness (AWV)  06/12/2023   DTaP/Tdap/Td (2 - Td or Tdap) 08/24/2030   Hepatitis C Screening  Completed   HPV VACCINES  Aged Out    Health Maintenance  There are no preventive care reminders to display for this patient.   Colorectal cancer screening: Referral to GI placed patient will schedule . Pt aware the office will call re: appt.  Mammogram status: Ordered patient will schedule . Pt provided with contact info and advised to call to schedule appt.   Bone Density status: Completed 05/23/2016. Results reflect: Bone density results: OSTEOPENIA. Repeat every 5 years.  Lung Cancer Screening: (Low Dose CT Chest recommended if Age 45-80 years, 30 pack-year currently smoking OR have quit w/in 15years.) does not qualify.   Lung Cancer Screening Referral: n/a  Additional Screening:  Hepatitis  C Screening: does not qualify;   Vision Screening: Recommended annual ophthalmology exams for early detection of glaucoma and other disorders of the eye. Is the patient up to date with their annual eye exam?  Yes  Who is the provider or what is the name of the office in which the patient attends annual eye exams? Dr.Johnson  If pt is not established with a provider, would they like to be referred to a provider to establish care? No .   Dental Screening: Recommended annual dental exams for proper oral hygiene  Community Resource Referral / Chronic Care Management: CRR required this visit?  No   CCM required this visit?  No      Plan:     I have personally reviewed and noted the following in the patient's chart:   Medical and social history Use of alcohol, tobacco or illicit drugs  Current medications and supplements including opioid prescriptions. Patient is not currently taking opioid prescriptions. Functional ability and status Nutritional status Physical activity Advanced directives List of other physicians Hospitalizations, surgeries, and ER visits in previous 12 months Vitals Screenings to include cognitive, depression, and falls Referrals and appointments  In addition, I have reviewed and discussed with patient  certain preventive protocols, quality metrics, and best practice recommendations. A written personalized care plan for preventive services as well as general preventive health recommendations were provided to patient.     Lorrene Reid, LPN   05/21/8117   Nurse Notes: Declines Vaccines

## 2022-06-12 NOTE — Patient Instructions (Signed)
[name] , Thank you for taking time to come for your Medicare Wellness Visit. I appreciate your ongoing commitment to your health goals. Please review the following plan we discussed and let me know if I can assist you in the future.   These are the goals we discussed:  Goals      Concern for social isolation, especially due to COVID19 Social Distancing Guidelines     Current Barriers:  None identified  Nurse Case Manager Clinical Goal(s):  Over the next 30 days, patient will continue to work [facility] [employer]) and maintain current level of socialization within recommended guidelines Over the next 60 days, patient will talk with Medical illustrator, LCSW, PCP, or another professional regarding any negative effects that social distancing is having on her mental and physical health.  Over the next 60 days, patient will continue to engage in healthy alternatives to in-person socializing  Interventions:  Encouraged patient to continue reading as a pastime Advised to seek help if needed Encouraged to continue to work as safely as possible  Patient Self Care Activities:  Performs ADL's independently Performs IADL's independently  Initial goal documentation       DIET - INCREASE WATER INTAKE     Try to drink 6-8 glasses of water daily.     Exercise 150 min/wk Moderate Activity     Goals Addressed             This Visit's Progress    DIET - INCREASE WATER INTAKE   On track    Try to drink 6-8 glasses of water daily.     Exercise 150 min/wk Moderate Activity   On track              This is a list of the screening recommended for you and due dates:  Health Maintenance  Topic Date Due   COVID-19 Vaccine (1) 06/22/2022*   Zoster (Shingles) Vaccine (1 of 2) 11/19/2022*   Pneumonia Vaccine (1 of 1 - PCV) 06/06/2023*   DEXA scan (bone density measurement)  06/06/2023*   Colon Cancer Screening  06/06/2023*   Mammogram  12/06/2023*   Stool Blood Test   07/25/2022   Flu Shot  09/13/2022   Medicare Annual Wellness Visit  06/12/2023   DTaP/Tdap/Td vaccine (2 - Td or Tdap) 08/24/2030   Hepatitis C Screening: USPSTF Recommendation to screen - Ages 74-79 yo.  Completed   HPV Vaccine  Aged Out  *Topic was postponed. The date shown is not the original due date.    Advanced directives: Advance directive discussed with you today. I have provided a copy for you to complete at home and have notarized. Once this is complete please bring a copy in to our office so we can scan it into your chart.   Conditions/risks identified: Aim for 30 minutes of exercise or brisk walking, 6-8 glasses of water, and 5 servings of fruits and vegetables each day.   Next appointment: Follow up in one year for your annual wellness visit    Preventive Care 65 Years and Older, Female Preventive care refers to lifestyle choices and visits with your health care provider that can promote health and wellness. What does preventive care include? A yearly physical exam. This is also called an annual well check. Dental exams once or twice a year. Routine eye exams. Ask your health care provider how often you should have your eyes checked. Personal lifestyle choices, including: Daily care of your teeth and gums. Regular  physical activity. Eating a healthy diet. Avoiding tobacco and drug use. Limiting alcohol use. Practicing safe sex. Taking low-dose aspirin every day. Taking vitamin and mineral supplements as recommended by your health care provider. What happens during an annual well check? The services and screenings done by your health care provider during your annual well check will depend on your age, overall health, lifestyle risk factors, and family history of disease. Counseling  Your health care provider may ask you questions about your: Alcohol use. Tobacco use. Drug use. Emotional well-being. Home and relationship well-being. Sexual activity. Eating  habits. History of falls. Memory and ability to understand (cognition). Work and work Astronomer. Reproductive health. Screening  You may have the following tests or measurements: Height, weight, and BMI. Blood pressure. Lipid and cholesterol levels. These may be checked every 5 years, or more frequently if you are over 34 years old. Skin check. Lung cancer screening. You may have this screening every year starting at age 63 if you have a 30-pack-year history of smoking and currently smoke or have quit within the past 15 years. Fecal occult blood test (FOBT) of the stool. You may have this test every year starting at age 54. Flexible sigmoidoscopy or colonoscopy. You may have a sigmoidoscopy every 5 years or a colonoscopy every 10 years starting at age 61. Hepatitis C blood test. Hepatitis B blood test. Sexually transmitted disease (STD) testing. Diabetes screening. This is done by checking your blood sugar (glucose) after you have not eaten for a while (fasting). You may have this done every 1-3 years. Bone density scan. This is done to screen for osteoporosis. You may have this done starting at age 61. Mammogram. This may be done every 1-2 years. Talk to your health care provider about how often you should have regular mammograms. Talk with your health care provider about your test results, treatment options, and if necessary, the need for more tests. Vaccines  Your health care provider may recommend certain vaccines, such as: Influenza vaccine. This is recommended every year. Tetanus, diphtheria, and acellular pertussis (Tdap, Td) vaccine. You may need a Td booster every 10 years. Zoster vaccine. You may need this after age 96. Pneumococcal 13-valent conjugate (PCV13) vaccine. One dose is recommended after age 10. Pneumococcal polysaccharide (PPSV23) vaccine. One dose is recommended after age 27. Talk to your health care provider about which screenings and vaccines you need and how  often you need them. This information is not intended to replace advice given to you by your health care provider. Make sure you discuss any questions you have with your health care provider. Document Released: 02/25/2015 Document Revised: 10/19/2015 Document Reviewed: 11/30/2014 Elsevier Interactive Patient Education  2017 [employer].  Fall Prevention in the Home Falls can cause injuries. They can happen to people of all ages. There are many things you can do to make your home safe and to help prevent falls. What can I do on the outside of my home? Regularly fix the edges of walkways and driveways and fix any cracks. Remove anything that might make you trip as you walk through a door, such as a raised step or threshold. Trim any bushes or trees on the path to your home. Use bright outdoor lighting. Clear any walking paths of anything that might make someone trip, such as rocks or tools. Regularly check to see if handrails are loose or broken. Make sure that both sides of any steps have handrails. Any raised decks and porches should have guardrails  on the edges. Have any leaves, snow, or ice cleared regularly. Use sand or salt on walking paths during winter. Clean up any spills in your garage right away. This includes oil or grease spills. What can I do in the bathroom? Use night lights. Install grab bars by the toilet and in the tub and shower. Do not use towel bars as grab bars. Use non-skid mats or decals in the tub or shower. If you need to sit down in the shower, use a plastic, non-slip stool. Keep the floor dry. Clean up any water that spills on the floor as soon as it happens. Remove soap buildup in the tub or shower regularly. Attach bath mats securely with double-sided non-slip rug tape. Do not have throw rugs and other things on the floor that can make you trip. What can I do in the bedroom? Use night lights. Make sure that you have a light by your bed that is easy to  reach. Do not use any sheets or blankets that are too big for your bed. They should not hang down onto the floor. Have a firm chair that has side arms. You can use this for support while you get dressed. Do not have throw rugs and other things on the floor that can make you trip. What can I do in the kitchen? Clean up any spills right away. Avoid walking on wet floors. Keep items that you use a lot in easy-to-reach places. If you need to reach something above you, use a strong step stool that has a grab bar. Keep electrical cords out of the way. Do not use floor polish or wax that makes floors slippery. If you must use wax, use non-skid floor wax. Do not have throw rugs and other things on the floor that can make you trip. What can I do with my stairs? Do not leave any items on the stairs. Make sure that there are handrails on both sides of the stairs and use them. Fix handrails that are broken or loose. Make sure that handrails are as long as the stairways. Check any carpeting to make sure that it is firmly attached to the stairs. Fix any carpet that is loose or worn. Avoid having throw rugs at the top or bottom of the stairs. If you do have throw rugs, attach them to the floor with carpet tape. Make sure that you have a light switch at the top of the stairs and the bottom of the stairs. If you do not have them, ask someone to add them for you. What else can I do to help prevent falls? Wear shoes that: Do not have high heels. Have rubber bottoms. Are comfortable and fit you well. Are closed at the toe. Do not wear sandals. If you use a stepladder: Make sure that it is fully opened. Do not climb a closed stepladder. Make sure that both sides of the stepladder are locked into place. Ask someone to hold it for you, if possible. Clearly mark and make sure that you can see: Any grab bars or handrails. First and last steps. Where the edge of each step is. Use tools that help you move  around (mobility aids) if they are needed. These include: Canes. Walkers. Scooters. Crutches. Turn on the lights when you go into a dark area. Replace any light bulbs as soon as they burn out. Set up your furniture so you have a clear path. Avoid moving your furniture around. If any of your floors  are uneven, fix them. If there are any pets around you, be aware of where they are. Review your medicines with your doctor. Some medicines can make you feel dizzy. This can increase your chance of falling. Ask your doctor what other things that you can do to help prevent falls. This information is not intended to replace advice given to you by your health care provider. Make sure you discuss any questions you have with your health care provider. Document Released: 11/25/2008 Document Revised: 07/07/2015 Document Reviewed: 03/05/2014 Elsevier Interactive Patient Education  2017 [employer].

## 2022-06-20 DIAGNOSIS — L570 Actinic keratosis: Secondary | ICD-10-CM | POA: Diagnosis not present

## 2022-06-20 DIAGNOSIS — L508 Other urticaria: Secondary | ICD-10-CM | POA: Diagnosis not present

## 2022-06-20 DIAGNOSIS — S30861A Insect bite (nonvenomous) of abdominal wall, initial encounter: Secondary | ICD-10-CM | POA: Diagnosis not present

## 2022-06-20 DIAGNOSIS — L720 Epidermal cyst: Secondary | ICD-10-CM | POA: Diagnosis not present

## 2022-08-09 DIAGNOSIS — L720 Epidermal cyst: Secondary | ICD-10-CM | POA: Diagnosis not present

## 2022-10-03 ENCOUNTER — Telehealth: Payer: Self-pay | Admitting: Family Medicine

## 2022-10-03 DIAGNOSIS — Z0279 Encounter for issue of other medical certificate: Secondary | ICD-10-CM

## 2022-10-03 NOTE — Telephone Encounter (Signed)
Pt dropped off handicap placard forms to be completed and signed.  Form Fee Paid? (Y/N)       YES     If NO, form is placed on front office manager desk to hold until payment received. If YES, then form will be placed in the RX/HH Nurse Coordinators box for completion.  Form will not be processed until payment is received

## 2022-10-08 NOTE — Telephone Encounter (Signed)
Aware handicap form ready

## 2022-10-24 DIAGNOSIS — H43813 Vitreous degeneration, bilateral: Secondary | ICD-10-CM | POA: Diagnosis not present

## 2022-10-24 DIAGNOSIS — H52223 Regular astigmatism, bilateral: Secondary | ICD-10-CM | POA: Diagnosis not present

## 2022-10-24 DIAGNOSIS — H524 Presbyopia: Secondary | ICD-10-CM | POA: Diagnosis not present

## 2022-10-24 DIAGNOSIS — H2513 Age-related nuclear cataract, bilateral: Secondary | ICD-10-CM | POA: Diagnosis not present

## 2022-11-28 ENCOUNTER — Encounter: Payer: Self-pay | Admitting: Family Medicine

## 2022-11-28 ENCOUNTER — Ambulatory Visit: Payer: Medicare HMO | Admitting: Family Medicine

## 2022-11-28 ENCOUNTER — Other Ambulatory Visit: Payer: Medicare HMO

## 2022-11-28 VITALS — BP 138/88 | HR 64 | Ht 70.0 in | Wt 208.0 lb

## 2022-11-28 DIAGNOSIS — E7800 Pure hypercholesterolemia, unspecified: Secondary | ICD-10-CM

## 2022-11-28 DIAGNOSIS — I10 Essential (primary) hypertension: Secondary | ICD-10-CM | POA: Diagnosis not present

## 2022-11-28 DIAGNOSIS — N182 Chronic kidney disease, stage 2 (mild): Secondary | ICD-10-CM

## 2022-11-28 NOTE — Progress Notes (Signed)
BP 138/88   Pulse 64   Ht 5\' 10"  (1.778 m)   Wt 208 lb (94.3 kg)   SpO2 95%   BMI 29.84 kg/m    Subjective:   Patient ID: Alison Garrett, female    DOB: 08/02/1945, 77 y.o.   MRN: [id number]  HPI: Alison Garrett is a 77 y.o. female presenting on 11/28/2022 for Medical Management of Chronic Issues and Hypertension   HPI Hypertension Patient is currently on hydrochlorothiazide, and their blood pressure today is 138/88. Patient denies any lightheadedness or dizziness. Patient denies headaches, blurred vision, chest pains, shortness of breath, or weakness. Denies any side effects from medication and is content with current medication.   Hyperlipidemia Patient is coming in for recheck of his hyperlipidemia. The patient is currently taking no medication currently, diet control. They deny any issues with myalgias or history of liver damage from it. They deny any focal numbness or weakness or chest pain.  She does take a preventative aspirin as well.  CKD recheck Patient is coming in for CKD recheck.  She denies any urinary issues.  Relevant past medical, surgical, family and social history reviewed and updated as indicated. Interim medical history since our last visit reviewed. Allergies and medications reviewed and updated.  Review of Systems  Constitutional:  Negative for chills and fever.  Eyes:  Negative for visual disturbance.  Respiratory:  Negative for chest tightness and shortness of breath.   Cardiovascular:  Negative for chest pain and leg swelling.  Genitourinary:  Negative for difficulty urinating and dysuria.  Musculoskeletal:  Negative for back pain and gait problem.  Skin:  Negative for rash.  Neurological:  Negative for dizziness, light-headedness and headaches.  Psychiatric/Behavioral:  Negative for agitation, behavioral problems and dysphoric mood. The patient is not nervous/anxious.   All other systems reviewed and are negative.   Per HPI unless specifically  indicated above   Allergies as of 11/28/2022       Reactions   Ciprofloxacin Shortness Of Breath, Rash   Contrast Media [iodinated Contrast Media] Anaphylaxis   Diltiazem Other (See Comments)   Pt. Had pain, jitters and felt terrible   Metoprolol Nausea And Vomiting   Penicillins Anaphylaxis   Has patient had a PCN reaction causing immediate rash, facial/tongue/throat swelling, SOB or lightheadedness with hypotension: Noyes Has patient had a PCN reaction causing severe rash involving mucus membranes or skin necrosis: Noyes Has patient had a PCN reaction that required hospitalization Nounknown Has patient had a PCN reaction occurring within the last 10 years: Nono If all of the above answers are "NO", then may proceed with Cephalo   Nitrofuran Derivatives Nausea And Vomiting   Sulfonamide Derivatives Hives   Benicar [olmesartan] Rash   Celebrex [celecoxib] Anxiety   Cymbalta [duloxetine Hcl] Itching   Latex Rash   Neurontin [gabapentin] Nausea Only   Norvasc [amlodipine Besylate] Swelling   Prozac [fluoxetine Hcl] Other (See Comments)   Xanax [alprazolam] Other (See Comments)   Zoloft [sertraline Hcl] Itching   Zyprexa [olanzapine] Other (See Comments)   Wt gain        Medication List        Accurate as of November 28, 2022  9:01 AM. If you have any questions, ask your nurse or doctor.          aspirin EC 81 MG tablet Take 81 mg by mouth daily. Swallow whole.   hydrochlorothiazide 25 MG tablet Commonly known as: HYDRODIURIL Take 1 tablet (25 mg total)  by mouth daily.         Objective:   BP 138/88   Pulse 64   Ht 5\' 10"  (1.778 m)   Wt 208 lb (94.3 kg)   SpO2 95%   BMI 29.84 kg/m   Wt Readings from Last 3 Encounters:  11/28/22 208 lb (94.3 kg)  06/12/22 210 lb (95.3 kg)  06/06/22 213 lb (96.6 kg)    Physical Exam Vitals and nursing note reviewed.  Constitutional:      General: She is not in acute distress.    Appearance: She is well-developed.  She is not diaphoretic.  Eyes:     Conjunctiva/sclera: Conjunctivae normal.  Cardiovascular:     Rate and Rhythm: Normal rate and regular rhythm.     Heart sounds: Normal heart sounds. No murmur heard. Pulmonary:     Effort: Pulmonary effort is normal. No respiratory distress.     Breath sounds: Normal breath sounds. No wheezing.  Musculoskeletal:        General: No swelling or tenderness. Normal range of motion.  Skin:    General: Skin is warm and dry.     Findings: No rash.  Neurological:     Mental Status: She is alert and oriented to person, place, and time.     Coordination: Coordination normal.  Psychiatric:        Behavior: Behavior normal.       Assessment & Plan:   Problem List Items Addressed This Visit       Cardiovascular and Mediastinum   HYPERTENSION, BENIGN     Genitourinary   CKD (chronic kidney disease) stage 2, GFR 60-89 ml/min - Primary     Other   Hyperlipemia    Patient declines doing mammograms or bone density scans at this point.  She says she feels like she is at her age where she would not care if something happened  Will do blood work today.  Blood pressure looks good.  No change Follow up plan: Return in about 6 months (around 05/29/2023), or if symptoms worsen or fail to improve, for Physical exam and hypertension recheck.  Counseling provided for all of the vaccine components No orders of the defined types were placed in this encounter.   Arville Care, MD [facility] 11/28/2022, 9:01 AM

## 2022-11-29 LAB — CMP14+EGFR
ALT: 46 [IU]/L — ABNORMAL HIGH (ref 0–32)
AST: 41 [IU]/L — ABNORMAL HIGH (ref 0–40)
Albumin: 3.9 g/dL (ref 3.8–4.8)
Alkaline Phosphatase: 98 [IU]/L (ref 44–121)
BUN/Creatinine Ratio: 11 — ABNORMAL LOW (ref 12–28)
BUN: 13 mg/dL (ref 8–27)
Bilirubin Total: 0.9 mg/dL (ref 0.0–1.2)
CO2: 28 mmol/L (ref 20–29)
Calcium: 9.2 mg/dL (ref 8.7–10.3)
Chloride: 100 mmol/L (ref 96–106)
Creatinine, Ser: 1.14 mg/dL — ABNORMAL HIGH (ref 0.57–1.00)
Globulin, Total: 2.8 g/dL (ref 1.5–4.5)
Glucose: 136 mg/dL — ABNORMAL HIGH (ref 70–99)
Potassium: 3.4 mmol/L — ABNORMAL LOW (ref 3.5–5.2)
Sodium: 143 mmol/L (ref 134–144)
Total Protein: 6.7 g/dL (ref 6.0–8.5)
eGFR: 50 mL/min/{1.73_m2} — ABNORMAL LOW (ref >59)

## 2022-11-29 LAB — CBC WITH DIFFERENTIAL/PLATELET
Basophils Absolute: 0.1 10*3/uL (ref 0.0–0.2)
Basos: 1 %
EOS (ABSOLUTE): 0.1 10*3/uL (ref 0.0–0.4)
Eos: 1 %
Hematocrit: 45 % (ref 34.0–46.6)
Hemoglobin: 15.4 g/dL (ref 11.1–15.9)
Immature Grans (Abs): 0 10*3/uL (ref 0.0–0.1)
Immature Granulocytes: 0 %
Lymphocytes Absolute: 1.5 10*3/uL (ref 0.7–3.1)
Lymphs: 21 %
MCH: 30.1 pg (ref 26.6–33.0)
MCHC: 34.2 g/dL (ref 31.5–35.7)
MCV: 88 fL (ref 79–97)
Monocytes Absolute: 0.5 10*3/uL (ref 0.1–0.9)
Monocytes: 7 %
Neutrophils Absolute: 5 10*3/uL (ref 1.4–7.0)
Neutrophils: 70 %
Platelets: 248 10*3/uL (ref 150–450)
RBC: 5.12 x10E6/uL (ref 3.77–5.28)
RDW: 13.1 % (ref 11.7–15.4)
WBC: 7.2 10*3/uL (ref 3.4–10.8)

## 2022-11-29 LAB — LIPID PANEL
Cholesterol, Total: 190 mg/dL (ref 100–199)
HDL: 36 mg/dL — ABNORMAL LOW (ref >39)
LDL CALC COMMENT:: 5.3 ratio — ABNORMAL HIGH (ref 0.0–4.4)
LDL Chol Calc (NIH): 121 mg/dL — ABNORMAL HIGH (ref 0–99)
Triglycerides: 184 mg/dL — ABNORMAL HIGH (ref 0–149)
VLDL Cholesterol Cal: 33 mg/dL (ref 5–40)

## 2022-12-05 ENCOUNTER — Other Ambulatory Visit: Payer: Self-pay | Admitting: *Deleted

## 2022-12-05 DIAGNOSIS — R748 Abnormal levels of other serum enzymes: Secondary | ICD-10-CM

## 2022-12-12 ENCOUNTER — Encounter: Payer: Self-pay | Admitting: Nurse Practitioner

## 2022-12-12 ENCOUNTER — Ambulatory Visit: Payer: Medicare HMO | Admitting: Nurse Practitioner

## 2022-12-12 VITALS — BP 116/70 | HR 88 | Ht 70.0 in | Wt 206.2 lb

## 2022-12-12 DIAGNOSIS — R7989 Other specified abnormal findings of blood chemistry: Secondary | ICD-10-CM | POA: Diagnosis not present

## 2022-12-12 NOTE — Patient Instructions (Signed)
You have been scheduled for an abdominal ultrasound at [facility] (1st floor of hospital) on 12/14/22 at 10:30 am. Please arrive 15-30 minutes prior to your appointment for registration. Make certain not to have anything to eat or drink 8 hours prior to your appointment. Should you need to reschedule your appointment, please contact radiology at [phone]. This test typically takes about 30 minutes to perform.  Please provide our office with a copy of your Cologuard test.  Reduce carbohydrates in your diet (Bread, pasta, rice, sweets, potatoes, etc.)  Further recommendations to be determined after ultrasound results received.  Due to recent changes in healthcare laws, you may see the results of your imaging and laboratory studies on MyChart before your provider has had a chance to review them.  We understand that in some cases there may be results that are confusing or concerning to you. Not all laboratory results come back in the same time frame and the provider may be waiting for multiple results in order to interpret others.  Please give [place] 48 hours in order for your provider to thoroughly review all the results before contacting the office for clarification of your results.   Thank you for trusting me with your gastrointestinal care!   Alcide Evener, CRNP

## 2022-12-12 NOTE — Progress Notes (Signed)
12/12/2022 Alison Garrett [id number] 12-18-1945   CHIEF COMPLAINT: Elevated LFTs  HISTORY OF PRESENT ILLNESS: Alison Garrett is a 77 year old female with a past medical history of hypertension, atrial fibrillation not on anticoagulation, hypertension, CKD, fibromyalgia, essential tremor, IBS and hyperplastic colon polyps. Past hysterectomy. She presents to our office today as referred by Dr. Ivin Booty Dettinger for further evaluation regarding mildly elevated LFTs since 11/2021. She denies any having any history of liver disease.  No known family history of liver disease.  She denies having any nausea or vomiting.  No upper or lower abdominal pain.  She has infrequent heartburn.  No dysphagia.  She is passing normal formed brown bowel movement most days and occasionally has nonbloody diarrhea.  She underwent a colonoscopy 03/2003 by Dr. Russella Dar which identified 4 hyperplastic polyps removed from the sigmoid colon.  She endorsed completing a Cologuard test 6 to 7 months ago which she reported was negative, results not in care everywhere or epic.  She takes ASA 81mg  every day.  No alcohol use.  No drug use.  She eats a high carbohydrate diet.     Latest Ref Rng & Units 11/28/2022    9:12 AM 06/06/2022    8:32 AM 11/15/2021    9:17 AM  Hepatic Function  Total Protein 6.0 - 8.5 g/dL 6.7  6.9  6.8   Albumin 3.8 - 4.8 g/dL 3.9  3.9  4.1   AST 0 - 40 IU/L 41  38  32   ALT 0 - 32 IU/L 46  42  38   Alk Phosphatase 44 - 121 IU/L 98  122  121   Total Bilirubin 0.0 - 1.2 mg/dL 0.9  0.8  0.7        Latest Ref Rng & Units 11/28/2022    9:12 AM 06/06/2022    8:32 AM 11/15/2021    9:17 AM  CBC  WBC 3.4 - 10.8 x10E3/uL 7.2  6.3  6.1   Hemoglobin 11.1 - 15.9 g/dL 08.6  57.8  46.9   Hematocrit 34.0 - 46.6 % 45.0  45.1  43.2   Platelets 150 - 450 x10E3/uL 248  278  248   Hep C antibody 11/05/2018  Colonoscopy 03/26/2003 by Dr. Russella Dar: 4 hyperplastic polyps measuring 3 - 4 mm removed from the sigmoid  colon  EGD 03/26/2003: Reflux esophagitis   Past Medical History:  Diagnosis Date   Afib (HCC)    Complication of anesthesia    propathal    Dyslipidemia    Essential tremor 01/12/2014   Fibromyalgia    Not currently under active treatment   GERD (gastroesophageal reflux disease)    Hiatal hernia    Hypertension    Obesity    Past Surgical History:  Procedure Laterality Date   ABDOMINAL HYSTERECTOMY     SVT ABLATION N/A 12/19/2018   Procedure: SVT ABLATION;  Surgeon: Regan Lemming, MD;  Location: [facility];  Service: Cardiovascular;  Laterality: N/A;   Social History: She is divorced.  She has 2 sons and 1 daughter.  She works for the [employer] x 35 years  Nonsmoker.  No alcohol use.  No drug use.  Family History:  Mother with history of heart disease and esophageal cancer, she was a smoker. No known family history of esophageal, gastric or colon cancer.  Father with COPD.  Sister with diabetes.  Allergies: Cipro, Contrast media, Diltiazem, Metoprolol, Penicillins, Nitrofurantoin, Sulfonamide, Benicar, latex,  Celebrex, Cymbalta, Norvasc, Prozac, Zoloft and Zyprexa    Outpatient Encounter Medications as of 12/12/2022  Medication Sig   aspirin 81 MG EC tablet Take 81 mg by mouth daily. Swallow whole.   hydrochlorothiazide (HYDRODIURIL) 25 MG tablet Take 1 tablet (25 mg total) by mouth daily.   No facility-administered encounter medications on file as of 12/12/2022.   REVIEW OF SYSTEMS:  Gen: Denies fever, sweats or chills. No weight loss.  CV: Denies chest pain, palpitations or edema. Resp: Denies cough, shortness of breath of hemoptysis.  GI: See HPI. GU: Denies urinary burning, blood in urine, increased urinary frequency or incontinence. MS:+ Back pain. Derm: Denies rash, itchiness, skin lesions or unhealing ulcers. Psych: Denies depression, anxiety, memory loss or confusion. Heme: Denies bruising, easy bleeding. Neuro:  Denies  headaches, dizziness or paresthesias. Endo:  Denies any problems with DM, thyroid or adrenal function.  PHYSICAL EXAM: BP 116/70 (BP Location: Left Arm, Patient Position: Sitting, Cuff Size: Normal)   Pulse 88   Ht 5\' 10"  (1.778 m)   Wt 206 lb 4 oz (93.6 kg)   SpO2 90%   BMI 29.59 kg/m   Wt Readings from Last 3 Encounters:  12/12/22 206 lb 4 oz (93.6 kg)  11/28/22 208 lb (94.3 kg)  06/12/22 210 lb (95.3 kg)    General: 77 year old female in no acute distress. Head: Normocephalic and atraumatic. Eyes:  Sclerae non-icteric, conjunctive pink. Ears: Normal auditory acuity. Mouth: Dentition intact. No ulcers or lesions. Right tonsil enlarged with white rubbery appearing tissue/questionable scar with smaller left tonsil with small area of white discoloration without obvious tonsillar stone. Neck: Supple, no lymphadenopathy or thyromegaly.  Lungs: Clear bilaterally to auscultation without wheezes, crackles or rhonchi. Heart: Regular rate and rhythm. No murmur, rub or gallop appreciated.  Abdomen: Soft, nontender, nondistended. No masses. No hepatosplenomegaly. Normoactive bowel sounds x 4 quadrants.  Rectal: Deferred.  Musculoskeletal: Symmetrical with no gross deformities. Skin: Warm and dry. No rash or lesions on visible extremities. Extremities: No edema. Neurological: Alert oriented x 4, no focal deficits.  Psychological:  Alert and cooperative. Normal mood and affect.  ASSESSMENT AND PLAN:  77 year old female with elevated mildly elevated AST And ALT levels.  Suspect hepatic steatosis. -RUQ sonogram  -Patient encouraged to reduce the carbohydrates in her diet ie: Reduce bread/pasta/potatoes/rice and sweets, exercise as tolerated and lose weight to reduce the risk of developing fatty liver disease -Recheck LFTs in 4 to 6 weeks, however, if her RUQ sonogram shows any significant abnormal findings to the liver or biliary tree labs will be done sooner -If LFTs remain elevated, will  pursue comprehensive hepatology serologies to include hepatitis A total antibody, hepatitis B surface antigen, hepatitis B surface antibodies, hepatitis B core total antibody, hepatitis C antibody, ANA, AMA, SMA, IgG, alpha 1 antitrypsin, iron and ferritin level.  Colon cancer screening.  Colonoscopy 03/2003 identified 4 hyperplastic polyps removed from the sigmoid colon.  Patient reported undergoing a negative Cologuard test within the past year. -Patient to provide office with copy of Cologuard test  Abnormal tonsils on exam -Patient advised to follow-up with PCP, recommend ENT evaluation   CC:  Dettinger, Elige Radon, MD

## 2022-12-14 ENCOUNTER — Ambulatory Visit (HOSPITAL_COMMUNITY)
Admission: RE | Admit: 2022-12-14 | Discharge: 2022-12-14 | Disposition: A | Discharge status: Home / Self Care | Payer: Medicare HMO | Source: Ambulatory Visit | Attending: Nurse Practitioner

## 2022-12-14 DIAGNOSIS — R7989 Other specified abnormal findings of blood chemistry: Secondary | ICD-10-CM | POA: Insufficient documentation

## 2023-02-07 ENCOUNTER — Telehealth: Payer: Self-pay | Admitting: Nurse Practitioner

## 2023-02-07 NOTE — Telephone Encounter (Signed)
Alison Garrett, please contact the patient and let her know she is due to have her liver enzymes checked.  Please send her to our lab at her convenience within the next 2 weeks for hepatic panel.  Thank you

## 2023-02-08 NOTE — Telephone Encounter (Signed)
Contacted patient and left a voicemail. 

## 2023-03-13 ENCOUNTER — Other Ambulatory Visit: Payer: Medicare HMO

## 2023-05-13 ENCOUNTER — Ambulatory Visit (INDEPENDENT_AMBULATORY_CARE_PROVIDER_SITE_OTHER): Admitting: Nurse Practitioner

## 2023-05-13 ENCOUNTER — Encounter: Payer: Self-pay | Admitting: Nurse Practitioner

## 2023-05-13 ENCOUNTER — Ambulatory Visit: Payer: Self-pay | Admitting: *Deleted

## 2023-05-13 VITALS — BP 125/79 | HR 81 | Temp 97.7°F | Ht 70.0 in | Wt 205.0 lb

## 2023-05-13 DIAGNOSIS — M109 Gout, unspecified: Secondary | ICD-10-CM | POA: Diagnosis not present

## 2023-05-13 MED ORDER — PREDNISONE 5 MG PO TABS: 5.0000 mg | ORAL_TABLET | Freq: Two times a day (BID) | ORAL | 0 refills | Status: DC

## 2023-05-13 NOTE — Progress Notes (Signed)
 Acute Office Visit  Subjective:     Patient ID: Alison Garrett, female    DOB: [birth year], 77 y.o.   MRN: [id number]  Chief Complaint  Patient presents with   Gout    Gout in left foot started hurting Friday     HPI Alison Garrett 78 year old female present May 13 2023 for an acute visit for gout flareup Gout: Patient here for evaluation of acute gouty arthritis. The patient reports no acute gout attacks since last clinic visit. Attacks occur primarily in the big toe. Patient reports her chronic pain is unchanged, her joint stiffness is unchanged and her joint swelling is unchanged. Limitation on activities include difficulty with walking. The patient is not avoiding high purine foods and reports consuming 0 alcoholic drinks per year. Had tried OTC Advil with no relive  Active Ambulatory Problems    Diagnosis Date Noted   Obesity (BMI 30.0-34.9) 12/01/2008   HYPERTENSION, BENIGN 12/01/2008   Paroxysmal A-fib (HCC) 07/11/2011   Hyperlipemia 09/17/2012   Vitamin D deficiency 09/17/2012   Fibromyalgia 09/17/2012   Metabolic syndrome 02/18/2013   Essential tremor 01/12/2014   CKD (chronic kidney disease) stage 2, GFR 60-89 ml/min 11/17/2014   Paresthesias 06/07/2015   History of pulmonary embolism 02/19/2018   Elevated LFTs 12/12/2022   Acute gout involving toe of left foot 05/13/2023   Resolved Ambulatory Problems    Diagnosis Date Noted   DYSLIPIDEMIA 12/01/2008   ABNORMAL ELECTROCARDIOGRAM 12/01/2008   Hypertension 09/17/2012   Fatigue 01/12/2014   Multiple drug allergies 08/11/2014   Atrial fibrillation with RVR (HCC) 11/16/2014   Chest pain 07/01/2016   SOB (shortness of breath) 05/22/2017   Chest pain with moderate risk for cardiac etiology 11/05/2017   Palpitations 11/27/2017   Urgency of urination 03/23/2020   Dysuria 03/28/2020   Pulmonary embolism without acute cor pulmonale (HCC) 05/10/2020   Past Medical History:  Diagnosis Date   Afib (HCC)     Complication of anesthesia    Dyslipidemia    GERD (gastroesophageal reflux disease)    Hiatal hernia    Obesity     Review of Systems  Constitutional:  Negative for chills and fever.  Cardiovascular:  Negative for chest pain and leg swelling.  Gastrointestinal:  Negative for constipation, diarrhea, nausea and vomiting.  Musculoskeletal:  Positive for joint pain.       Left big toe  Skin:  Negative for itching and rash.  Neurological:  Negative for dizziness and headaches.   Negative unless indicated in HPI    Objective:    BP 125/79   Pulse 81   Temp 97.7 F (36.5 C) (Temporal)   Ht 5\' 10"  (1.778 m)   Wt 205 lb (93 kg)   SpO2 97%   BMI 29.41 kg/m  BP Readings from Last 3 Encounters:  05/13/23 125/79  12/12/22 116/70  11/28/22 138/88   Wt Readings from Last 3 Encounters:  05/13/23 205 lb (93 kg)  12/12/22 206 lb 4 oz (93.6 kg)  11/28/22 208 lb (94.3 kg)      Physical Exam Vitals and nursing note reviewed.  Constitutional:      General: She is not in acute distress. HENT:     Head: Normocephalic and atraumatic.     Nose: Nose normal.     Mouth/Throat:     Mouth: Mucous membranes are moist.  Eyes:     Extraocular Movements: Extraocular movements intact.     Conjunctiva/sclera: Conjunctivae normal.  Pupils: Pupils are equal, round, and reactive to light.  Cardiovascular:     Heart sounds: Normal heart sounds.  Pulmonary:     Effort: Pulmonary effort is normal.     Breath sounds: Normal breath sounds.  Musculoskeletal:     Right foot: Normal.     Left foot: Tenderness present.  Skin:    General: Skin is warm and dry.  Neurological:     Mental Status: She is alert and oriented to person, place, and time.  Psychiatric:        Mood and Affect: Mood normal.        Behavior: Behavior normal.        Thought Content: Thought content normal.        Judgment: Judgment normal.     No results found for any visits on 05/13/23.      Assessment & Plan:   Acute gout involving toe of left foot, unspecified cause -     Uric acid -     predniSONE; Take 1 tablet (5 mg total) by mouth 2 (two) times daily with a meal.  Dispense: 10 tablet; Refill: 77  Alison Garrett 78 year old Caucasian female seen today for gout flareup, no acute distress Labs: Uric acid level result pending Prednisone 5 mg 1 tab twice daily #10 dispense  The nature of gout is fully explained, including dietary relationship, acute and interval phase and treatment of both. Long term complications such as kidney stones, tophi and arthritis are discussed. Avoidance of alcohol recommended, and written literature is given along with a low purine diet. Indications for the use of allopurinol for prophylaxis and the use of colchicine to prevent or treat flare-ups is also discussed. Proper use of indomethacin for acute attacks discussed, and its side effects. Call if further attacks occur, or this one does not resolve promptly.   All question answered, client instructed to call the clinic with any question or concern  The above assessment and management plan was discussed with the patient. The patient verbalized understanding of and has agreed to the management plan. Patient is aware to call the clinic if they develop any new symptoms or if symptoms persist or worsen. Patient is aware when to return to the clinic for a follow-up visit. Patient educated on when it is appropriate to go to the emergency department.  Return if symptoms worsen or fail to improve.  Alison Garrett, [facility] [address] [place], [place] [address] [phone]  Note: This document was prepared by [employer] voice dictation technology and any errors that results from this process are unintentional.

## 2023-05-13 NOTE — Telephone Encounter (Signed)
  Chief Complaint: foot pain- gout flare Symptoms: pain started in great toe- swelling, now has moved into other toes Frequency: started Friday Pertinent Negatives: Patient denies leg pain, rash, fever, numbness Disposition: [] ED /[] Urgent Care (no appt availability in office) / [x] Appointment(In office/virtual)/ []  [facility]/ [] Home Care/ [] Refused Recommended Disposition /[] [place] [facility]/ []  Follow-up with PCP Additional Notes: Patient states she is having reoccurring Gout - appointment scheduled   Copied from CRM [id number]. Topic: Clinical - Red Word Triage >> May 13, 2023  2:27 PM Fonda Kinder J wrote: Red Word that prompted transfer to Nurse Triage: Swollen foot Reason for Disposition  [1] SEVERE pain (e.g., excruciating, unable to do any normal activities) AND [2] not improved after 2 hours of pain medicine  Answer Assessment - Initial Assessment Questions 1. ONSET: "When did the pain start?"      Friday 2. LOCATION: "Where is the pain located?"      Big toe- other toes swelling now 3. PAIN: "How bad is the pain?"    (Scale 1-10; or mild, moderate, severe)  - MILD (1-3): doesn't interfere with normal activities.   - MODERATE (4-7): interferes with normal activities (e.g., work or school) or awakens from sleep, limping.   - SEVERE (8-10): excruciating pain, unable to do any normal activities, unable to walk.      8/10 4. WORK OR EXERCISE: "Has there been any recent work or exercise that involved this part of the body?"      no 5. CAUSE: "What do you think is causing the foot pain?"     Gout 6. OTHER SYMPTOMS: "Do you have any other symptoms?" (e.g., leg pain, rash, fever, numbness)     Red, sharp stabbing pain  Protocols used: Foot Pain-A-AH

## 2023-05-14 LAB — URIC ACID: Uric Acid: 9.2 mg/dL — ABNORMAL HIGH (ref 3.1–7.9)

## 2023-06-05 ENCOUNTER — Ambulatory Visit: Payer: Medicare HMO | Admitting: Family Medicine

## 2023-06-05 ENCOUNTER — Encounter: Payer: Self-pay | Admitting: Family Medicine

## 2023-06-05 VITALS — BP 139/88 | HR 68 | Ht 70.0 in | Wt 205.0 lb

## 2023-06-05 DIAGNOSIS — I10 Essential (primary) hypertension: Secondary | ICD-10-CM | POA: Diagnosis not present

## 2023-06-05 DIAGNOSIS — Z0000 Encounter for general adult medical examination without abnormal findings: Secondary | ICD-10-CM | POA: Diagnosis not present

## 2023-06-05 DIAGNOSIS — N182 Chronic kidney disease, stage 2 (mild): Secondary | ICD-10-CM

## 2023-06-05 DIAGNOSIS — Z0001 Encounter for general adult medical examination with abnormal findings: Secondary | ICD-10-CM | POA: Diagnosis not present

## 2023-06-05 DIAGNOSIS — E7800 Pure hypercholesterolemia, unspecified: Secondary | ICD-10-CM

## 2023-06-05 DIAGNOSIS — M109 Gout, unspecified: Secondary | ICD-10-CM

## 2023-06-05 LAB — LIPID PANEL

## 2023-06-05 MED ORDER — HYDROCHLOROTHIAZIDE 25 MG PO TABS: 25.0000 mg | ORAL_TABLET | Freq: Every day | ORAL | 3 refills | Status: AC

## 2023-06-05 NOTE — Progress Notes (Signed)
 BP 139/88   Pulse 68   Ht 5\' 10"  (1.778 m)   Wt 205 lb (93 kg)   SpO2 98%   BMI 29.41 kg/m    Subjective:   Patient ID: Alison Garrett, female    DOB: August 24, 1945, 78 y.o.   MRN: [id number]  HPI: Alison Garrett is a 78 y.o. female presenting on 06/05/2023 for Medical Management of Chronic Issues, Hypertension, and Gout (feet)   HPI Physical exam Patient denies any chest pain, shortness of breath, headaches or vision issues, abdominal complaints, diarrhea, nausea, vomiting, or joint issues.  Patient declines vaccinations and mammograms and breast and pelvic exam but says she feels like everything is doing well.  Hypertension Patient is currently on hydrochlorothiazide , and their blood pressure today is 139/88. Patient denies any lightheadedness or dizziness. Patient denies headaches, blurred vision, chest pains, shortness of breath, or weakness. Denies any side effects from medication and is content with current medication.   Hyperlipidemia Patient is coming in for recheck of his hyperlipidemia. The patient is currently taking no medication currently. They deny any issues with myalgias or history of liver damage from it. They deny any focal numbness or weakness or chest pain.   CKD recheck Patient has mild CKD stage II, we are rechecking her levels today.  Gout Last attack: A month ago Attacks this year: 4 or 5 Medication: Advil as needed Location of attacks: Both feet, more right than left  Relevant past medical, surgical, family and social history reviewed and updated as indicated. Interim medical history since our last visit reviewed. Allergies and medications reviewed and updated.  Review of Systems  Constitutional:  Negative for chills and fever.  HENT:  Negative for congestion, ear discharge and ear pain.   Eyes:  Negative for redness and visual disturbance.  Respiratory:  Negative for chest tightness and shortness of breath.   Cardiovascular:  Negative for chest pain  and leg swelling.  Genitourinary:  Negative for difficulty urinating and dysuria.  Musculoskeletal:  Positive for arthralgias. Negative for back pain and gait problem.  Skin:  Negative for rash.  Neurological:  Negative for light-headedness and headaches.  Psychiatric/Behavioral:  Negative for agitation and behavioral problems.   All other systems reviewed and are negative.   Per HPI unless specifically indicated above   Allergies as of 06/05/2023       Reactions   Ciprofloxacin  Shortness Of Breath, Rash   Contrast Media [iodinated Contrast Media] Anaphylaxis   Diltiazem  Other (See Comments)   Pt. Had pain, jitters and felt terrible   Metoprolol  Nausea And Vomiting   Penicillins Anaphylaxis   Has patient had a PCN reaction causing immediate rash, facial/tongue/throat swelling, SOB or lightheadedness with hypotension: No Has patient had a PCN reaction causing severe rash involving mucus membranes or skin necrosis: Noyes Has patient had a PCN reaction that required hospitalization Nounknown Has patient had a PCN reaction occurring within the last 10 years: Nono If all of the above answers are "NO", then may proceed with Cephalo   Nitrofuran Derivatives Nausea And Vomiting   Sulfonamide Derivatives Hives   Prednisone  Other (See Comments)   Depression   Benicar  [olmesartan ] Rash   Celebrex [celecoxib] Anxiety   Cymbalta [duloxetine Hcl] Itching   Latex Rash   Neurontin [gabapentin] Nausea Only   Norvasc [amlodipine Besylate] Swelling   Prozac [fluoxetine Hcl] Other (See Comments)   Xanax [alprazolam] Other (See Comments)   Zoloft [sertraline Hcl] Itching   Zyprexa [olanzapine] Other (See  Comments)   Wt gain        Medication List        Accurate as of June 05, 2023  8:44 AM. If you have any questions, ask your nurse or doctor.          STOP taking these medications    predniSONE  5 MG tablet Commonly known as: DELTASONE  Stopped by: Lucio Sabin Meiling Hendriks        TAKE these medications    aspirin  EC 81 MG tablet Take 81 mg by mouth daily. Swallow whole.   hydrochlorothiazide  25 MG tablet Commonly known as: HYDRODIURIL  Take 1 tablet (25 mg total) by mouth daily.         Objective:   BP 139/88   Pulse 68   Ht 5\' 10"  (1.778 m)   Wt 205 lb (93 kg)   SpO2 98%   BMI 29.41 kg/m   Wt Readings from Last 3 Encounters:  06/05/23 205 lb (93 kg)  05/13/23 205 lb (93 kg)  12/12/22 206 lb 4 oz (93.6 kg)    Physical Exam Vitals and nursing note reviewed.  Constitutional:      General: She is not in acute distress.    Appearance: She is well-developed. She is not diaphoretic.  HENT:     Right Ear: Tympanic membrane normal.     Left Ear: Tympanic membrane normal.     Mouth/Throat:     Mouth: Mucous membranes are moist.     Pharynx: Oropharynx is clear. No oropharyngeal exudate or posterior oropharyngeal erythema.  Eyes:     Conjunctiva/sclera: Conjunctivae normal.  Cardiovascular:     Rate and Rhythm: Normal rate and regular rhythm.     Heart sounds: Normal heart sounds. No murmur heard. Pulmonary:     Effort: Pulmonary effort is normal. No respiratory distress.     Breath sounds: Normal breath sounds. No wheezing.  Abdominal:     General: Abdomen is flat. Bowel sounds are normal. There is no distension.     Palpations: Abdomen is soft.     Tenderness: There is no abdominal tenderness. There is no guarding or rebound.     Hernia: No hernia is present.  Musculoskeletal:        General: No swelling or deformity.  Skin:    General: Skin is warm and dry.     Findings: No rash.  Neurological:     Mental Status: She is alert and oriented to person, place, and time.     Coordination: Coordination normal.  Psychiatric:        Behavior: Behavior normal.       Assessment & Plan:   Problem List Items Addressed This Visit       Cardiovascular and Mediastinum   HYPERTENSION, BENIGN     Genitourinary   CKD (chronic kidney  disease) stage 2, GFR 60-89 ml/min     Other   Hyperlipemia   Gout   Other Visit Diagnoses       Physical exam    -  Primary       For gout offered allopurinol but she is going to try it with diet for now  Blood pressure looks good, no changes there.  Will check blood work today Follow up plan: Return in about 6 months (around 12/05/2023), or if symptoms worsen or fail to improve, for Hyperlipidemia and CKD.  Counseling provided for all of the vaccine components No orders of the defined types were placed in this encounter.  Jolyne Needs, MD [facility] 06/05/2023, 8:44 AM

## 2023-06-06 LAB — LIPID PANEL
Cholesterol, Total: 178 mg/dL (ref 100–199)
HDL: 36 mg/dL — ABNORMAL LOW (ref >39)
LDL CALC COMMENT:: 4.9 ratio — ABNORMAL HIGH (ref 0.0–4.4)
LDL Chol Calc (NIH): 113 mg/dL — ABNORMAL HIGH (ref 0–99)
Triglycerides: 163 mg/dL — ABNORMAL HIGH (ref 0–149)
VLDL Cholesterol Cal: 29 mg/dL (ref 5–40)

## 2023-06-06 LAB — CBC WITH DIFFERENTIAL/PLATELET
Basophils Absolute: 0.1 10*3/uL (ref 0.0–0.2)
Basos: 1 %
EOS (ABSOLUTE): 0.1 10*3/uL (ref 0.0–0.4)
Eos: 1 %
Hematocrit: 44.1 % (ref 34.0–46.6)
Hemoglobin: 15 g/dL (ref 11.1–15.9)
Immature Grans (Abs): 0 10*3/uL (ref 0.0–0.1)
Immature Granulocytes: 0 %
Lymphocytes Absolute: 1.7 10*3/uL (ref 0.7–3.1)
Lymphs: 22 %
MCH: 29.5 pg (ref 26.6–33.0)
MCHC: 34 g/dL (ref 31.5–35.7)
MCV: 87 fL (ref 79–97)
Monocytes Absolute: 0.7 10*3/uL (ref 0.1–0.9)
Monocytes: 9 %
Neutrophils Absolute: 5.1 10*3/uL (ref 1.4–7.0)
Neutrophils: 67 %
Platelets: 291 10*3/uL (ref 150–450)
RBC: 5.09 x10E6/uL (ref 3.77–5.28)
RDW: 12.3 % (ref 11.7–15.4)
WBC: 7.6 10*3/uL (ref 3.4–10.8)

## 2023-06-06 LAB — CMP14+EGFR
ALT: 28 IU/L (ref 0–32)
AST: 27 IU/L (ref 0–40)
Albumin: 3.7 g/dL — ABNORMAL LOW (ref 3.8–4.8)
Alkaline Phosphatase: 127 IU/L — ABNORMAL HIGH (ref 44–121)
BUN/Creatinine Ratio: 13 (ref 12–28)
BUN: 14 mg/dL (ref 8–27)
Bilirubin Total: 0.8 mg/dL (ref 0.0–1.2)
CO2: 28 mmol/L (ref 20–29)
Calcium: 9.9 mg/dL (ref 8.7–10.3)
Chloride: 97 mmol/L (ref 96–106)
Creatinine, Ser: 1.07 mg/dL — ABNORMAL HIGH (ref 0.57–1.00)
Globulin, Total: 2.9 g/dL (ref 1.5–4.5)
Glucose: 128 mg/dL — ABNORMAL HIGH (ref 70–99)
Potassium: 4.4 mmol/L (ref 3.5–5.2)
Sodium: 138 mmol/L (ref 134–144)
Total Protein: 6.6 g/dL (ref 6.0–8.5)
eGFR: 54 mL/min/{1.73_m2} — ABNORMAL LOW (ref >59)

## 2023-06-06 LAB — URIC ACID: Uric Acid: 8.6 mg/dL — ABNORMAL HIGH (ref 3.1–7.9)

## 2023-06-13 ENCOUNTER — Encounter: Payer: Self-pay | Admitting: Family Medicine

## 2023-06-24 NOTE — Progress Notes (Unsigned)
 Cardiology Office Note:   Date:  06/26/2023  ID:  Alison Garrett, DOB 1945-04-26, MRN [id number] PCP: Dettinger, Lucio Sabin, MD  [facility] Providers Cardiologist:  Eilleen Grates, MD {  History of Present Illness:   Alison Garrett is a 78 y.o. female who presents for follow up of atrial fibrillation.  She was in the hospital in Sept 2019 for chest pain and was found to have a small PE.  She was sent home on anticoagulation. She wore a ZIO patch and was found to have SVT for 23 seconds.  She is intolerant of metoprolol .  She has multiple intolerances.  She did see Dr. Lawana Pray and was offered an SVT ablation.  She did not want to take anticoagulation.    She presents with chest pain.  She says that this has been going on for a year.  She reports that it is sporadic.  It is more like a pressure.  It happens sometimes with activity.  It is somewhat sporadic however.  Might happen once a week or so.  She might be able to bring it on with activities.  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  She gets more short of breath with this.  She has had some chronic shortness of breath but seems to indicate that this is worse.  She says it does not radiate to her jaw or to her arms.  She has not had any new resting shortness of breath, PND or orthopnea.  She has had no palpitations, presyncope or syncope.  She has had a couple of occasions of dizziness unrelated to the pain.  She has had a couple of episodes of her legs feeling weak unrelated to the chest symptoms.  She has hade some low BPs and reduced her hydrochlorothiazide  recently.    ROS: Positive for gout. Otherwise as stated in the HPI and negative for all other systems.   Studies Reviewed:    EKG:   EKG Interpretation Date/Time:  Wednesday Jun 26 2023 09:15:35 EDT Ventricular Rate:  67 PR Interval:  204 QRS Duration:  88 QT Interval:  416 QTC Calculation: 439 R Axis:   6  Text Interpretation: Normal  sinus rhythm Nonspecific ST and T wave abnormality When compared with ECG of 01-Apr-2021 03:02, No significant change since last tracing Confirmed by Eilleen Grates ([id number]) on 06/26/2023 9:30:23 AM    Risk Assessment/Calculations:    CHA2DS2-VASc Score = 4   This indicates a 4.8% annual risk of stroke. The patient's score is based upon: CHF History: 0 HTN History: 1 Diabetes History: 0 Stroke History: 0 Vascular Disease History: 0 Age Score: 2 Gender Score: 1   Physical Exam:   VS:  BP (!) 158/98   Pulse 67   Ht 5\' 10"  (1.778 m)   Wt 205 lb (93 kg)   BMI 29.41 kg/m    Wt Readings from Last 3 Encounters:  06/26/23 205 lb (93 kg)  06/05/23 205 lb (93 kg)  05/13/23 205 lb (93 kg)     GEN: Well nourished, well developed in no acute distress NECK: No JVD; No carotid bruits CARDIAC: RRR, no murmurs, rubs, gallops RESPIRATORY:  Clear to auscultation without rales, wheezing or rhonchi  ABDOMEN: Soft, non-tender, non-distended EXTREMITIES:  No edema; No deformity , dependent rubor  ASSESSMENT AND PLAN:   ATRIAL FIB:    The patient has had no symptomatic palpitations.  She has not wanted  to take anticoagulation in the past and understands the risks of this.   HTN:   Her BP is elevated.  I suggested that she go back to the full HCTZ and check her blood pressure couple times a day.   PULMONARY EMBOLISM: Certainly this is a concern to explain her chest pain and shortness of breath.  I am going to order a CT PE protocol.  She will have contrast prophylaxis.  CHEST PAIN: See above.  The patient had minimal coronary artery plaque in 2019.  Her EKG is unremarkable.  She is able to do significant exerting work without bringing on the symptoms routinely.  I not strongly suspecting obstructive coronary disease but will reassess based on the results of the CT.    Follow up with me in 3 months.   Signed, Eilleen Grates, MD

## 2023-06-26 ENCOUNTER — Encounter: Payer: Self-pay | Admitting: Cardiology

## 2023-06-26 ENCOUNTER — Ambulatory Visit (INDEPENDENT_AMBULATORY_CARE_PROVIDER_SITE_OTHER): Admitting: Cardiology

## 2023-06-26 VITALS — BP 158/98 | HR 67 | Ht 70.0 in | Wt 205.0 lb

## 2023-06-26 DIAGNOSIS — R002 Palpitations: Secondary | ICD-10-CM | POA: Diagnosis not present

## 2023-06-26 DIAGNOSIS — I10 Essential (primary) hypertension: Secondary | ICD-10-CM

## 2023-06-26 DIAGNOSIS — Z86711 Personal history of pulmonary embolism: Secondary | ICD-10-CM

## 2023-06-26 DIAGNOSIS — I480 Paroxysmal atrial fibrillation: Secondary | ICD-10-CM

## 2023-06-26 DIAGNOSIS — R079 Chest pain, unspecified: Secondary | ICD-10-CM | POA: Diagnosis not present

## 2023-06-26 MED ORDER — DIPHENHYDRAMINE HCL 50 MG PO TABS: 50.0000 mg | ORAL_TABLET | ORAL | 0 refills | Status: DC

## 2023-06-26 MED ORDER — PREDNISONE 50 MG PO TABS: ORAL_TABLET | ORAL | 0 refills | Status: DC

## 2023-06-26 NOTE — Patient Instructions (Addendum)
 Medication Instructions:  Your physician recommends that you continue on your current medications as directed. Please refer to the Current Medication list given to you today. For your CT, please take prednisone  50 mg 13 hours before; 7 hours before and 1-2 hours before your CT. Take Benadryl  50 mg 1-2 hours before your CT.  Labwork: none  Testing/Procedures: CT Angio Chest PE Protocol  Follow-Up: Your physician recommends that you schedule a follow-up appointment in: 6 months  Any Other Special Instructions Will Be Listed Below (If Applicable).  If you need a refill on your cardiac medications before your next appointment, please call your pharmacy.

## 2023-06-27 ENCOUNTER — Ambulatory Visit (HOSPITAL_COMMUNITY)

## 2023-06-27 ENCOUNTER — Other Ambulatory Visit (HOSPITAL_COMMUNITY): Payer: Self-pay | Admitting: *Deleted

## 2023-06-27 MED ORDER — PREDNISONE 50 MG PO TABS: ORAL_TABLET | ORAL | 0 refills | Status: DC

## 2023-07-01 ENCOUNTER — Ambulatory Visit (HOSPITAL_BASED_OUTPATIENT_CLINIC_OR_DEPARTMENT_OTHER)
Admission: RE | Admit: 2023-07-01 | Discharge: 2023-07-01 | Disposition: A | Discharge status: Home / Self Care | Source: Ambulatory Visit | Attending: Cardiology | Admitting: Cardiology

## 2023-07-01 DIAGNOSIS — R079 Chest pain, unspecified: Secondary | ICD-10-CM | POA: Insufficient documentation

## 2023-07-01 DIAGNOSIS — Z86711 Personal history of pulmonary embolism: Secondary | ICD-10-CM | POA: Diagnosis not present

## 2023-07-01 DIAGNOSIS — I700 Atherosclerosis of aorta: Secondary | ICD-10-CM | POA: Diagnosis not present

## 2023-07-01 DIAGNOSIS — I2699 Other pulmonary embolism without acute cor pulmonale: Secondary | ICD-10-CM | POA: Diagnosis not present

## 2023-07-01 MED ORDER — IOHEXOL 350 MG/ML SOLN
100.0000 mL | Freq: Once | INTRAVENOUS | Status: AC | PRN
  Administered 2023-07-01: 80 mL via INTRAVENOUS

## 2023-07-02 ENCOUNTER — Ambulatory Visit: Payer: Self-pay | Admitting: Cardiology

## 2023-07-02 ENCOUNTER — Ambulatory Visit (INDEPENDENT_AMBULATORY_CARE_PROVIDER_SITE_OTHER): Payer: Medicare HMO

## 2023-07-02 VITALS — BP 127/75 | HR 67 | Ht 70.0 in | Wt 205.0 lb

## 2023-07-02 DIAGNOSIS — Z0000 Encounter for general adult medical examination without abnormal findings: Secondary | ICD-10-CM | POA: Diagnosis not present

## 2023-07-02 NOTE — Patient Instructions (Signed)
 [name] , Thank you for taking time out of your busy schedule to complete your Annual Wellness Visit with me. I enjoyed our conversation and look forward to speaking with you again next year. I, as well as your care team,  appreciate your ongoing commitment to your health goals. Please review the following plan we discussed and let me know if I can assist you in the future. Your Game plan/ To Do List    Follow up Visits: Next Medicare AWV with our clinical staff: Thursday, 07/03/23 at 3:50p.m   Next Office Visit with your provider: 12/05/23 at 7:55 a.m.  Clinician Recommendations:  Aim for 30 minutes of exercise or brisk walking, 6-8 glasses of water, and 5 servings of fruits and vegetables each day. N/a      This is a list of the screening recommended for you and due dates:  Health Maintenance  Topic Date Due   Pneumonia Vaccine (1 of 1 - PCV) Never done   DEXA scan (bone density measurement)  05/23/2021   Zoster (Shingles) Vaccine (1 of 2) 11/28/2023*   Mammogram  12/06/2023*   COVID-19 Vaccine (1 - 2024-25 season) 07/17/2024*   Flu Shot  09/13/2023   Medicare Annual Wellness Visit  07/01/2024   DTaP/Tdap/Td vaccine (2 - Td or Tdap) 08/24/2030   Hepatitis C Screening  Completed   HPV Vaccine  Aged Out   Meningitis B Vaccine  Aged Out   Colon Cancer Screening  Discontinued  *Topic was postponed. The date shown is not the original due date.    Advanced directives: (Declined) Advance directive discussed with you today. Even though you declined this today, please call our office should you change your mind, and we can give you the proper paperwork for you to fill out. Advance Care Planning is important because it:  [x]  Makes sure you receive the medical care that is consistent with your values, goals, and preferences  [x]  It provides guidance to your family and loved ones and reduces their decisional burden about whether or not they are making the right decisions based on your  wishes.  Follow the link provided in your after visit summary or read over the paperwork we have mailed to you to help you started getting your Advance Directives in place. If you need assistance in completing these, please reach out to us  so that we can help you!  See attachments for Preventive Care and Fall Prevention Tips.

## 2023-07-02 NOTE — Progress Notes (Signed)
 Subjective:   Alison Garrett is a 78 y.o. who presents for a Medicare Wellness preventive visit.  As a reminder, Annual Wellness Visits don't include a physical exam, and some assessments may be limited, especially if this visit is performed virtually. We may recommend an in-person follow-up visit with your provider if needed.  Visit Complete: Virtual I connected with  Alison Garrett on 07/02/23 by a audio enabled telemedicine application and verified that I am speaking with the correct person using two identifiers.  Patient Location: Home  Provider Location: Home Office  I discussed the limitations of evaluation and management by telemedicine. The patient expressed understanding and agreed to proceed.  Vital Signs: Because this visit was a virtual/telehealth visit, some criteria may be missing or patient reported. Any vitals not documented were not able to be obtained and vitals that have been documented are patient reported.  VideoDeclined- This patient declined Librarian, academic. Therefore the visit was completed with audio only.  Persons Participating in Visit: Patient.  AWV Questionnaire: No: Patient Medicare AWV questionnaire was not completed prior to this visit.  Cardiac Risk Factors include: advanced age (>40men, >51 women);hypertension;dyslipidemia     Objective:     Today's Vitals   07/02/23 1439  BP: 127/75  Pulse: 67  Weight: 205 lb (93 kg)  Height: 5\' 10"  (1.778 m)   Body mass index is 29.41 kg/m.     07/02/2023    2:44 PM 06/12/2022   11:18 AM 06/08/2021   12:03 PM 04/01/2021    7:59 AM 05/26/2020    1:04 PM 03/23/2019    2:58 PM 02/22/2019   12:44 PM  Advanced Directives  Does Patient Have a Medical Advance Directive? No No No No No No No  Would patient like information on creating a medical advance directive?  No - Patient declined No - Patient declined  Yes (MAU/Ambulatory/Procedural Areas - Information given)  No - Patient  declined    Current Medications (verified) Outpatient Encounter Medications as of 07/02/2023  Medication Sig   aspirin  81 MG EC tablet Take 81 mg by mouth daily. Swallow whole.   diphenhydrAMINE  (BENADRYL ) 50 MG tablet Take 1 tablet (50 mg total) by mouth as directed. Take 1-2 hours before CT   hydrochlorothiazide  (HYDRODIURIL ) 25 MG tablet Take 1 tablet (25 mg total) by mouth daily.   predniSONE  (DELTASONE ) 50 MG tablet Take 50 mg 13 hours before CT; then 7 hours before CT; then 1-2 hours before CT   No facility-administered encounter medications on file as of 07/02/2023.    Allergies (verified) Ciprofloxacin , Contrast media [iodinated contrast media], Diltiazem , Metoprolol , Penicillins, Nitrofuran derivatives, Sulfonamide derivatives, Prednisone , Benicar  [olmesartan ], Celebrex [celecoxib], Cymbalta [duloxetine hcl], Latex, Neurontin [gabapentin], Norvasc [amlodipine besylate], Prozac [fluoxetine hcl], Xanax [alprazolam], Zoloft [sertraline hcl], and Zyprexa [olanzapine]   History: Past Medical History:  Diagnosis Date   Afib (HCC)    Complication of anesthesia    propathal    Dyslipidemia    Essential tremor 01/12/2014   Fibromyalgia    Not currently under active treatment   GERD (gastroesophageal reflux disease)    Hiatal hernia    Hypertension    Obesity    Past Surgical History:  Procedure Laterality Date   ABDOMINAL HYSTERECTOMY     SVT ABLATION N/A 12/19/2018   Procedure: SVT ABLATION;  Surgeon: Lei Pump, MD;  Location: [facility];  Service: Cardiovascular;  Laterality: N/A;   Family History  Problem Relation Age of  Onset   Coronary artery disease Mother    Heart disease Mother        CABG   Cancer Mother        trachea   COPD Father    Hepatitis Sister        autoimune   Diabetes Sister    Social History   Socioeconomic History   Marital status: Divorced    Spouse name: Not on file   Number of children: 3   Years of education: some  college   Highest education level: 12th grade  Occupational History    Employer: Licensed conveyancer     Comment: full time  Tobacco Use   Smoking status: Never   Smokeless tobacco: Never  Vaping Use   Vaping status: Never Used  Substance and Sexual Activity   Alcohol use: No    Alcohol/week: 0.0 standard drinks of alcohol   Drug use: No   Sexual activity: Not Currently  Other Topics Concern   Not on file  Social History Narrative   Lives in [place].   Patient is right handed   Drinks very little caffeine.   Works every day but Wednesday and Sunday   Lives alone.   Social Drivers of Corporate investment banker Strain: Low Risk  (07/02/2023)   Overall Financial Resource Strain (CARDIA)    Difficulty of Paying Living Expenses: Not hard at all  Food Insecurity: No Food Insecurity (07/02/2023)   Hunger Vital Sign    Worried About Running Out of Food in the Last Year: Never true    Ran Out of Food in the Last Year: Never true  Transportation Needs: No Transportation Needs (07/02/2023)   PRAPARE - Administrator, Civil Service (Medical): No    Lack of Transportation (Non-Medical): No  Physical Activity: Inactive (07/02/2023)   Exercise Vital Sign    Days of Exercise per Week: 0 days    Minutes of Exercise per Session: 30 min  Stress: No Stress Concern Present (07/02/2023)   [employer] of Occupational Health - Occupational Stress Questionnaire    Feeling of Stress : Not at all  Social Connections: Socially Isolated (07/02/2023)   Social Connection and Isolation Panel [NHANES]    Frequency of Communication with Friends and Family: More than three times a week    Frequency of Social Gatherings with Friends and Family: More than three times a week    Attends Religious Services: Never    Database administrator or Organizations: No    Attends Engineer, structural: Never    Marital Status: Divorced    Tobacco Counseling Counseling given:  Yes    Clinical Intake:  Pre-visit preparation completed: Yes  Pain : No/denies pain     BMI - recorded: 29.41 Nutritional Status: BMI 25 -29 Overweight Nutritional Risks: None Diabetes: No  Lab Results  Component Value Date   HGBA1C 5.7 11/05/2018   HGBA1C 5.7 12/18/2017   HGBA1C 5.5 02/18/2013     How often do you need to have someone help you when you read instructions, pamphlets, or other written materials from your doctor or pharmacy?: 1 - Never  Interpreter Needed?: No  Information entered by :: Alia t/cma   Activities of Daily Living     07/02/2023    2:42 PM  In your present state of health, do you have any difficulty performing the following activities:  Hearing? 0  Vision? 0  Difficulty concentrating or making decisions? 0  Walking or climbing stairs? 0  Dressing or bathing? 0  Doing errands, shopping? 0  Preparing Food and eating ? N  Using the Toilet? N  In the past six months, have you accidently leaked urine? Y  Do you have problems with loss of bowel control? N  Managing your Medications? N  Managing your Finances? N  Housekeeping or managing your Housekeeping? N    Patient Care Team: Dettinger, Lucio Sabin, MD as PCP - General (Family Medicine) Eilleen Grates, MD as PCP - Cardiology (Cardiology) Alexia Idler, OD (Optometry)  Indicate any recent Medical Services you may have received from other than [facility] providers in the past year (date may be approximate).     Assessment:    This is a routine wellness examination for Akeyla.  Hearing/Vision screen Hearing Screening - Comments:: Pt denies hearing dif Vision Screening - Comments:: Pt denies vision dif/pt goes [facility] [address]   Goals Addressed             This Visit's Progress    Patient Stated       [place]       Depression Screen     07/02/2023    2:50 PM 06/05/2023    8:25 AM 11/28/2022    8:46 AM 06/12/2022   11:17 AM 06/06/2022    8:12 AM  11/15/2021    8:51 AM 06/08/2021   12:01 PM  PHQ 2/9 Scores  PHQ - 2 Score 0 0 0 0 0 0 0  PHQ- 9 Score 0   0 0 2 4    Fall Risk     07/02/2023    2:41 PM 06/05/2023    8:25 AM 11/28/2022    8:46 AM 06/12/2022   11:16 AM 06/11/2022    6:59 PM  Fall Risk   Falls in the past year? 0 0 0 0 0  Number falls in past yr: 0 0  0   Injury with Fall? 0 0  0   Risk for fall due to : No Fall Risks No Fall Risks  No Fall Risks   Follow up Falls evaluation completed Falls evaluation completed  Falls prevention discussed     MEDICARE RISK AT HOME:  Medicare Risk at Home Any stairs in or around the home?: Yes If so, are there any without handrails?: Yes Home free of loose throw rugs in walkways, pet beds, electrical cords, etc?: Yes Adequate lighting in your home to reduce risk of falls?: Yes Life alert?: No Use of a cane, walker or w/c?: No Grab bars in the bathroom?: No Shower chair or bench in shower?: No Elevated toilet seat or a handicapped toilet?: No  TIMED UP AND GO:  Was the test performed?  no  Cognitive Function: 6CIT completed    11/18/2017    4:01 PM  MMSE - Mini Mental State Exam  Orientation to time 5  Orientation to Place 5  Registration 3  Attention/ Calculation 5  Recall 3  Language- name 2 objects 2  Language- repeat 1  Language- follow 3 step command 3  Language- read & follow direction 1  Write a sentence 1  Copy design 1  Total score 30        07/02/2023    2:51 PM 06/12/2022   11:19 AM 06/08/2021   12:06 PM 11/26/2018    9:41 AM  6CIT Screen  What Year? 0 points 0 points 0 points 0 points  What month?  0 points 0 points 0 points 0 points  What time? 0 points 0 points 0 points 0 points  Count back from 20 0 points 0 points 0 points 0 points  Months in reverse 0 points 0 points 0 points 0 points  Repeat phrase 0 points 0 points 0 points 2 points  Total Score 0 points 0 points 0 points 2 points    Immunizations Immunization History  Administered  Date(s) Administered   Tdap 08/23/2020    Screening Tests Health Maintenance  Topic Date Due   Pneumonia Vaccine 64+ Years old (1 of 1 - PCV) Never done   DEXA SCAN  05/23/2021   Zoster Vaccines- Shingrix (1 of 2) 11/28/2023 (Originally 11/17/1996)   MAMMOGRAM  12/06/2023 (Originally 03/30/2021)   COVID-19 Vaccine (1 - 2024-25 season) 07/17/2024 (Originally 10/14/2022)   INFLUENZA VACCINE  09/13/2023   Medicare Annual Wellness (AWV)  07/01/2024   DTaP/Tdap/Td (2 - Td or Tdap) 08/24/2030   Hepatitis C Screening  Completed   HPV VACCINES  Aged Out   Meningococcal B Vaccine  Aged Out   Colonoscopy  Discontinued    Health Maintenance  Health Maintenance Due  Topic Date Due   Pneumonia Vaccine 52+ Years old (1 of 1 - PCV) Never done   DEXA SCAN  05/23/2021   Health Maintenance Items Addressed: See Nurse Notes  Additional Screening:  Vision Screening: Recommended annual ophthalmology exams for early detection of glaucoma and other disorders of the eye.  Dental Screening: Recommended annual dental exams for proper oral hygiene  Community Resource Referral / Chronic Care Management: CRR required this visit?  No   CCM required this visit?  No   Plan:    I have personally reviewed and noted the following in the patient's chart:   Medical and social history Use of alcohol, tobacco or illicit drugs  Current medications and supplements including opioid prescriptions. Patient is not currently taking opioid prescriptions. Functional ability and status Nutritional status Physical activity Advanced directives List of other physicians Hospitalizations, surgeries, and ER visits in previous 12 months Vitals Screenings to include cognitive, depression, and falls Referrals and appointments  In addition, I have reviewed and discussed with patient certain preventive protocols, quality metrics, and best practice recommendations. A written personalized care plan for preventive services  as well as general preventive health recommendations were provided to patient.   Michaelle Adolphus, CMA   07/02/2023   After Visit Summary: (MyChart) Due to this being a telephonic visit, the after visit summary with patients personalized plan was offered to patient via MyChart   Notes: Nothing significant to report at this time.

## 2023-07-03 NOTE — Telephone Encounter (Signed)
 Please make an appointment so we can discus kidney abnormality on chest CT and further actions needed.

## 2023-07-12 ENCOUNTER — Ambulatory Visit: Admitting: Family Medicine

## 2023-07-12 ENCOUNTER — Encounter: Payer: Self-pay | Admitting: Family Medicine

## 2023-07-12 VITALS — BP 133/83 | HR 82 | Ht 70.0 in | Wt 206.0 lb

## 2023-07-12 DIAGNOSIS — N289 Disorder of kidney and ureter, unspecified: Secondary | ICD-10-CM

## 2023-07-12 DIAGNOSIS — N1330 Unspecified hydronephrosis: Secondary | ICD-10-CM

## 2023-07-12 NOTE — Progress Notes (Signed)
 BP 133/83   Pulse 82   Ht 5\' 10"  (1.778 m)   Wt 206 lb (93.4 kg)   SpO2 96%   BMI 29.56 kg/m    Subjective:   Patient ID: Alison Garrett, female    DOB: 1945-12-09, 78 y.o.   MRN: [id number]  HPI: Alison Garrett is a 78 y.o. female presenting on 07/12/2023 for No chief complaint on file.   HPI Abnormal results on CT scan Patient had a CT scan looking for pulmonary embolism which came back normal for the pulmonary embolism but found another finding on it with the left kidney showing mild left hydronephrosis and an ill-defined hypoattenuating lesion on the right kidney.  She denies any urinary symptoms or urinary problems.  Relevant past medical, surgical, family and social history reviewed and updated as indicated. Interim medical history since our last visit reviewed. Allergies and medications reviewed and updated.  Review of Systems  Constitutional:  Negative for chills and fever.  HENT:  Negative for congestion.   Eyes:  Negative for visual disturbance.  Respiratory:  Negative for chest tightness and shortness of breath.   Cardiovascular:  Negative for chest pain and leg swelling.  Genitourinary:  Negative for difficulty urinating and dysuria.  Musculoskeletal:  Negative for back pain and gait problem.  Skin:  Negative for rash.  Neurological:  Negative for dizziness, light-headedness and headaches.  Psychiatric/Behavioral:  Negative for agitation and behavioral problems.   All other systems reviewed and are negative.   Per HPI unless specifically indicated above   Allergies as of 07/12/2023       Reactions   Ciprofloxacin  Shortness Of Breath, Rash   Contrast Media [iodinated Contrast Media] Anaphylaxis   Diltiazem  Other (See Comments)   Pt. Had pain, jitters and felt terrible   Metoprolol  Nausea And Vomiting   Penicillins Anaphylaxis   Has patient had a PCN reaction causing immediate rash, facial/tongue/throat swelling, SOB or lightheadedness with hypotension:  Noyes Has patient had a PCN reaction causing severe rash involving mucus membranes or skin necrosis: Noyes Has patient had a PCN reaction that required hospitalization Nounknown Has patient had a PCN reaction occurring within the last 10 years: Nono If all of the above answers are "NO", then may proceed with Cephalo   Nitrofuran Derivatives Nausea And Vomiting   Sulfonamide Derivatives Hives   Prednisone  Other (See Comments)   Depression Per patient, "It made me sick on the stomach"   Benicar  [olmesartan ] Rash   Celebrex [celecoxib] Anxiety   Cymbalta [duloxetine Hcl] Itching   Latex Rash   Neurontin [gabapentin] Nausea Only   Norvasc [amlodipine Besylate] Swelling   Prozac [fluoxetine Hcl] Other (See Comments)   Xanax [alprazolam] Other (See Comments)   Zoloft [sertraline Hcl] Itching   Zyprexa [olanzapine] Other (See Comments)   Wt gain        Medication List        Accurate as of Jul 12, 2023 11:08 AM. If you have any questions, ask your nurse or doctor.          STOP taking these medications    diphenhydrAMINE  50 MG tablet Commonly known as: BENADRYL  Stopped by: Lucio Sabin Rileyann Florance   predniSONE  50 MG tablet Commonly known as: DELTASONE  Stopped by: Lucio Sabin Joseangel Nettleton       TAKE these medications    aspirin  EC 81 MG tablet Take 81 mg by mouth daily. Swallow whole.   hydrochlorothiazide  25 MG tablet Commonly known as: HYDRODIURIL  Take 1 tablet (  25 mg total) by mouth daily.         Objective:   BP 133/83   Pulse 82   Ht 5\' 10"  (1.778 m)   Wt 206 lb (93.4 kg)   SpO2 96%   BMI 29.56 kg/m   Wt Readings from Last 3 Encounters:  07/12/23 206 lb (93.4 kg)  07/02/23 205 lb (93 kg)  06/26/23 205 lb (93 kg)    Physical Exam Vitals and nursing note reviewed.  Constitutional:      Appearance: Normal appearance. She is obese.  Neurological:     Mental Status: She is alert.       Assessment & Plan:   Problem List Items Addressed This Visit    None Visit Diagnoses       Hydronephrosis of left kidney    -  Primary   Relevant Orders   US  Renal     Renal lesion       Relevant Orders   US  Renal     Will order renal ultrasound based on abnormal CT scan results and follow-up from there. If need referral in the future we may send her to urology  Follow up plan: Return if symptoms worsen or fail to improve.  Counseling provided for all of the vaccine components Orders Placed This Encounter  Procedures   US  Renal    Jolyne Needs, MD [facility] 07/12/2023, 11:08 AM

## 2023-07-31 ENCOUNTER — Ambulatory Visit (HOSPITAL_BASED_OUTPATIENT_CLINIC_OR_DEPARTMENT_OTHER)
Admission: RE | Admit: 2023-07-31 | Discharge: 2023-07-31 | Disposition: A | Discharge status: Home / Self Care | Source: Ambulatory Visit | Attending: Family Medicine | Admitting: Family Medicine

## 2023-07-31 DIAGNOSIS — N1330 Unspecified hydronephrosis: Secondary | ICD-10-CM | POA: Insufficient documentation

## 2023-07-31 DIAGNOSIS — N289 Disorder of kidney and ureter, unspecified: Secondary | ICD-10-CM | POA: Diagnosis not present

## 2023-07-31 DIAGNOSIS — N281 Cyst of kidney, acquired: Secondary | ICD-10-CM | POA: Diagnosis not present

## 2023-08-15 ENCOUNTER — Ambulatory Visit: Payer: Self-pay | Admitting: Family Medicine

## 2023-10-07 ENCOUNTER — Emergency Department (HOSPITAL_COMMUNITY)

## 2023-10-07 ENCOUNTER — Emergency Department (HOSPITAL_COMMUNITY)
Admission: EM | Admit: 2023-10-07 | Discharge: 2023-10-07 | Disposition: A | Discharge status: Home / Self Care | Attending: Emergency Medicine | Admitting: Emergency Medicine

## 2023-10-07 ENCOUNTER — Encounter (HOSPITAL_COMMUNITY): Payer: Self-pay

## 2023-10-07 ENCOUNTER — Other Ambulatory Visit: Payer: Self-pay

## 2023-10-07 DIAGNOSIS — Z91040 Latex allergy status: Secondary | ICD-10-CM | POA: Insufficient documentation

## 2023-10-07 DIAGNOSIS — Z7982 Long term (current) use of aspirin: Secondary | ICD-10-CM | POA: Diagnosis not present

## 2023-10-07 DIAGNOSIS — Z79899 Other long term (current) drug therapy: Secondary | ICD-10-CM | POA: Insufficient documentation

## 2023-10-07 DIAGNOSIS — R0609 Other forms of dyspnea: Secondary | ICD-10-CM | POA: Insufficient documentation

## 2023-10-07 DIAGNOSIS — I10 Essential (primary) hypertension: Secondary | ICD-10-CM | POA: Diagnosis not present

## 2023-10-07 DIAGNOSIS — R079 Chest pain, unspecified: Secondary | ICD-10-CM | POA: Diagnosis not present

## 2023-10-07 DIAGNOSIS — R0789 Other chest pain: Secondary | ICD-10-CM | POA: Diagnosis not present

## 2023-10-07 DIAGNOSIS — R0602 Shortness of breath: Secondary | ICD-10-CM | POA: Diagnosis not present

## 2023-10-07 LAB — TROPONIN I (HIGH SENSITIVITY)
Troponin I (High Sensitivity): 9 ng/L (ref <18)
Troponin I (High Sensitivity): 9 ng/L (ref <18)

## 2023-10-07 LAB — CBC
HCT: 43.2 % (ref 36.0–46.0)
Hemoglobin: 14.8 g/dL (ref 12.0–15.0)
MCH: 29.8 pg (ref 26.0–34.0)
MCHC: 34.3 g/dL (ref 30.0–36.0)
MCV: 86.9 fL (ref 80.0–100.0)
Platelets: 284 K/uL (ref 150–400)
RBC: 4.97 MIL/uL (ref 3.87–5.11)
RDW: 13.1 % (ref 11.5–15.5)
WBC: 7.4 K/uL (ref 4.0–10.5)
nRBC: 0 % (ref 0.0–0.2)

## 2023-10-07 LAB — BASIC METABOLIC PANEL WITH GFR
Anion gap: 11 (ref 5–15)
BUN: 8 mg/dL (ref 8–23)
CO2: 28 mmol/L (ref 22–32)
Calcium: 9.4 mg/dL (ref 8.9–10.3)
Chloride: 102 mmol/L (ref 98–111)
Creatinine, Ser: 1.07 mg/dL — ABNORMAL HIGH (ref 0.44–1.00)
GFR, Estimated: 54 mL/min — ABNORMAL LOW (ref >60)
Glucose, Bld: 144 mg/dL — ABNORMAL HIGH (ref 70–99)
Potassium: 3.8 mmol/L (ref 3.5–5.1)
Sodium: 141 mmol/L (ref 135–145)

## 2023-10-07 LAB — D-DIMER, QUANTITATIVE: D-Dimer, Quant: 0.59 ug{FEU}/mL — ABNORMAL HIGH (ref 0.00–0.50)

## 2023-10-07 MED ORDER — NITROGLYCERIN 0.4 MG SL SUBL
0.4000 mg | SUBLINGUAL_TABLET | SUBLINGUAL | Status: DC | PRN
Start: 2023-10-07 — End: 2023-10-07

## 2023-10-07 MED ORDER — ASPIRIN 81 MG PO CHEW
162.0000 mg | CHEWABLE_TABLET | Freq: Once | ORAL | Status: AC
  Administered 2023-10-07: 162 mg via ORAL
  Filled 2023-10-07: qty 2

## 2023-10-07 NOTE — Discharge Instructions (Signed)
 Alison Garrett  Thank you for allowing us  to take care of you today.  You came to the Emergency Department today because you had chest pain and chest heaviness as well as shortness of breath overnight and this morning after having a choking episode on your salad last night.  Here in the emergency department your exam is reassuring, and your exam, chest x-ray, and labs do not show any emergency cause of your symptoms such as a heart attack, collapsed lung, infection of the lung, blood clot, etc.  You are feeling better in the emergency department.  It is likely that your symptoms are related to your choking episode.  We recommend following up with your primary care doctor in approximately 1 week to make sure that you are continuing to do well.  To-Do: 1. Please follow-up with your primary doctor within 1 week / as soon as possible.   Please return to the Emergency Department or call 911 if you experience have worsening of your symptoms, recurrent or different chest pain, shortness of breath, severe or significantly worsening pain, high fever, severe confusion, pass out or have any reason to think that you need emergency medical care.   We hope you feel better soon.   Mitzie Later, MD Department of Emergency Medicine [facility]

## 2023-10-07 NOTE — ED Notes (Signed)
 CCMD called. Pt placed on monitor.

## 2023-10-07 NOTE — ED Triage Notes (Signed)
 Pt reports stabbing CP that radiates to both shoulders that began last night immediately after choking on her food. Pt reports went to bed and woke up with CP that feels like pressure. Pt also reporting SHOB with exertion.

## 2023-10-07 NOTE — ED Provider Notes (Signed)
 [place] [facility] Provider Note   CSN: [id number] Arrival date & time: 10/07/23  9170     Patient presents with: Chest Pain and Shortness of Breath   Alison Garrett is a 78 y.o. female with past medical history pertinent for atrial fibrillation status post prior ablation not on anticoagulation, HTN, dyslipidemia, fibromyalgia, GERD, hiatal hernia, elevated BMI who presents for chest pain and shortness of breath.  Patient reports that she initially developed chest pain during the evening of 8/24 while eating a salad with a vinegar based dressing.  Reports that she got choked and had a profuse coughing episode resulting in chest pain radiating to her bilateral shoulders with a sharp stabbing character.  This was associated with shortness of breath.  Her symptoms partially improved after she stopped coughing, however she woke up overnight with some shortness of breath and had generalized malaise.  This morning she had persistent heart heaviness, which does not radiate to her back, neck, upper extremities bilaterally.  This was associated with shortness of breath.  Shortness of breath worsens with exertion, chest discomfort did not worsen with exertion.  Reports that her blood pressure was elevated at home and she took her morning hydrochlorothiazide  as well as 162 mg ASA.  However she reports that she has had persistent of her symptoms, therefore she came to the emergency department for further evaluation.  Reports that she is not currently on any medication for atrial fibrillation since her remote ablation.  Reports that she has had intermittent episodes that feel like she is briefly in atrial fibrillation since the ablation, however has not had any recently.      Prior to Admission medications   Medication Sig Start Date End Date Taking? Authorizing Provider  aspirin  81 MG EC tablet Take 81 mg by mouth daily. Swallow whole.   Yes [provider]   hydrochlorothiazide  (HYDRODIURIL ) 25 MG tablet Take 1 tablet (25 mg total) by mouth daily. 06/05/23  Yes Dettinger, Fonda LABOR, MD    Allergies: Ciprofloxacin , Contrast media [iodinated contrast media], Diltiazem , Metoprolol , Penicillins, Nitrofuran derivatives, Sulfonamide derivatives, Prednisone , Benicar  [olmesartan ], Celebrex [celecoxib], Cymbalta [duloxetine hcl], Latex, Neurontin [gabapentin], Norvasc [amlodipine besylate], Prozac [fluoxetine hcl], Xanax [alprazolam], Zoloft [sertraline hcl], and Zyprexa [olanzapine]    Review of Systems  Respiratory:  Positive for cough, choking and shortness of breath.   Cardiovascular:  Positive for chest pain.    Updated Vital Signs BP (!) 136/96 (BP Location: Right Arm)   Pulse 67   Temp 98.6 F (37 C)   Resp 16   Ht 5' 10 (1.778 m)   Wt 93 kg   SpO2 97%   BMI 29.41 kg/m   Physical Exam  (all labs ordered are listed, but only abnormal results are displayed) Labs Reviewed  BASIC METABOLIC PANEL WITH GFR - Abnormal; Notable for the following components:      Result Value   Glucose, Bld 144 (*)    Creatinine, Ser 1.07 (*)    GFR, Estimated 54 (*)    All other components within normal limits  D-DIMER, QUANTITATIVE - Abnormal; Notable for the following components:   D-Dimer, Quant 0.59 (*)    All other components within normal limits  CBC  TROPONIN I (HIGH SENSITIVITY)  TROPONIN I (HIGH SENSITIVITY)    EKG: EKG Interpretation Date/Time:  Monday October 07 2023 08:45:06 EDT Ventricular Rate:  77 PR Interval:  201 QRS Duration:  102 QT Interval:  398 QTC Calculation: 451  R Axis:   4  Text Interpretation: Sinus rhythm Nonspecific T wave abnormalities, diffuse leads No significant changes on comparison to prior EKG from Jun 26, 2023 Confirmed by Rogelia Satterfield ([id number]) on 10/07/2023 10:40:56 AM  Radiology: DG Chest Portable 1 View Result Date: 10/07/2023 CLINICAL DATA:  Chest pain and shortness of breath. EXAM: PORTABLE CHEST 1  VIEW COMPARISON:  04/01/2021 and CT chest 07/01/2023. FINDINGS: Trachea is midline. Heart size normal. Lungs are clear. No pleural fluid. IMPRESSION: No acute findings. Electronically Signed   By: Newell Eke M.D.   On: 10/07/2023 10:27    Procedures   Medications Ordered in the ED  nitroGLYCERIN  (NITROSTAT ) SL tablet 0.4 mg (has no administration in time range)  aspirin  chewable tablet 162 mg (162 mg Oral Given 10/07/23 0858)    Clinical Course as of 10/07/23 1604  Mon Oct 07, 2023  0950 Patient reports improvement on reevaluation [LS]    Clinical Course User Index [LS] Rogelia Satterfield RAMAN, MD           HEART Score: 4                    Medical Decision Making VALLERIE HENTZ is a 78 y.o. female who presents for chest pain and SOB as per above.  Physical exam is pertinent for no focal abnormalities.   The differential includes but is not limited to, ACS, arrhythmia, pericardial tamponade, pericarditis, myocarditis, pneumonia, pneumothorax, esophageal, tear, perforated abdominal viscous, pulmonary embolism, aortic dissection, costochondritis, musculoskeletal chest wall pain, GERD.    Additional history obtained from: Daughter at bedside,  External records from outside source obtained and reviewed including: Prior EKG  ED provider interpretation of ECG: Rate 77, sinus rhythm, no ST elevations or depressions, nonspecific T wave flattening in leads V1, V3, V4, III, aVF.  Overall similar in comparison to prior EKGs from Jun 26, 2023  Interventions: Aspirin   See the EMR for full details regarding lab and imaging results.  Ms. Mcmurphy is awake, alert, and HDS.  Her exam is most notable for no focal abnormalities.  Aspirin  has been given.  The ECG reveals no anatomical ischemia representing STEMI, New-Onset Arrhythmia, or ischemic equivalent.  She has been risk stratified with a HEAR score of 4. Initial troponin is 9; delta troponin is 9.  The patient's presentation, the patient  being hemodynamically stable, and the ECG are not consistent with Pericardial Tamponade. The patient's pain is not positional. This in conjunction with the lack of PR depressions and ST elevations on the ECG are reassuring against Pericarditis. The patient's non-elevated troponin and ECG are also inconsistent with Myocarditis.  The CXR is unremarkable for focal airspace disease.  The patient is afebrile and denies productive cough.  Therefore, I do not suspect Pneumonia. There is no evidence of Pneumothorax on physical exam or on the CXR. CXR shows no evidence of Esophageal Tear and there is no recent intractable emesis or esophageal instrumentation. There is no peritonitis or free air on CXR worrisome for a Perforated Abdominal Viscous.  Pulmonary Embolism is on the differential. The patient is at  risk via the Revised Geneva Criteria. Therefore, we will further risk stratify the patient with a d-dimer.  This was WNL when adjusted for age. Therefore, a CTA not indicated.  The patient's pain is not tearing and it does not radiate to back. Pulses are present bilaterally in both the upper and lower extremities. CXR does not show a widened mediastinum. I have a very  low suspicion for Aortic Dissection.  On reevaluation after delta troponin, patient reports that she has had resolution of her chest pain and shortness of breath.  Discussed that symptoms are unlikely related to underlying emergency etiology given above reassuring workup, discussed that transient pain was likely related to choking/coughing episode last night.  Recommended close follow-up with primary care physician and return precautions for any recurrent symptoms.  Patient and daughter at bedside expressed understanding and were amenable to this plan.  Consults: Not indicated   MDM generated using voice dictation software and may contain dictation errors.  Please contact me for any clarification or with any questions.      Amount and/or  Complexity of Data Reviewed External Data Reviewed: ECG. Labs: ordered.    Details: CBC without leukocytosis, anemia, thrombocytopenia.  BMP without emergent electrolyte derangement, creatinine at baseline.  D-dimer elevated, however WNL with age adjustment, similar in comparison to prior D-dimers.  Initial troponin 9, delta 9. Radiology: ordered.    Details: Chest x-ray without focal airspace opacification, cardiomediastinal silhouette tension, pneumothorax, pleural effusion, bony derangement  Risk OTC drugs. Prescription drug management.      Final diagnoses:  Dyspnea on exertion  Chest pain, unspecified type    ED Discharge Orders     None          Rogelia Jerilynn RAMAN, MD 10/07/23 [phone]

## 2023-11-14 ENCOUNTER — Encounter: Payer: Self-pay | Admitting: Family Medicine

## 2023-11-14 ENCOUNTER — Ambulatory Visit: Admitting: Family Medicine

## 2023-11-14 ENCOUNTER — Ambulatory Visit: Payer: Self-pay | Admitting: Family Medicine

## 2023-11-14 VITALS — BP 132/85 | HR 77 | Temp 96.6°F | Ht 70.0 in | Wt 201.0 lb

## 2023-11-14 DIAGNOSIS — R5383 Other fatigue: Secondary | ICD-10-CM

## 2023-11-14 DIAGNOSIS — R631 Polydipsia: Secondary | ICD-10-CM | POA: Diagnosis not present

## 2023-11-14 DIAGNOSIS — R079 Chest pain, unspecified: Secondary | ICD-10-CM | POA: Diagnosis not present

## 2023-11-14 DIAGNOSIS — R0609 Other forms of dyspnea: Secondary | ICD-10-CM

## 2023-11-14 LAB — URINALYSIS, COMPLETE
Bilirubin, UA: NEGATIVE
Glucose, UA: NEGATIVE
Ketones, UA: NEGATIVE
Nitrite, UA: NEGATIVE
Protein,UA: NEGATIVE
Specific Gravity, UA: 1.01 (ref 1.005–1.030)
Urobilinogen, Ur: 0.2 mg/dL (ref 0.2–1.0)
pH, UA: 7 (ref 5.0–7.5)

## 2023-11-14 LAB — MICROSCOPIC EXAMINATION
Renal Epithel, UA: NONE SEEN /HPF
Yeast, UA: NONE SEEN

## 2023-11-14 LAB — BAYER DCA HB A1C WAIVED: HB A1C (BAYER DCA - WAIVED): 6.3 % — ABNORMAL HIGH (ref 4.8–5.6)

## 2023-11-14 NOTE — Progress Notes (Signed)
 BP 132/85   Pulse 77   Temp (!) 96.6 F (35.9 C) (Temporal)   Ht 5' 10 (1.778 m)   Wt 201 lb (91.2 kg)   SpO2 97%   BMI 28.84 kg/m    Subjective:   Patient ID: Alison Garrett, female    DOB: 1945/10/17, 78 y.o.   MRN: [id number]  HPI: Alison Garrett is a 78 y.o. female presenting on 11/14/2023 for Hospitalization Follow-up   Discussed the use of AI scribe software for clinical note transcription with the patient, who gave verbal consent to proceed.  History of Present Illness   Alison Garrett is a 78 year old female with Sjogren's syndrome who presents for ER follow-up after experiencing exertional shortness of breath and chest pain.  Exertional dyspnea and chest pain - Experienced episodes of exertional shortness of breath and chest pain on October 07, 2023 - Chest pain described as a short, stabbing sensation in the upper chest, throat, and esophagus - Symptoms resolved in the emergency department after evaluation - Cardiac workup, x-ray, EKG, blood work, and D-dimer were all normal  Dysphagia and choking episodes - Episodes of choking on food occurred prior to the emergency room visit  Fatigue and generalized weakness - Extreme fatigue since the emergency room visit - Weakness when lifting objects, such as feeling shaky when lifting a bag of dog food  Polyuria and nocturnal thirst - Frequent urination since the emergency room visit - Wakes up at night with dry mouth and burning tongue - Significant nocturnal thirst with urgent need to drink water, but difficulty maintaining hydration - No burning, pain, or blood in urine  Oral and mucosal dryness - Persistent dry lips - Dry mouth and burning tongue, especially at night - No sinus congestion or nasal symptoms - Does not believe she is a mouth breather  Dehydration and syncope - History of Sjogren's syndrome with prior hospitalizations for dehydration - Previous episode of severe dehydration at a baseball game  requiring hospital intervention - Near syncope associated with dehydration  Adverse food reaction - Recent adverse reaction to chocolate almond milk, resulting in feeling 'jerky' and unwell - Tolerates chocolate in other forms and drinks a can of Dr. Nunzio daily without issues - Does not drink coffee due to inability to add milk, which affects taste  Concern for diabetes mellitus - Concerned about diabetes due to current symptoms and family history - Sister diagnosed with diabetes following urinary symptoms          Relevant past medical, surgical, family and social history reviewed and updated as indicated. Interim medical history since our last visit reviewed. Allergies and medications reviewed and updated.  Review of Systems  Constitutional:  Positive for fatigue. Negative for chills, fever and unexpected weight change.  HENT:  Negative for congestion, ear discharge and ear pain.   Eyes:  Negative for redness and visual disturbance.  Respiratory:  Negative for chest tightness and shortness of breath.   Cardiovascular:  Negative for chest pain and leg swelling.  Gastrointestinal:  Negative for abdominal pain, blood in stool, constipation, diarrhea, nausea and vomiting.  Endocrine: Positive for polydipsia and polyuria.  Genitourinary:  Negative for difficulty urinating, dysuria, flank pain, frequency and urgency.  Musculoskeletal:  Negative for back pain and gait problem.  Skin:  Negative for rash.  Neurological:  Negative for light-headedness and headaches.  Psychiatric/Behavioral:  Negative for agitation and behavioral problems.   All other systems reviewed and are negative.  Per HPI unless specifically indicated above   Allergies as of 11/14/2023       Reactions   Ciprofloxacin  Shortness Of Breath, Rash   Contrast Media [iodinated Contrast Media] Anaphylaxis   Diltiazem  Other (See Comments)   Pt. Had pain, jitters and felt terrible   Metoprolol  Nausea And Vomiting    Penicillins Anaphylaxis   Has patient had a PCN reaction causing immediate rash, facial/tongue/throat swelling, SOB or lightheadedness with hypotension: Noyes Has patient had a PCN reaction causing severe rash involving mucus membranes or skin necrosis: Noyes Has patient had a PCN reaction that required hospitalization Nounknown Has patient had a PCN reaction occurring within the last 10 years: Nono If all of the above answers are NO, then may proceed with Cephalo   Nitrofuran Derivatives Nausea And Vomiting   Sulfonamide Derivatives Hives   Prednisone  Other (See Comments)   Depression Per patient, It made me sick on the stomach   Benicar  [olmesartan ] Rash   Celebrex [celecoxib] Anxiety   Cymbalta [duloxetine Hcl] Itching   Latex Rash   Neurontin [gabapentin] Nausea Only   Norvasc [amlodipine Besylate] Swelling   Prozac [fluoxetine Hcl] Other (See Comments)   Xanax [alprazolam] Other (See Comments)   Zoloft [sertraline Hcl] Itching   Zyprexa [olanzapine] Other (See Comments)   Wt gain        Medication List        Accurate as of November 14, 2023  1:24 PM. If you have any questions, ask your nurse or doctor.          aspirin  EC 81 MG tablet Take 81 mg by mouth daily. Swallow whole.   hydrochlorothiazide  25 MG tablet Commonly known as: HYDRODIURIL  Take 1 tablet (25 mg total) by mouth daily.         Objective:   BP 132/85   Pulse 77   Temp (!) 96.6 F (35.9 C) (Temporal)   Ht 5' 10 (1.778 m)   Wt 201 lb (91.2 kg)   SpO2 97%   BMI 28.84 kg/m   Wt Readings from Last 3 Encounters:  11/14/23 201 lb (91.2 kg)  10/07/23 205 lb (93 kg)  07/12/23 206 lb (93.4 kg)    Physical Exam Vitals and nursing note reviewed.  HENT:     Mouth/Throat:     Mouth: Mucous membranes are moist.     Pharynx: Oropharynx is clear. No oropharyngeal exudate or posterior oropharyngeal erythema.  Abdominal:     General: Abdomen is flat. Bowel sounds are normal. There is no  distension.     Palpations: Abdomen is soft.     Tenderness: There is no abdominal tenderness.     Hernia: No hernia is present.  Musculoskeletal:        General: No swelling.  Skin:    Findings: No rash.    Physical Exam   VITALS: BP- 100/100 GENERAL: No acute distress. CHEST: Lungs clear to auscultation. CARDIOVASCULAR: Regular rate and rhythm, no murmurs. EXTREMITIES: No edema, pulses intact.         Assessment & Plan:   Problem List Items Addressed This Visit   None Visit Diagnoses       Dyspnea on exertion    -  Primary   Relevant Orders   Bayer DCA Hb A1c Waived   CBC with Differential/Platelet   CMP14+EGFR     Increased thirst       Relevant Orders   Bayer DCA Hb A1c Waived   CBC with Differential/Platelet  CMP14+EGFR   Urinalysis, Complete   Urine Culture     Chest pain, unspecified type         Fatigue, unspecified type       Relevant Orders   Bayer DCA Hb A1c Waived   CBC with Differential/Platelet   CMP14+EGFR   Urinalysis, Complete   Urine Culture          Polydipsia and polyuria, evaluation for diabetes Increased thirst and urination suggest possible diabetes. Differential includes diabetes mellitus and Sjogren's syndrome. - Order A1c test to evaluate for diabetes. - Order thyroid  function tests.  Fatigue and weakness, under evaluation Extreme fatigue and weakness may be due to a viral infection. - Encouraged hydration and maintaining energy levels.          Follow up plan: Return if symptoms worsen or fail to improve.  Counseling provided for all of the vaccine components Orders Placed This Encounter  Procedures   Urine Culture   Bayer DCA Hb A1c Waived   CBC with Differential/Platelet   CMP14+EGFR   Urinalysis, Complete    Fonda Levins, MD [facility] 11/14/2023, 1:24 PM

## 2023-11-15 LAB — CBC WITH DIFFERENTIAL/PLATELET
Basophils Absolute: 0.1 x10E3/uL (ref 0.0–0.2)
Basos: 1 %
EOS (ABSOLUTE): 0.2 x10E3/uL (ref 0.0–0.4)
Eos: 3 %
Hematocrit: 46.6 % (ref 34.0–46.6)
Hemoglobin: 15 g/dL (ref 11.1–15.9)
Immature Grans (Abs): 0.1 x10E3/uL (ref 0.0–0.1)
Immature Granulocytes: 1 %
Lymphocytes Absolute: 1.8 x10E3/uL (ref 0.7–3.1)
Lymphs: 23 %
MCH: 29 pg (ref 26.6–33.0)
MCHC: 32.2 g/dL (ref 31.5–35.7)
MCV: 90 fL (ref 79–97)
Monocytes Absolute: 0.7 x10E3/uL (ref 0.1–0.9)
Monocytes: 9 %
Neutrophils Absolute: 5 x10E3/uL (ref 1.4–7.0)
Neutrophils: 63 %
Platelets: 329 x10E3/uL (ref 150–450)
RBC: 5.18 x10E6/uL (ref 3.77–5.28)
RDW: 12.8 % (ref 11.7–15.4)
WBC: 7.9 x10E3/uL (ref 3.4–10.8)

## 2023-11-15 LAB — CMP14+EGFR
ALT: 32 IU/L (ref 0–32)
AST: 32 IU/L (ref 0–40)
Albumin: 4 g/dL (ref 3.8–4.8)
Alkaline Phosphatase: 125 IU/L (ref 49–135)
BUN/Creatinine Ratio: 10 — ABNORMAL LOW (ref 12–28)
BUN: 10 mg/dL (ref 8–27)
Bilirubin Total: 0.6 mg/dL (ref 0.0–1.2)
CO2: 27 mmol/L (ref 20–29)
Calcium: 9.8 mg/dL (ref 8.7–10.3)
Chloride: 95 mmol/L — ABNORMAL LOW (ref 96–106)
Creatinine, Ser: 0.99 mg/dL (ref 0.57–1.00)
Globulin, Total: 3.2 g/dL (ref 1.5–4.5)
Glucose: 110 mg/dL — ABNORMAL HIGH (ref 70–99)
Potassium: 4.3 mmol/L (ref 3.5–5.2)
Sodium: 137 mmol/L (ref 134–144)
Total Protein: 7.2 g/dL (ref 6.0–8.5)
eGFR: 59 mL/min/1.73 — ABNORMAL LOW (ref >59)

## 2023-11-16 LAB — URINE CULTURE

## 2023-11-27 DIAGNOSIS — H43813 Vitreous degeneration, bilateral: Secondary | ICD-10-CM | POA: Diagnosis not present

## 2023-11-27 DIAGNOSIS — H524 Presbyopia: Secondary | ICD-10-CM | POA: Diagnosis not present

## 2023-11-27 DIAGNOSIS — H2513 Age-related nuclear cataract, bilateral: Secondary | ICD-10-CM | POA: Diagnosis not present

## 2023-11-27 DIAGNOSIS — H52223 Regular astigmatism, bilateral: Secondary | ICD-10-CM | POA: Diagnosis not present

## 2023-12-05 ENCOUNTER — Encounter: Payer: Self-pay | Admitting: Family Medicine

## 2023-12-05 ENCOUNTER — Ambulatory Visit: Admitting: Family Medicine

## 2023-12-05 VITALS — BP 134/85 | HR 70 | Ht 70.0 in | Wt 201.0 lb

## 2023-12-05 DIAGNOSIS — E7800 Pure hypercholesterolemia, unspecified: Secondary | ICD-10-CM | POA: Diagnosis not present

## 2023-12-05 DIAGNOSIS — I10 Essential (primary) hypertension: Secondary | ICD-10-CM | POA: Diagnosis not present

## 2023-12-05 DIAGNOSIS — T7819XA Other adverse food reactions, not elsewhere classified, initial encounter: Secondary | ICD-10-CM

## 2023-12-05 DIAGNOSIS — M109 Gout, unspecified: Secondary | ICD-10-CM

## 2023-12-05 DIAGNOSIS — N182 Chronic kidney disease, stage 2 (mild): Secondary | ICD-10-CM | POA: Diagnosis not present

## 2023-12-05 DIAGNOSIS — R7303 Prediabetes: Secondary | ICD-10-CM | POA: Diagnosis not present

## 2023-12-05 DIAGNOSIS — Z91014 Allergy to mammalian meats: Secondary | ICD-10-CM

## 2023-12-05 LAB — BAYER DCA HB A1C WAIVED: HB A1C (BAYER DCA - WAIVED): 6.4 % — ABNORMAL HIGH (ref 4.8–5.6)

## 2023-12-05 NOTE — Progress Notes (Signed)
 BP 134/85   Pulse 70   Ht 5' 10 (1.778 m)   Wt 201 lb (91.2 kg)   SpO2 97%   BMI 28.84 kg/m    Subjective:   Patient ID: Alison Garrett, female    DOB: Jan 01, 1946, 78 y.o.   MRN: [id number]  HPI: Alison Garrett is a 78 y.o. female presenting on 12/05/2023 for Medical Management of Chronic Issues, Chronic Kidney Disease, and Hypertension   Discussed the use of AI scribe software for clinical note transcription with the patient, who gave verbal consent to proceed.  History of Present Illness   Alison Garrett is a 78 year old female with hypertension and Sjogren's syndrome who presents for a six-month follow-up visit.  Blood pressure and antihypertensive therapy - Blood pressure stable at home, typically around 130/80 mmHg - Occasional drops in blood pressure to 117/75 mmHg, attributed to possible dehydration, especially during summer months - Currently taking hydrochlorothiazide  without adverse effects  Sicca symptoms associated with sjogren's syndrome - Persistent dry mouth and other symptoms of dryness, particularly exacerbated during summer - Dryness may contribute to occasional drops in blood pressure  Glycemic status and prediabetes monitoring - Previous hemoglobin A1c of 6.3% - Due for recheck of blood glucose and other laboratory studies, including cholesterol  Gout and uric acid management - No recent gout flares - Managing condition by increasing daily water intake and adhering to dietary recommendations - Interested in monitoring uric acid levels  Musculoskeletal symptoms - No recent arthritis or changes in joint symptoms          Relevant past medical, surgical, family and social history reviewed and updated as indicated. Interim medical history since our last visit reviewed. Allergies and medications reviewed and updated.  Review of Systems  Constitutional:  Negative for chills and fever.  Eyes:  Negative for redness and visual disturbance.  Respiratory:   Negative for chest tightness and shortness of breath.   Cardiovascular:  Negative for chest pain and leg swelling.  Genitourinary:  Negative for difficulty urinating and dysuria.  Musculoskeletal:  Negative for back pain and gait problem.  Skin:  Negative for rash.  Neurological:  Negative for dizziness, light-headedness and headaches.  Psychiatric/Behavioral:  Negative for agitation and behavioral problems.   All other systems reviewed and are negative.   Per HPI unless specifically indicated above   Allergies as of 12/05/2023       Reactions   Ciprofloxacin  Shortness Of Breath, Rash   Contrast Media [iodinated Contrast Media] Anaphylaxis   Diltiazem  Other (See Comments)   Pt. Had pain, jitters and felt terrible   Metoprolol  Nausea And Vomiting   Penicillins Anaphylaxis   Has patient had a PCN reaction causing immediate rash, facial/tongue/throat swelling, SOB or lightheadedness with hypotension: Yesyes Has patient had a PCN reaction causing severe rash involving mucus membranes or skin necrosis: Yesyes Has patient had a PCN reaction that required hospitalization Nounknown Has patient had a PCN reaction occurring within the last 10 years: Nono If all of the above answers are NO, then may proceed with Cephalo   Nitrofuran Derivatives Nausea And Vomiting   Sulfonamide Derivatives Hives   Prednisone  Other (See Comments)   Depression Per patient, It made me sick on the stomach   Benicar  [olmesartan ] Rash   Celebrex [celecoxib] Anxiety   Cymbalta [duloxetine Hcl] Itching   Latex Rash   Neurontin [gabapentin] Nausea Only   Norvasc [amlodipine Besylate] Swelling   Prozac [fluoxetine Hcl] Other (See Comments)  Xanax [alprazolam] Other (See Comments)   Zoloft [sertraline Hcl] Itching   Zyprexa [olanzapine] Other (See Comments)   Wt gain        Medication List        Accurate as of December 05, 2023  8:15 AM. If you have any questions, ask your nurse or doctor.           aspirin  EC 81 MG tablet Take 81 mg by mouth daily. Swallow whole.   hydrochlorothiazide  25 MG tablet Commonly known as: HYDRODIURIL  Take 1 tablet (25 mg total) by mouth daily.         Objective:   BP 134/85   Pulse 70   Ht 5' 10 (1.778 m)   Wt 201 lb (91.2 kg)   SpO2 97%   BMI 28.84 kg/m   Wt Readings from Last 3 Encounters:  12/05/23 201 lb (91.2 kg)  11/14/23 201 lb (91.2 kg)  10/07/23 205 lb (93 kg)    Physical Exam Vitals and nursing note reviewed.  Constitutional:      General: She is not in acute distress.    Appearance: She is well-developed. She is not diaphoretic.  Eyes:     Conjunctiva/sclera: Conjunctivae normal.  Cardiovascular:     Rate and Rhythm: Normal rate and regular rhythm.     Heart sounds: Normal heart sounds. No murmur heard. Pulmonary:     Effort: Pulmonary effort is normal. No respiratory distress.     Breath sounds: Normal breath sounds. No wheezing.  Musculoskeletal:        General: No swelling. Normal range of motion.  Skin:    General: Skin is warm and dry.     Findings: No rash.  Neurological:     Mental Status: She is alert and oriented to person, place, and time.     Coordination: Coordination normal.  Psychiatric:        Behavior: Behavior normal.    Physical Exam   VITALS: BP- 134/85 NECK: Thyroid  normal. CHEST: Lungs clear to auscultation. CARDIOVASCULAR: Heart regular rate and rhythm, no murmurs. EXTREMITIES: No edema in legs, good pulses.         Assessment & Plan:   Problem List Items Addressed This Visit       Cardiovascular and Mediastinum   HYPERTENSION, BENIGN   Relevant Orders   CBC with Differential/Platelet   CMP14+EGFR     Genitourinary   CKD (chronic kidney disease) stage 2, GFR 60-89 ml/min   Relevant Orders   CBC with Differential/Platelet   CMP14+EGFR     Other   Hyperlipemia   Relevant Orders   CBC with Differential/Platelet   CMP14+EGFR   Lipid panel   Prediabetes - Primary    Relevant Orders   Bayer DCA Hb A1c Waived   CBC with Differential/Platelet   CMP14+EGFR   Gout   Relevant Orders   Uric acid   Other Visit Diagnoses       Allergic reaction to alpha-gal       Relevant Orders   Alpha-Gal Panel          Essential hypertension Blood pressure controlled at 134/85 mmHg with hydrochlorothiazide . Occasional drops to 117/75 mmHg likely due to dehydration. - Continue hydrochlorothiazide . - Monitor blood pressure at home regularly. - Increase hydration, especially during summer months. - Report any significant changes in blood pressure.  Prediabetes Previous A1c was 6.3%. Current status to be reassessed with blood work today. - Order A1c test to reassess blood sugar levels.  Pure hypercholesterolemia Cholesterol levels to be reassessed with current blood work. - Order cholesterol panel.  Gout Asymptomatic and well-managed with increased water intake and dietary modifications. - Order uric acid level. - Continue current dietary modifications and hydration.  Sjogren's syndrome Symptoms of dry mouth and dryness, exacerbated in summer. - Increase hydration, especially during summer months.  General Health Maintenance Routine health maintenance discussed as part of the six-month checkup. - Schedule follow-up appointment in six months.          Follow up plan: Return in about 6 months (around 06/04/2024), or if symptoms worsen or fail to improve, for Physical exam and recheck CKD and hypertension.  Counseling provided for all of the vaccine components Orders Placed This Encounter  Procedures   Bayer DCA Hb A1c Waived   CBC with Differential/Platelet   CMP14+EGFR   Lipid panel   Alpha-Gal Panel   Uric acid    Fonda Levins, MD [facility] 12/05/2023, 8:16 AM

## 2023-12-08 LAB — CMP14+EGFR
ALT: 34 IU/L — ABNORMAL HIGH (ref 0–32)
AST: 29 IU/L (ref 0–40)
Albumin: 3.9 g/dL (ref 3.8–4.8)
Alkaline Phosphatase: 124 IU/L (ref 49–135)
BUN/Creatinine Ratio: 11 — ABNORMAL LOW (ref 12–28)
BUN: 12 mg/dL (ref 8–27)
Bilirubin Total: 0.9 mg/dL (ref 0.0–1.2)
CO2: 28 mmol/L (ref 20–29)
Calcium: 9.6 mg/dL (ref 8.7–10.3)
Chloride: 98 mmol/L (ref 96–106)
Creatinine, Ser: 1.07 mg/dL — ABNORMAL HIGH (ref 0.57–1.00)
Globulin, Total: 2.9 g/dL (ref 1.5–4.5)
Glucose: 151 mg/dL — ABNORMAL HIGH (ref 70–99)
Potassium: 4.1 mmol/L (ref 3.5–5.2)
Sodium: 140 mmol/L (ref 134–144)
Total Protein: 6.8 g/dL (ref 6.0–8.5)
eGFR: 53 mL/min/1.73 — ABNORMAL LOW (ref >59)

## 2023-12-08 LAB — CBC WITH DIFFERENTIAL/PLATELET
Basophils Absolute: 0.1 x10E3/uL (ref 0.0–0.2)
Basos: 1 %
EOS (ABSOLUTE): 0.1 x10E3/uL (ref 0.0–0.4)
Eos: 2 %
Hematocrit: 46.4 % (ref 34.0–46.6)
Hemoglobin: 15 g/dL (ref 11.1–15.9)
Immature Grans (Abs): 0 x10E3/uL (ref 0.0–0.1)
Immature Granulocytes: 0 %
Lymphocytes Absolute: 1.4 x10E3/uL (ref 0.7–3.1)
Lymphs: 20 %
MCH: 29.1 pg (ref 26.6–33.0)
MCHC: 32.3 g/dL (ref 31.5–35.7)
MCV: 90 fL (ref 79–97)
Monocytes Absolute: 0.6 x10E3/uL (ref 0.1–0.9)
Monocytes: 8 %
Neutrophils Absolute: 4.9 x10E3/uL (ref 1.4–7.0)
Neutrophils: 69 %
Platelets: 287 x10E3/uL (ref 150–450)
RBC: 5.15 x10E6/uL (ref 3.77–5.28)
RDW: 12.6 % (ref 11.7–15.4)
WBC: 7.1 x10E3/uL (ref 3.4–10.8)

## 2023-12-08 LAB — LIPID PANEL
Chol/HDL Ratio: 5.7 ratio — ABNORMAL HIGH (ref 0.0–4.4)
Cholesterol, Total: 200 mg/dL — ABNORMAL HIGH (ref 100–199)
HDL: 35 mg/dL — ABNORMAL LOW (ref >39)
LDL Chol Calc (NIH): 132 mg/dL — ABNORMAL HIGH (ref 0–99)
Triglycerides: 185 mg/dL — ABNORMAL HIGH (ref 0–149)
VLDL Cholesterol Cal: 33 mg/dL (ref 5–40)

## 2023-12-08 LAB — ALPHA-GAL PANEL
Allergen Lamb IgE: 0.47 kU/L — AB
Beef IgE: 0.54 kU/L — AB
IgE (Immunoglobulin E), Serum: 142 [IU]/mL (ref 6–495)
O215-IgE Alpha-Gal: 0.91 kU/L — AB
Pork IgE: <0.1 kU/L

## 2023-12-08 LAB — URIC ACID: Uric Acid: 10 mg/dL — ABNORMAL HIGH (ref 3.1–7.9)

## 2023-12-11 ENCOUNTER — Ambulatory Visit: Payer: Self-pay | Admitting: Family Medicine

## 2024-06-05 ENCOUNTER — Encounter: Payer: Self-pay | Admitting: Family Medicine
